# Patient Record
Sex: Female | Born: 1960 | Race: White | Hispanic: No | Smoking: Current every day smoker
Health system: Southern US, Community
[De-identification: ages and names within clinical notes are randomized; demographics above are authoritative.]

## PROBLEM LIST (undated history)

## (undated) ENCOUNTER — Emergency Department (HOSPITAL_COMMUNITY)
Admission: EM | Discharge status: Absent without leave | Payer: Self-pay | Source: Home / Self Care | Attending: Emergency Medicine | Admitting: Emergency Medicine

## (undated) DIAGNOSIS — IMO0002 Reserved for concepts with insufficient information to code with codable children: Secondary | ICD-10-CM

## (undated) DIAGNOSIS — M1990 Unspecified osteoarthritis, unspecified site: Secondary | ICD-10-CM

## (undated) DIAGNOSIS — J449 Chronic obstructive pulmonary disease, unspecified: Secondary | ICD-10-CM

## (undated) HISTORY — PX: TUBAL LIGATION: SHX77

---

## 1999-05-22 ENCOUNTER — Emergency Department (HOSPITAL_COMMUNITY): Admission: EM | Admit: 1999-05-22 | Discharge: 1999-05-22 | Payer: Self-pay | Admitting: Emergency Medicine

## 1999-08-07 ENCOUNTER — Encounter: Payer: Self-pay | Admitting: Emergency Medicine

## 1999-08-07 ENCOUNTER — Emergency Department (HOSPITAL_COMMUNITY): Admission: EM | Admit: 1999-08-07 | Discharge: 1999-08-07 | Payer: Self-pay | Admitting: Emergency Medicine

## 1999-09-08 ENCOUNTER — Emergency Department (HOSPITAL_COMMUNITY): Admission: EM | Admit: 1999-09-08 | Discharge: 1999-09-08 | Payer: Self-pay | Admitting: Emergency Medicine

## 2000-07-01 ENCOUNTER — Emergency Department (HOSPITAL_COMMUNITY): Admission: EM | Admit: 2000-07-01 | Discharge: 2000-07-01 | Payer: Self-pay | Admitting: *Deleted

## 2001-01-15 ENCOUNTER — Emergency Department (HOSPITAL_COMMUNITY): Admission: EM | Admit: 2001-01-15 | Discharge: 2001-01-15 | Payer: Self-pay | Admitting: Emergency Medicine

## 2004-03-31 ENCOUNTER — Emergency Department (HOSPITAL_COMMUNITY): Admission: EM | Admit: 2004-03-31 | Discharge: 2004-03-31 | Payer: Self-pay | Admitting: Emergency Medicine

## 2005-06-14 ENCOUNTER — Emergency Department (HOSPITAL_COMMUNITY): Admission: EM | Admit: 2005-06-14 | Discharge: 2005-06-14 | Payer: Self-pay | Admitting: Family Medicine

## 2007-01-21 ENCOUNTER — Emergency Department (HOSPITAL_COMMUNITY): Admission: EM | Admit: 2007-01-21 | Discharge: 2007-01-21 | Payer: Self-pay | Admitting: Emergency Medicine

## 2007-01-22 ENCOUNTER — Emergency Department (HOSPITAL_COMMUNITY): Admission: EM | Admit: 2007-01-22 | Discharge: 2007-01-22 | Payer: Self-pay | Admitting: Emergency Medicine

## 2007-10-21 ENCOUNTER — Emergency Department (HOSPITAL_COMMUNITY): Admission: EM | Admit: 2007-10-21 | Discharge: 2007-10-21 | Payer: Self-pay | Admitting: Emergency Medicine

## 2007-11-10 ENCOUNTER — Emergency Department (HOSPITAL_COMMUNITY): Admission: EM | Admit: 2007-11-10 | Discharge: 2007-11-10 | Payer: Self-pay | Admitting: Emergency Medicine

## 2009-07-25 ENCOUNTER — Emergency Department (HOSPITAL_COMMUNITY): Admission: EM | Admit: 2009-07-25 | Discharge: 2009-07-25 | Payer: Self-pay | Admitting: Emergency Medicine

## 2010-07-01 ENCOUNTER — Encounter: Payer: Self-pay | Admitting: Emergency Medicine

## 2010-09-20 ENCOUNTER — Inpatient Hospital Stay (INDEPENDENT_AMBULATORY_CARE_PROVIDER_SITE_OTHER)
Admission: RE | Admit: 2010-09-20 | Discharge: 2010-09-20 | Disposition: A | Discharge status: Home / Self Care | Payer: Self-pay | Source: Ambulatory Visit | Attending: Emergency Medicine | Admitting: Emergency Medicine

## 2010-09-20 DIAGNOSIS — S335XXA Sprain of ligaments of lumbar spine, initial encounter: Secondary | ICD-10-CM

## 2010-09-20 DIAGNOSIS — M62838 Other muscle spasm: Secondary | ICD-10-CM

## 2011-10-06 ENCOUNTER — Emergency Department (HOSPITAL_COMMUNITY)
Admission: EM | Admit: 2011-10-06 | Discharge: 2011-10-06 | Disposition: A | Discharge status: Home / Self Care | Payer: Self-pay | Attending: Emergency Medicine | Admitting: Emergency Medicine

## 2011-10-06 ENCOUNTER — Encounter (HOSPITAL_COMMUNITY): Payer: Self-pay | Admitting: *Deleted

## 2011-10-06 DIAGNOSIS — F17200 Nicotine dependence, unspecified, uncomplicated: Secondary | ICD-10-CM | POA: Insufficient documentation

## 2011-10-06 DIAGNOSIS — I8390 Asymptomatic varicose veins of unspecified lower extremity: Secondary | ICD-10-CM | POA: Insufficient documentation

## 2011-10-06 LAB — BASIC METABOLIC PANEL
BUN: 11 mg/dL (ref 6–23)
CO2: 28 mEq/L (ref 19–32)
Calcium: 9.7 mg/dL (ref 8.4–10.5)
Chloride: 102 mEq/L (ref 96–112)
Creatinine, Ser: 0.62 mg/dL (ref 0.50–1.10)
GFR calc Af Amer: >90 mL/min (ref >90)
GFR calc non Af Amer: >90 mL/min (ref >90)
Glucose, Bld: 95 mg/dL (ref 70–99)
Potassium: 3.7 mEq/L (ref 3.5–5.1)
Sodium: 138 mEq/L (ref 135–145)

## 2011-10-06 LAB — CBC
HCT: 44.7 % (ref 36.0–46.0)
Hemoglobin: 15.3 g/dL — ABNORMAL HIGH (ref 12.0–15.0)
MCH: 30.1 pg (ref 26.0–34.0)
MCHC: 34.2 g/dL (ref 30.0–36.0)
MCV: 87.8 fL (ref 78.0–100.0)
Platelets: 224 10*3/uL (ref 150–400)
RBC: 5.09 MIL/uL (ref 3.87–5.11)
RDW: 13.7 % (ref 11.5–15.5)
WBC: 9.3 10*3/uL (ref 4.0–10.5)

## 2011-10-06 LAB — GLUCOSE, CAPILLARY: Glucose-Capillary: 86 mg/dL (ref 70–99)

## 2011-10-06 LAB — D-DIMER, QUANTITATIVE: D-Dimer, Quant: 0.36 ug/mL-FEU (ref 0.00–0.48)

## 2011-10-06 MED ORDER — HYDROCODONE-ACETAMINOPHEN 5-325 MG PO TABS: 1.0000 | ORAL_TABLET | Freq: Four times a day (QID) | ORAL | Status: DC | PRN

## 2011-10-06 MED ORDER — OXYCODONE-ACETAMINOPHEN 5-325 MG PO TABS: ORAL_TABLET | ORAL | Status: DC

## 2011-10-06 NOTE — ED Notes (Signed)
Rash to right lower leg and hardened area to lateral right leg (which has been ongoing for years).

## 2011-10-06 NOTE — ED Provider Notes (Signed)
History     CSN: [id number]  Arrival date & time 10/06/11  1738   First MD Initiated Contact with Patient 10/06/11 1812      Chief Complaint  Patient presents with  . Leg Pain    (Consider location/radiation/quality/duration/timing/severity/associated sxs/prior treatment) HPI Comments: Pt had a lesion removed from R lower lateral leg ~ 2 yrs ago.  Has noted an adjacent area of swelling and PT and extreme itching of lower anterior leg.  When the old lesion area begins to hurt she also notes pain radiating to the knee area.  She denies any fever or chills.  No trauma States she can hardly feel anything in the lower anterior portion portion of her leg when she touches it.  No h/o DVT.  Strong family h/o cancer.  Denies CP, SOB or hemoptysis.  Is in process of getting established at [facility].  Patient is a 51 y.o. female presenting with leg pain. The history is provided by the patient. No language interpreter was used.  Leg Pain  Incident onset: several days ago. The incident occurred at home. There was no injury mechanism. Pain location: R lower leg  The pain is severe. The pain has been intermittent since onset. She reports no foreign bodies present. The symptoms are aggravated by palpation. She has tried nothing for the symptoms.    History reviewed. No pertinent past medical history.  Past Surgical History  Procedure Date  . Tubal ligation     No family history on file.  History  Substance Use Topics  . Smoking status: Current Everyday Smoker  . Smokeless tobacco: Not on file  . Alcohol Use: Yes    OB History    Grav Para Term Preterm Abortions TAB SAB Ect Mult Living                  Review of Systems  Constitutional: Negative for fever and chills.       Polydipsia  Respiratory: Negative for cough and shortness of breath.   Cardiovascular: Negative for chest pain.  Genitourinary: Negative for frequency.  All other systems reviewed and are  negative.    Allergies  Aspirin  Home Medications   Current Outpatient Rx  Name Route Sig Dispense Refill  . HYDROCODONE-ACETAMINOPHEN 5-325 MG PO TABS Oral Take 1 tablet by mouth every 6 (six) hours as needed for pain. 20 tablet 0    BP 142/96  Pulse 102  Temp(Src) 98.1 F (36.7 C) (Oral)  Resp 20  Ht 5\' 4"  (1.626 m)  SpO2 100%  Physical Exam  Nursing note and vitals reviewed. Constitutional: She is oriented to person, place, and time. She appears well-developed and well-nourished. No distress.  HENT:  Head: Normocephalic and atraumatic.  Eyes: EOM are normal.  Neck: Normal range of motion.  Cardiovascular: Normal rate, regular rhythm and normal heart sounds.   Pulmonary/Chest: Effort normal and breath sounds normal.  Abdominal: Soft. She exhibits no distension. There is no tenderness.  Musculoskeletal: Normal range of motion.       Legs: Neurological: She is alert and oriented to person, place, and time.  Skin: Skin is warm and dry.  Psychiatric: She has a normal mood and affect. Judgment normal.    ED Course  Procedures (including critical care time)  Labs Reviewed  CBC - Abnormal; Notable for the following:    Hemoglobin 15.3 (*)    All other components within normal limits  BASIC METABOLIC PANEL  D-DIMER, QUANTITATIVE  GLUCOSE, CAPILLARY  No results found.   1. Varicose vein of leg       MDM  No evidence of DVT.  rx for percocet, 20.  F/u with [facility].        Worthy Rancher, PA 10/06/11 2048  Worthy Rancher, PA 10/06/11 [phone]

## 2011-10-06 NOTE — ED Notes (Signed)
Pt a/ox4. Resp even and unlabored. NAD at this time. D/C instructions reviewed with pt. Pt verbalized understanding. Pt ambulated to lobby with steady gate.  

## 2011-10-06 NOTE — Discharge Instructions (Signed)
Apply ice to area several times daily.  Take the pain medicine as directed.  Follow through with getting established at [facility].

## 2011-10-09 NOTE — ED Provider Notes (Signed)
Medical screening examination/treatment/procedure(s) were performed by non-physician practitioner and as supervising physician I was immediately available for consultation/collaboration.  Tameko Halder S. Leidy Massar, MD 10/09/11 1415 

## 2012-01-29 ENCOUNTER — Encounter (HOSPITAL_COMMUNITY): Payer: Self-pay | Admitting: *Deleted

## 2012-01-29 ENCOUNTER — Emergency Department (HOSPITAL_COMMUNITY): Payer: Self-pay

## 2012-01-29 ENCOUNTER — Emergency Department (HOSPITAL_COMMUNITY)
Admission: EM | Admit: 2012-01-29 | Discharge: 2012-01-29 | Disposition: A | Discharge status: Home / Self Care | Payer: Self-pay | Attending: Emergency Medicine | Admitting: Emergency Medicine

## 2012-01-29 DIAGNOSIS — A5909 Other urogenital trichomoniasis: Secondary | ICD-10-CM | POA: Insufficient documentation

## 2012-01-29 DIAGNOSIS — N390 Urinary tract infection, site not specified: Secondary | ICD-10-CM | POA: Insufficient documentation

## 2012-01-29 DIAGNOSIS — A5903 Trichomonal cystitis and urethritis: Secondary | ICD-10-CM

## 2012-01-29 DIAGNOSIS — R109 Unspecified abdominal pain: Secondary | ICD-10-CM

## 2012-01-29 DIAGNOSIS — F17200 Nicotine dependence, unspecified, uncomplicated: Secondary | ICD-10-CM | POA: Insufficient documentation

## 2012-01-29 DIAGNOSIS — R197 Diarrhea, unspecified: Secondary | ICD-10-CM | POA: Insufficient documentation

## 2012-01-29 HISTORY — DX: Reserved for concepts with insufficient information to code with codable children: IMO0002

## 2012-01-29 LAB — COMPREHENSIVE METABOLIC PANEL WITH GFR
ALT: 10 U/L (ref 0–35)
AST: 17 U/L (ref 0–37)
Albumin: 3.8 g/dL (ref 3.5–5.2)
Alkaline Phosphatase: 78 U/L (ref 39–117)
BUN: 10 mg/dL (ref 6–23)
CO2: 28 meq/L (ref 19–32)
Calcium: 9.4 mg/dL (ref 8.4–10.5)
Chloride: 101 meq/L (ref 96–112)
Creatinine, Ser: 0.85 mg/dL (ref 0.50–1.10)
GFR calc Af Amer: >90 mL/min
GFR calc non Af Amer: 78 mL/min — ABNORMAL LOW
Glucose, Bld: 82 mg/dL (ref 70–99)
Potassium: 3.8 meq/L (ref 3.5–5.1)
Sodium: 137 meq/L (ref 135–145)
Total Bilirubin: 0.2 mg/dL — ABNORMAL LOW (ref 0.3–1.2)
Total Protein: 7.3 g/dL (ref 6.0–8.3)

## 2012-01-29 LAB — URINE MICROSCOPIC-ADD ON

## 2012-01-29 LAB — URINALYSIS, ROUTINE W REFLEX MICROSCOPIC
Bilirubin Urine: NEGATIVE
Glucose, UA: NEGATIVE mg/dL
Ketones, ur: NEGATIVE mg/dL
Nitrite: NEGATIVE
Protein, ur: NEGATIVE mg/dL
Specific Gravity, Urine: 1.015 (ref 1.005–1.030)
Urobilinogen, UA: 1 mg/dL (ref 0.0–1.0)
pH: 6.5 (ref 5.0–8.0)

## 2012-01-29 LAB — CBC WITH DIFFERENTIAL/PLATELET
Basophils Absolute: 0 10*3/uL (ref 0.0–0.1)
Basophils Relative: 1 % (ref 0–1)
Eosinophils Absolute: 0.3 10*3/uL (ref 0.0–0.7)
Eosinophils Relative: 4 % (ref 0–5)
HCT: 41.3 % (ref 36.0–46.0)
Hemoglobin: 14.1 g/dL (ref 12.0–15.0)
Lymphocytes Relative: 36 % (ref 12–46)
Lymphs Abs: 2.7 10*3/uL (ref 0.7–4.0)
MCH: 30 pg (ref 26.0–34.0)
MCHC: 34.1 g/dL (ref 30.0–36.0)
MCV: 87.9 fL (ref 78.0–100.0)
Monocytes Absolute: 0.8 10*3/uL (ref 0.1–1.0)
Monocytes Relative: 11 % (ref 3–12)
Neutro Abs: 3.6 10*3/uL (ref 1.7–7.7)
Neutrophils Relative %: 48 % (ref 43–77)
Platelets: 185 10*3/uL (ref 150–400)
RBC: 4.7 MIL/uL (ref 3.87–5.11)
RDW: 13 % (ref 11.5–15.5)
WBC: 7.5 10*3/uL (ref 4.0–10.5)

## 2012-01-29 MED ORDER — ONDANSETRON HCL 4 MG PO TABS: 4.0000 mg | ORAL_TABLET | Freq: Three times a day (TID) | ORAL | Status: AC | PRN

## 2012-01-29 MED ORDER — CEPHALEXIN 500 MG PO CAPS: 500.0000 mg | ORAL_CAPSULE | Freq: Four times a day (QID) | ORAL | Status: AC

## 2012-01-29 MED ORDER — METRONIDAZOLE 500 MG PO TABS
2000.0000 mg | ORAL_TABLET | Freq: Once | ORAL | Status: AC
  Administered 2012-01-29: 2000 mg via ORAL
  Filled 2012-01-29: qty 4

## 2012-01-29 MED ORDER — SODIUM CHLORIDE 0.9 % IV SOLN
INTRAVENOUS | Status: DC
  Administered 2012-01-29: 20:00:00 via INTRAVENOUS

## 2012-01-29 MED ORDER — ONDANSETRON HCL 4 MG/2ML IJ SOLN
4.0000 mg | INTRAMUSCULAR | Status: DC | PRN
  Administered 2012-01-29: 4 mg via INTRAVENOUS
  Filled 2012-01-29: qty 2

## 2012-01-29 MED ORDER — FAMOTIDINE IN NACL 20-0.9 MG/50ML-% IV SOLN
20.0000 mg | Freq: Once | INTRAVENOUS | Status: AC
  Administered 2012-01-29: 20 mg via INTRAVENOUS
  Filled 2012-01-29: qty 50

## 2012-01-29 MED ORDER — IOHEXOL 300 MG/ML  SOLN
100.0000 mL | Freq: Once | INTRAMUSCULAR | Status: AC | PRN
  Administered 2012-01-29: 100 mL via INTRAVENOUS

## 2012-01-29 NOTE — ED Notes (Signed)
Pt reporting improvement in abdominal cramping and nausea.  Pt tolerated both bottles of CT contrast with no difficulty.

## 2012-01-29 NOTE — ED Notes (Signed)
Hemoccult test negative.  Physician aware.

## 2012-01-29 NOTE — ED Provider Notes (Signed)
History     CSN: [id number]  Arrival date & time 01/29/12  1732   First MD Initiated Contact with Patient 01/29/12 1918      Chief Complaint  Patient presents with  . Abdominal Pain    HPI Pt was seen at 1945.  Per pt, c/o gradual onset and persistence of constant abd "pain" for the past 3 days.  Describes the pain as diffuse "cramping," especially in her upper abd.  Has been associated with nausea and several intermittent episodes of "watery" diarrhea.  States she has not been eating or drinking because it worsens her upper abd pain, cramping, and diarrhea.  Denies vomiting, no CP/SOB, no back pain, no dysuria, no black or blood in stools, no fevers, no rash, no sick contacts with same, no recent travel or antibiotic use.   Past Medical History  Diagnosis Date  . Ulcer     Past Surgical History  Procedure Date  . Tubal ligation     History  Substance Use Topics  . Smoking status: Current Everyday Smoker  . Smokeless tobacco: Not on file  . Alcohol Use: No    Review of Systems ROS: Statement: All systems negative except as marked or noted in the HPI; Constitutional: Negative for fever and chills. ; ; Eyes: Negative for eye pain, redness and discharge. ; ; ENMT: Negative for ear pain, hoarseness, nasal congestion, sinus pressure and sore throat. ; ; Cardiovascular: Negative for chest pain, palpitations, diaphoresis, dyspnea and peripheral edema. ; ; Respiratory: Negative for cough, wheezing and stridor. ; ; Gastrointestinal: +Nausea, diarrhea, abd pain. Negative for vomiting, blood in stool, hematemesis, jaundice and rectal bleeding. . ; ; Genitourinary: Negative for dysuria, flank pain and hematuria. ; ; Musculoskeletal: Negative for back pain and neck pain. Negative for swelling and trauma.; ; Skin: Negative for pruritus, rash, abrasions, blisters, bruising and skin lesion.; ; Neuro: Negative for headache, lightheadedness and neck stiffness. Negative for weakness, altered level of  consciousness , altered mental status, extremity weakness, paresthesias, involuntary movement, seizure and syncope.     Allergies  Aspirin  Home Medications  No current outpatient prescriptions on file.  BP 101/72  Pulse 82  Temp 98.1 F (36.7 C) (Oral)  Resp 20  Ht 5\' 4"  (1.626 m)  Wt 135 lb (61.236 kg)  BMI 23.17 kg/m2  SpO2 97%  Physical Exam 1950: Physical examination:  Nursing notes reviewed; Vital signs and O2 SAT reviewed;  Constitutional: Well developed, Well nourished, Well hydrated, In no acute distress; Head:  Normocephalic, atraumatic; Eyes: EOMI, PERRL, No scleral icterus; ENMT: Mouth and pharynx normal, Mucous membranes moist; Neck: Supple, Full range of motion, No lymphadenopathy; Cardiovascular: Regular rate and rhythm, No murmur, rub, or gallop; Respiratory: Breath sounds clear & equal bilaterally, No rales, rhonchi, wheezes.  Speaking full sentences with ease, Normal respiratory effort/excursion; Chest: Nontender, Movement normal; Abdomen: Soft, +mild diffuse tenderness to palp.  No rebound or guarding. Nondistended, Normal bowel sounds; Genitourinary: No CVA tenderness; Extremities: Pulses normal, No tenderness, No edema, No calf edema or asymmetry.; Neuro: AA&Ox3, Major CN grossly intact.  Speech clear. No gross focal motor or sensory deficits in extremities.; Skin: Color normal, Warm, Dry.   ED Course  Procedures   MDM  MDM Reviewed: nursing note, vitals and previous chart Interpretation: labs and CT scan   Stool heme negative.  Results for orders placed during the hospital encounter of 01/29/12  CBC WITH DIFFERENTIAL      Component Value Range   WBC  7.5  4.0 - 10.5 K/uL   RBC 4.70  3.87 - 5.11 MIL/uL   Hemoglobin 14.1  12.0 - 15.0 g/dL   HCT 96.0  45.4 - 09.8 %   MCV 87.9  78.0 - 100.0 fL   MCH 30.0  26.0 - 34.0 pg   MCHC 34.1  30.0 - 36.0 g/dL   RDW 11.9  14.7 - 82.9 %   Platelets 185  150 - 400 K/uL   Neutrophils Relative 48  43 - 77 %   Neutro  Abs 3.6  1.7 - 7.7 K/uL   Lymphocytes Relative 36  12 - 46 %   Lymphs Abs 2.7  0.7 - 4.0 K/uL   Monocytes Relative 11  3 - 12 %   Monocytes Absolute 0.8  0.1 - 1.0 K/uL   Eosinophils Relative 4  0 - 5 %   Eosinophils Absolute 0.3  0.0 - 0.7 K/uL   Basophils Relative 1  0 - 1 %   Basophils Absolute 0.0  0.0 - 0.1 K/uL  COMPREHENSIVE METABOLIC PANEL      Component Value Range   Sodium 137  135 - 145 mEq/L   Potassium 3.8  3.5 - 5.1 mEq/L   Chloride 101  96 - 112 mEq/L   CO2 28  19 - 32 mEq/L   Glucose, Bld 82  70 - 99 mg/dL   BUN 10  6 - 23 mg/dL   Creatinine, Ser 5.62  0.50 - 1.10 mg/dL   Calcium 9.4  8.4 - 13.0 mg/dL   Total Protein 7.3  6.0 - 8.3 g/dL   Albumin 3.8  3.5 - 5.2 g/dL   AST 17  0 - 37 U/L   ALT 10  0 - 35 U/L   Alkaline Phosphatase 78  39 - 117 U/L   Total Bilirubin 0.2 (*) 0.3 - 1.2 mg/dL   GFR calc non Af Amer 78 (*) >90 mL/min   GFR calc Af Amer >90  >90 mL/min  LIPASE, BLOOD      Component Value Range   Lipase 14  11 - 59 U/L  URINALYSIS, ROUTINE W REFLEX MICROSCOPIC      Component Value Range   Color, Urine AMBER (*) YELLOW   APPearance CLEAR  CLEAR   Specific Gravity, Urine 1.015  1.005 - 1.030   pH 6.5  5.0 - 8.0   Glucose, UA NEGATIVE  NEGATIVE mg/dL   Hgb urine dipstick TRACE (*) NEGATIVE   Bilirubin Urine NEGATIVE  NEGATIVE   Ketones, ur NEGATIVE  NEGATIVE mg/dL   Protein, ur NEGATIVE  NEGATIVE mg/dL   Urobilinogen, UA 1.0  0.0 - 1.0 mg/dL   Nitrite NEGATIVE  NEGATIVE   Leukocytes, UA SMALL (*) NEGATIVE  URINE MICROSCOPIC-ADD ON      Component Value Range   Squamous Epithelial / LPF MANY (*) RARE   WBC, UA 11-20  <3 WBC/hpf   RBC / HPF 7-10  <3 RBC/hpf   Bacteria, UA FEW (*) RARE   Urine-Other TRICHOMONAS PRESENT     Ct Abdomen Pelvis W Contrast 01/29/2012  *RADIOLOGY REPORT*  Clinical Data: Abdominal pain, cramping, and diarrhea for 3 days. History of ulcer.  CT ABDOMEN AND PELVIS WITH CONTRAST  Technique:  Multidetector CT imaging of the  abdomen and pelvis was performed following the standard protocol during bolus administration of intravenous contrast.  Contrast: OMNIPAQUE IOHEXOL 300 MG/ML  SOLN  Comparison: None.  Findings: Minimal dependent changes in the lung bases.  Mild emphysematous changes in the lung bases.  Small esophageal hiatal hernia.  Small low attenuation lesions in the lateral segment left lobe of liver and posterior right lobe of liver, largest measuring 12 mm diameter.  These are somewhat poorly defined appear to fill in on delayed imaging, likely representing small hemangiomas.  The gallbladder, spleen, pancreas, adrenal glands, and inferior vena cava are unremarkable.  Sub centimeter cysts in the kidneys.  No solid mass or hydronephrosis appreciated.  Calcification of the abdominal aorta without aneurysm.  Moderate prominent retroperitoneal lymph nodes without pathologic enlargement, likely reactive.  The stomach, small bowel, and colon are not abnormally distended and no discrete wall thickening is appreciated. Prominent visceral adipose tissues.  No free air or free fluid in the abdomen.  Pelvis:  Uterus and adnexal structures are not enlarged.  Bladder wall is not thickened.  No free or loculated pelvic fluid collections.  No evidence of diverticulitis.  The appendix is normal.  The mild degenerative changes in the lumbar spine.  IMPRESSION: Mild prominence of mesenteric and retroperitoneal lymph nodes, likely to be reactive.  Small hypodense liver lesions likely representing small hemangiomas.  No acute process demonstrated in the abdomen or pelvis.   Original Report Authenticated By: Marlon Pel, M.D.      2230:  Has tol PO well while in the ED without N/V.  No stooling while in the ED.  States she is better and ready to leave now.  Will tx for trichomonas today; pt encouraged to f/u with [facility] and/or OB/GYN for further STD testing.  Dx testing d/w pt.  Questions answered.  Verb understanding,  agreeable to d/c home with outpt f/u.        Laray Anger, DO 02/01/12 0930

## 2012-01-29 NOTE — ED Notes (Signed)
Pt c/o pain in her abdomen x 3 days. Pt denies nausea and vomiting. States that she is having diarrhea. Denies fever.

## 2012-01-29 NOTE — ED Notes (Signed)
Pt reporting abdominal cramping and diarrhea x4 days.  Reporting mild nausea, no vomiting.

## 2012-01-30 LAB — URINE CULTURE: Colony Count: 40000

## 2012-02-25 ENCOUNTER — Encounter (HOSPITAL_COMMUNITY): Payer: Self-pay | Admitting: Emergency Medicine

## 2012-02-25 ENCOUNTER — Emergency Department (HOSPITAL_COMMUNITY)
Admission: EM | Admit: 2012-02-25 | Discharge: 2012-02-25 | Disposition: A | Discharge status: Home / Self Care | Payer: Self-pay | Attending: Emergency Medicine | Admitting: Emergency Medicine

## 2012-02-25 DIAGNOSIS — Z91018 Allergy to other foods: Secondary | ICD-10-CM | POA: Insufficient documentation

## 2012-02-25 DIAGNOSIS — M549 Dorsalgia, unspecified: Secondary | ICD-10-CM | POA: Insufficient documentation

## 2012-02-25 DIAGNOSIS — F17200 Nicotine dependence, unspecified, uncomplicated: Secondary | ICD-10-CM | POA: Insufficient documentation

## 2012-02-25 DIAGNOSIS — Z888 Allergy status to other drugs, medicaments and biological substances status: Secondary | ICD-10-CM | POA: Insufficient documentation

## 2012-02-25 MED ORDER — HYDROCODONE-ACETAMINOPHEN 5-325 MG PO TABS: ORAL_TABLET | ORAL | Status: DC

## 2012-02-25 MED ORDER — BACLOFEN 10 MG PO TABS: 10.0000 mg | ORAL_TABLET | Freq: Three times a day (TID) | ORAL | Status: AC

## 2012-02-25 NOTE — ED Provider Notes (Signed)
Medical screening examination/treatment/procedure(s) were performed by non-physician practitioner and as supervising physician I was immediately available for consultation/collaboration.  Shelda Jakes, MD 02/25/12 1239

## 2012-02-25 NOTE — ED Provider Notes (Signed)
History     CSN: [id number]  Arrival date & time 02/25/12  1036   First MD Initiated Contact with Patient 02/25/12 1219      Chief Complaint  Patient presents with  . Back Pain    (Consider location/radiation/quality/duration/timing/severity/associated sxs/prior treatment) Patient is a 51 y.o. female presenting with back pain. The history is provided by the patient.  Back Pain  This is a chronic problem. The current episode started more than 2 days ago. The problem occurs daily. The problem has been gradually worsening. Associated with: riding a mower, she hit several roots that jarrd the lower back. The pain is present in the lumbar spine. The quality of the pain is described as aching. The pain is severe. The symptoms are aggravated by certain positions. The pain is the same all the time. Associated symptoms include abdominal pain. Pertinent negatives include no chest pain, no fever, no bowel incontinence, no perianal numbness, no bladder incontinence and no dysuria. She has tried analgesics for the symptoms. The treatment provided no relief.    Past Medical History  Diagnosis Date  . Ulcer     Past Surgical History  Procedure Date  . Tubal ligation     History reviewed. No pertinent family history.  History  Substance Use Topics  . Smoking status: Current Every Day Smoker  . Smokeless tobacco: Not on file  . Alcohol Use: No    OB History    Grav Para Term Preterm Abortions TAB SAB Ect Mult Living                  Review of Systems  Constitutional: Negative for fever and activity change.       All ROS Neg except as noted in HPI  HENT: Negative for nosebleeds and neck pain.   Eyes: Negative for photophobia and discharge.  Respiratory: Positive for wheezing. Negative for cough and shortness of breath.   Cardiovascular: Negative for chest pain and palpitations.  Gastrointestinal: Positive for abdominal pain. Negative for blood in stool and bowel incontinence.    Genitourinary: Negative for bladder incontinence, dysuria, frequency and hematuria.  Musculoskeletal: Positive for back pain. Negative for arthralgias.  Skin: Negative.   Neurological: Negative for dizziness, seizures and speech difficulty.  Psychiatric/Behavioral: Negative for hallucinations and confusion.    Allergies  Aspirin and Mushroom extract complex  Home Medications   Current Outpatient Rx  Name Route Sig Dispense Refill  . ACETAMINOPHEN 500 MG PO TABS Oral Take 1,000 mg by mouth every 6 (six) hours as needed. Pain      BP 111/69  Pulse 84  Temp 97.9 F (36.6 C) (Oral)  Resp 18  Ht 5\' 4"  (1.626 m)  Wt 130 lb (58.968 kg)  BMI 22.31 kg/m2  SpO2 100%  Physical Exam  Nursing note and vitals reviewed. Constitutional: She is oriented to person, place, and time. She appears well-developed and well-nourished.  Non-toxic appearance.  HENT:  Head: Normocephalic.  Right Ear: Tympanic membrane and external ear normal.  Left Ear: Tympanic membrane and external ear normal.  Eyes: EOM and lids are normal. Pupils are equal, round, and reactive to light.  Neck: Normal range of motion. Neck supple. Carotid bruit is not present.  Cardiovascular: Normal rate, regular rhythm, normal heart sounds, intact distal pulses and normal pulses.   Pulmonary/Chest: Breath sounds normal. No respiratory distress.  Abdominal: Soft. Bowel sounds are normal. There is no tenderness. There is no guarding.  Musculoskeletal: Normal range of motion.  The patient has pain in the lower back when attempting to straighten her back. There is minimal discomfort to palpation. There is major discomfort with attempted range of motion particularly of the lumbar region. There is no palpable step off.  Lymphadenopathy:       Head (right side): No submandibular adenopathy present.       Head (left side): No submandibular adenopathy present.    She has no cervical adenopathy.  Neurological: She is alert and  oriented to person, place, and time. She has normal strength. No cranial nerve deficit or sensory deficit. She exhibits normal muscle tone. Coordination normal.       No gross motor or sensory deficits appreciated.  Skin: Skin is warm and dry.  Psychiatric: She has a normal mood and affect. Her speech is normal.    ED Course  Procedures (including critical care time)  Labs Reviewed - No data to display No results found.   No diagnosis found.    MDM  I have reviewed nursing notes, vital signs, and all appropriate lab and imaging results for this patient. Patient has a history of chronic back pain that was exacerbated by doing yard work and by riding a Surveyor, mining over several roots in the neighbors yard. The patient denies any loss of bowel or bladder function. She's not had a recent fall or other injuries. The plan at this time is for the patient to be treated with baclofen 3 times daily, and Norco every 4 hours as needed for pain #20 tablets. Patient has been advised to see her primary physician if not improving.       Kathie Dike, [place] 02/25/12 1231

## 2012-02-25 NOTE — ED Notes (Signed)
Pt c/o of back pain the increases with movement. Denies falling but recalls mowing grass on Sept 15, where there were a lot of "ruts" in the yard. The riding mower went down and popped back up hitting her per patient.  Could not pick up granddaughter this morning when attempting pain shot up back. No hx of back pain or surgeries.

## 2012-03-30 ENCOUNTER — Emergency Department (HOSPITAL_COMMUNITY): Payer: Self-pay

## 2012-03-30 ENCOUNTER — Emergency Department (HOSPITAL_COMMUNITY)
Admission: EM | Admit: 2012-03-30 | Discharge: 2012-03-30 | Disposition: A | Discharge status: Home / Self Care | Payer: Self-pay | Attending: Emergency Medicine | Admitting: Emergency Medicine

## 2012-03-30 ENCOUNTER — Encounter (HOSPITAL_COMMUNITY): Payer: Self-pay | Admitting: *Deleted

## 2012-03-30 DIAGNOSIS — Y939 Activity, unspecified: Secondary | ICD-10-CM | POA: Insufficient documentation

## 2012-03-30 DIAGNOSIS — Y929 Unspecified place or not applicable: Secondary | ICD-10-CM | POA: Insufficient documentation

## 2012-03-30 DIAGNOSIS — S61019A Laceration without foreign body of unspecified thumb without damage to nail, initial encounter: Secondary | ICD-10-CM

## 2012-03-30 DIAGNOSIS — Z23 Encounter for immunization: Secondary | ICD-10-CM | POA: Insufficient documentation

## 2012-03-30 DIAGNOSIS — F17200 Nicotine dependence, unspecified, uncomplicated: Secondary | ICD-10-CM | POA: Insufficient documentation

## 2012-03-30 DIAGNOSIS — W260XXA Contact with knife, initial encounter: Secondary | ICD-10-CM | POA: Insufficient documentation

## 2012-03-30 DIAGNOSIS — Z8719 Personal history of other diseases of the digestive system: Secondary | ICD-10-CM | POA: Insufficient documentation

## 2012-03-30 DIAGNOSIS — S61209A Unspecified open wound of unspecified finger without damage to nail, initial encounter: Secondary | ICD-10-CM | POA: Insufficient documentation

## 2012-03-30 MED ORDER — HYDROCODONE-ACETAMINOPHEN 5-325 MG PO TABS: ORAL_TABLET | ORAL | Status: DC

## 2012-03-30 MED ORDER — TETANUS-DIPHTH-ACELL PERTUSSIS 5-2.5-18.5 LF-MCG/0.5 IM SUSP
INTRAMUSCULAR | Status: AC
  Administered 2012-03-30: 0.5 mL via INTRAMUSCULAR
  Filled 2012-03-30: qty 0.5

## 2012-03-30 MED ORDER — BUPIVACAINE HCL (PF) 0.25 % IJ SOLN
INTRAMUSCULAR | Status: AC
  Administered 2012-03-30: 16:00:00
  Filled 2012-03-30: qty 30

## 2012-03-30 NOTE — ED Notes (Signed)
Lac to lt thumb with carpet knife, 30 min pta.

## 2012-03-30 NOTE — ED Notes (Signed)
Pt had cut self by accident to left thumb with a new blade to a "carpet knife", occurred about 45 minutes ago per pt., unknown of last tetanus shot

## 2012-03-30 NOTE — ED Provider Notes (Signed)
History     CSN: [id number]  Arrival date & time 03/30/12  1345   First MD Initiated Contact with Patient 03/30/12 1502      Chief Complaint  Patient presents with  . Laceration    (Consider location/radiation/quality/duration/timing/severity/associated sxs/prior treatment) Patient is a 51 y.o. female presenting with skin laceration. The history is provided by the patient.  Laceration  The incident occurred less than 1 hour ago. The laceration is located on the left hand. The laceration is 2 cm in size. The laceration mechanism was a a clean knife. The pain is severe. The pain has been constant since onset. She reports no foreign bodies present. Her tetanus status is out of date.    Past Medical History  Diagnosis Date  . Ulcer     Past Surgical History  Procedure Date  . Tubal ligation     History reviewed. No pertinent family history.  History  Substance Use Topics  . Smoking status: Current Every Day Smoker  . Smokeless tobacco: Not on file  . Alcohol Use: No    OB History    Grav Para Term Preterm Abortions TAB SAB Ect Mult Living                  Review of Systems  Constitutional: Negative for activity change.       All ROS Neg except as noted in HPI  HENT: Negative for nosebleeds and neck pain.   Eyes: Negative for photophobia and discharge.  Respiratory: Positive for wheezing. Negative for cough and shortness of breath.   Cardiovascular: Negative for chest pain and palpitations.  Gastrointestinal: Positive for abdominal pain. Negative for blood in stool.  Genitourinary: Negative for dysuria, frequency and hematuria.  Musculoskeletal: Negative for back pain and arthralgias.  Skin: Negative.   Neurological: Negative for dizziness, seizures and speech difficulty.  Psychiatric/Behavioral: Negative for hallucinations and confusion.    Allergies  Aspirin and Mushroom extract complex  Home Medications   Current Outpatient Rx  Name Route Sig Dispense  Refill  . HYDROCODONE-ACETAMINOPHEN 5-325 MG PO TABS  1 or 2 po q4h prn pain 15 tablet 0    BP 112/80  Pulse 112  Temp 98.2 F (36.8 C) (Oral)  Resp 20  Ht 5\' 4"  (1.626 m)  Wt 147 lb 8 oz (66.906 kg)  BMI 25.32 kg/m2  SpO2 100%  Physical Exam  Nursing note and vitals reviewed. Constitutional: She is oriented to person, place, and time. She appears well-developed and well-nourished.  Non-toxic appearance.  HENT:  Head: Normocephalic.  Right Ear: Tympanic membrane and external ear normal.  Left Ear: Tympanic membrane and external ear normal.  Eyes: EOM and lids are normal. Pupils are equal, round, and reactive to light.  Neck: Normal range of motion. Neck supple. Carotid bruit is not present.  Cardiovascular: Normal rate, regular rhythm, normal heart sounds, intact distal pulses and normal pulses.   Pulmonary/Chest: Breath sounds normal. No respiratory distress.  Abdominal: Soft. Bowel sounds are normal. There is no tenderness. There is no guarding.  Musculoskeletal: Normal range of motion.       Patient has a laceration to the dorsum of the left thumb just above the MP joint. There is no bone or tendon involvement. Sensory is intact to touch and pain.  Lymphadenopathy:       Head (right side): No submandibular adenopathy present.       Head (left side): No submandibular adenopathy present.    She has no cervical  adenopathy.  Neurological: She is alert and oriented to person, place, and time. She has normal strength. No cranial nerve deficit or sensory deficit.       No gross motor or sensory deficits of the upper extremity.  Skin: Skin is warm and dry.  Psychiatric: She has a normal mood and affect. Her speech is normal.    ED Course  Procedures : LACERATION REPAIR LEFT THUMB - patient identified by arm band. Permission for the procedure given by the patient. Procedural time out taken before repair of laceration to the left thumb. The thumb was painted with Betadine. Digital  block was carried out with 0.25% plain Sensorcaine. After adequate anesthesia the wound was irrigated with saline. The wound was then inspected. There was no bone or tendon involvement. No foreign body noted. The wound was then repaired with 5 interrupted sutures of 4-0 nylon. With good wound edge approximation. The range of motion was rechecked after the wound was repaired and found to be intact. Sterile dressing applied, thumb splint applied. Patient status was updated. Patient tolerated the procedure without any problem or complication.  Labs Reviewed - No data to display Dg Finger Thumb Left  03/30/2012  *RADIOLOGY REPORT*  Clinical Data: Laceration and pain.  LEFT THUMB 2+V  Comparison: None.  Findings: No acute osseous or joint abnormality.  No radiopaque foreign body.  IMPRESSION: No acute osseous or joint abnormality.  No radiopaque foreign body.   Original Report Authenticated By: Reyes Ivan, M.D.      1. Laceration of thumb       MDM  I have reviewed nursing notes, vital signs, and all appropriate lab and imaging results for this patient. The x-ray of the left thumb is negative for foreign body or bony involvement. The laceration to the left palm was repaired without problem. Patient was placed in a splint. Patient advised to have the sutures removed in 7-8 days. She is to keep the wound clean and dry. Prescription for Norco given for pain. Patient advised to return sooner if any signs of infection. Patient voices understanding of instructions.       Kathie Dike, [place] 03/30/12 1555

## 2012-04-01 NOTE — ED Provider Notes (Signed)
Medical screening examination/treatment/procedure(s) were performed by non-physician practitioner and as supervising physician I was immediately available for consultation/collaboration.  John-Adam Brennon Otterness, M.D.     John-Adam Terease Marcotte, MD 04/01/12 0159 

## 2012-04-07 ENCOUNTER — Encounter (HOSPITAL_COMMUNITY): Payer: Self-pay | Admitting: *Deleted

## 2012-04-07 ENCOUNTER — Emergency Department (HOSPITAL_COMMUNITY)
Admission: EM | Admit: 2012-04-07 | Discharge: 2012-04-07 | Disposition: A | Discharge status: Home / Self Care | Payer: Self-pay | Attending: Emergency Medicine | Admitting: Emergency Medicine

## 2012-04-07 DIAGNOSIS — Z4802 Encounter for removal of sutures: Secondary | ICD-10-CM | POA: Insufficient documentation

## 2012-04-07 DIAGNOSIS — F17200 Nicotine dependence, unspecified, uncomplicated: Secondary | ICD-10-CM | POA: Insufficient documentation

## 2012-04-07 DIAGNOSIS — R062 Wheezing: Secondary | ICD-10-CM | POA: Insufficient documentation

## 2012-04-07 DIAGNOSIS — IMO0001 Reserved for inherently not codable concepts without codable children: Secondary | ICD-10-CM | POA: Insufficient documentation

## 2012-04-07 DIAGNOSIS — Z8711 Personal history of peptic ulcer disease: Secondary | ICD-10-CM | POA: Insufficient documentation

## 2012-04-07 MED ORDER — BACITRACIN-NEOMYCIN-POLYMYXIN 400-5-5000 EX OINT
TOPICAL_OINTMENT | CUTANEOUS | Status: AC
  Administered 2012-04-07: 17:00:00
  Filled 2012-04-07: qty 1

## 2012-04-07 NOTE — ED Notes (Signed)
Here for recheck of lt hand sutures , and  Says her lt arm and shoulder hurt.

## 2012-04-07 NOTE — ED Notes (Signed)
4 sutures removed without difficulty. Pt tolerated well. Steri strips applied with band-aid. No infection noted.

## 2012-04-07 NOTE — ED Provider Notes (Signed)
History     CSN: [id number]  Arrival date & time 04/07/12  1553   First MD Initiated Contact with Patient 04/07/12 1629      Chief Complaint  Patient presents with  . Wound Check    (Consider location/radiation/quality/duration/timing/severity/associated sxs/prior treatment) Patient is a 51 y.o. female presenting with wound check. The history is provided by the patient.  Wound Check  She was treated in the ED 5 to 10 days ago. Previous treatment in the ED includes laceration repair. Treatments since wound repair include antibiotic ointment use. Fever duration: none. There has been no drainage from the wound. There is no redness present. There is no swelling present. She has no difficulty moving the affected extremity or digit.    Past Medical History  Diagnosis Date  . Ulcer     Past Surgical History  Procedure Date  . Tubal ligation     History reviewed. No pertinent family history.  History  Substance Use Topics  . Smoking status: Current Every Day Smoker  . Smokeless tobacco: Not on file  . Alcohol Use: No    OB History    Grav Para Term Preterm Abortions TAB SAB Ect Mult Living                  Review of Systems  Constitutional: Negative for activity change.       All ROS Neg except as noted in HPI  HENT: Negative for nosebleeds and neck pain.   Eyes: Negative for photophobia and discharge.  Respiratory: Positive for wheezing. Negative for cough and shortness of breath.   Cardiovascular: Negative for chest pain and palpitations.  Gastrointestinal: Negative for abdominal pain and blood in stool.  Genitourinary: Negative for dysuria, frequency and hematuria.  Musculoskeletal: Positive for myalgias and arthralgias. Negative for back pain.  Skin: Positive for wound.  Neurological: Negative for dizziness, seizures and speech difficulty.  Psychiatric/Behavioral: Negative for hallucinations and confusion.    Allergies  Aspirin and Mushroom extract  complex  Home Medications   Current Outpatient Rx  Name Route Sig Dispense Refill  . HYDROCODONE-ACETAMINOPHEN 5-325 MG PO TABS  1 or 2 po q4h prn pain 15 tablet 0    BP 113/74  Pulse 88  Temp 98.5 F (36.9 C) (Oral)  Resp 18  Ht 5\' 4"  (1.626 m)  Wt 147 lb (66.679 kg)  BMI 25.23 kg/m2  SpO2 98%  Physical Exam  Nursing note and vitals reviewed. Constitutional: She is oriented to person, place, and time. She appears well-developed and well-nourished.  Non-toxic appearance.  HENT:  Head: Normocephalic.  Right Ear: Tympanic membrane and external ear normal.  Left Ear: Tympanic membrane and external ear normal.  Eyes: EOM and lids are normal. Pupils are equal, round, and reactive to light.  Neck: Normal range of motion. Neck supple. Carotid bruit is not present.  Cardiovascular: Normal rate, regular rhythm, normal heart sounds, intact distal pulses and normal pulses.   Pulmonary/Chest: Breath sounds normal. No respiratory distress.  Abdominal: Soft. Bowel sounds are normal. There is no tenderness. There is no guarding.  Musculoskeletal: Normal range of motion.       Soreness of the left shoulder with ROM. The wound to the left thumb has healed nicely. No drainage or red streaking. The area is not hot. The pt moves the thumb with stiffness, but has ROM. She c/o decrease sensation of the left thumb.  Lymphadenopathy:       Head (right side): No submandibular adenopathy present.  Head (left side): No submandibular adenopathy present.    She has no cervical adenopathy.  Neurological: She is alert and oriented to person, place, and time. She has normal strength. No cranial nerve deficit or sensory deficit.  Skin: Skin is warm and dry.  Psychiatric: She has a normal mood and affect. Her speech is normal.    ED Course  Procedures (including critical care time)  Labs Reviewed - No data to display No results found.   No diagnosis found.    MDM  I have reviewed nursing  notes, vital signs, and all appropriate lab and imaging results for this patient. Sutures removed without problem. Pt c/o decrease sensation and sensation of 1st and 2nd finger being "half asleep". She also c/o left shoulder pain following carrying her granddaughter. Pt referred to hand orthopedic specialist for evaluation.       Kathie Dike, [place] 04/07/12 [phone]

## 2012-04-09 NOTE — ED Provider Notes (Signed)
Medical screening examination/treatment/procedure(s) were performed by non-physician practitioner and as supervising physician I was immediately available for consultation/collaboration.   Laray Anger, DO 04/09/12 1205

## 2012-10-30 ENCOUNTER — Encounter (HOSPITAL_COMMUNITY): Payer: Self-pay | Admitting: Emergency Medicine

## 2012-10-30 ENCOUNTER — Emergency Department (HOSPITAL_COMMUNITY)
Admission: EM | Admit: 2012-10-30 | Discharge: 2012-10-30 | Disposition: A | Discharge status: Home / Self Care | Payer: Self-pay | Attending: Emergency Medicine | Admitting: Emergency Medicine

## 2012-10-30 DIAGNOSIS — Y929 Unspecified place or not applicable: Secondary | ICD-10-CM | POA: Insufficient documentation

## 2012-10-30 DIAGNOSIS — R110 Nausea: Secondary | ICD-10-CM | POA: Insufficient documentation

## 2012-10-30 DIAGNOSIS — Z8719 Personal history of other diseases of the digestive system: Secondary | ICD-10-CM | POA: Insufficient documentation

## 2012-10-30 DIAGNOSIS — W57XXXA Bitten or stung by nonvenomous insect and other nonvenomous arthropods, initial encounter: Secondary | ICD-10-CM | POA: Insufficient documentation

## 2012-10-30 DIAGNOSIS — T148 Other injury of unspecified body region: Secondary | ICD-10-CM | POA: Insufficient documentation

## 2012-10-30 DIAGNOSIS — Y939 Activity, unspecified: Secondary | ICD-10-CM | POA: Insufficient documentation

## 2012-10-30 DIAGNOSIS — F17200 Nicotine dependence, unspecified, uncomplicated: Secondary | ICD-10-CM | POA: Insufficient documentation

## 2012-10-30 MED ORDER — ONDANSETRON 8 MG PO TBDP: 8.0000 mg | ORAL_TABLET | Freq: Three times a day (TID) | ORAL | Status: DC | PRN

## 2012-10-30 MED ORDER — DOXYCYCLINE HYCLATE 100 MG PO TABS: 100.0000 mg | ORAL_TABLET | Freq: Two times a day (BID) | ORAL | Status: DC

## 2012-10-30 NOTE — ED Provider Notes (Signed)
History     CSN: [id number]  Arrival date & time 10/30/12  1514   First MD Initiated Contact with Patient 10/30/12 1519     Chief complaint: Tick bite, myalgias, joint aches   HPI Pt found a tick bite about 5 days ago.  She found another one a couple days later.  2 days ago she started having general malaise.  She took her temperature and it was 99.8.  She feels that her joints are aching.  She no vomiting or diarrhea.  She has had some nausea.  Some headaches.  No rash.  Slight sore throat and some cough.   Past Medical History  Diagnosis Date  . Ulcer     Past Surgical History  Procedure Laterality Date  . Tubal ligation      No family history on file.  History  Substance Use Topics  . Smoking status: Current Every Day Smoker  . Smokeless tobacco: Not on file  . Alcohol Use: No    OB History   Grav Para Term Preterm Abortions TAB SAB Ect Mult Living                  Review of Systems  All other systems reviewed and are negative.    Allergies  Aspirin and Mushroom extract complex  Home Medications   Current Outpatient Rx  Name  Route  Sig  Dispense  Refill  . acetaminophen (TYLENOL) 500 MG tablet   Oral   Take 1,000 mg by mouth every 6 (six) hours as needed for pain or fever.         . doxycycline (VIBRA-TABS) 100 MG tablet   Oral   Take 1 tablet (100 mg total) by mouth 2 (two) times daily.   20 tablet   0   . ondansetron (ZOFRAN ODT) 8 MG disintegrating tablet   Oral   Take 1 tablet (8 mg total) by mouth every 8 (eight) hours as needed for nausea.   20 tablet   0     BP 130/76  Pulse 91  Temp(Src) 98.2 F (36.8 C) (Oral)  Resp 14  SpO2 93%  Physical Exam  Nursing note and vitals reviewed. Constitutional: She appears well-developed and well-nourished. No distress.  HENT:  Head: Normocephalic and atraumatic.  Right Ear: External ear normal.  Left Ear: External ear normal.  Eyes: Conjunctivae are normal. Right eye exhibits no  discharge. Left eye exhibits no discharge. No scleral icterus.  Neck: Neck supple. No tracheal deviation present.  Cardiovascular: Normal rate, regular rhythm and intact distal pulses.   Pulmonary/Chest: Effort normal and breath sounds normal. No stridor. No respiratory distress. Wheezes: occasional end expiratory wheeze. She has no rales.  Abdominal: Soft. Bowel sounds are normal. She exhibits no distension. There is no tenderness. There is no rebound and no guarding.  Musculoskeletal: She exhibits no edema and no tenderness.  Mild pain with range of motion right shoulder, no erythema, no appreciable effusion  Neurological: She is alert. She has normal strength. No sensory deficit. Cranial nerve deficit:  no gross defecits noted. She exhibits normal muscle tone. She displays no seizure activity. Coordination normal.  Skin: Skin is warm and dry. No rash noted.  Small scab consistent with remove a tick posterior right shoulder, no surrounding erythema or rash  Psychiatric: She has a normal mood and affect.    ED Course  Procedures (including critical care time)  Labs Reviewed - No data to display No results found.  1. Tick bite       MDM  Patient did have a recent tick bite. Her symptoms may be related to a viral upper respiratory infection considering her cough and smoking history. Her, considering the tick bite in her complaints of myalgias and subjective fevers I will start her empirically on doxycycline to cover for [facility] spotted fever. I discussed the warning signs and reasons for the patient to return to the emergency department.        Celene Kras, MD 10/30/12 1540

## 2012-10-30 NOTE — ED Notes (Signed)
Pt here for c/o fatigue nausea and rt shoulder pain.Pt stated that she was biten by tick and after the symptom started

## 2012-10-30 NOTE — Discharge Instructions (Signed)
Wood Tick Bite  Ticks are insects that attach themselves to the skin. Most tick bites are harmless, but sometimes ticks carry diseases that can make a person quite ill. The chance of getting ill depends on:   The kind of tick that bites you.   Time of year.   How long the tick is attached.   Geographic location.  Wood ticks are also called dog ticks. They are generally black. They can have white markings. They live in shrubs and grassy areas. They are larger than deer ticks. Wood ticks are about the size of a watermelon seed. They have a hard body.  The most common places for ticks to attach themselves are the scalp, neck, armpits, waist, and groin. Wood ticks may stay attached for up to 2 weeks.  TICKS MUST BE REMOVED AS SOON AS POSSIBLE TO HELP PREVENT DISEASES CAUSED BY TICK BITES.   TO REMOVE A TICK:  1. If available, put on latex gloves before trying to remove a tick.  2. Grasp the tick as close to the skin as possible, with curved forceps, fine tweezers or a special tick removal tool.  3. Pull gently with steady pressure until the tick lets go. Do not twist the tick or jerk it suddenly. This may break off the tick's head or mouth parts.  4. Do not crush the tick's body. This could force disease-carrying fluids from the tick into your body.  5. After the tick is removed, wash the bite area and your hands with soap and water or other disinfectant.  6. Apply a small amount of antiseptic cream or ointment to the bite site.  7. Wash and disinfect any instruments that were used.  8. Save the tick in a jar or plastic bag for later identification. Preserve the tick with a bit of alcohol or put it in the freezer.  9. Do not apply a hot match, petroleum jelly, or fingernail polish to the tick. This does not work and may increase the chances of disease from the tick bite.  YOU MAY NEED TO SEE YOUR CAREGIVER FOR A TETANUS SHOT NOW IF:   You have no idea when you had the last one.   You have never had a tetanus shot  before.  If you need a tetanus shot, and you decide not to get one, there is a rare chance of getting tetanus. Sickness from tetanus can be serious.  If you get a tetanus shot, your arm may swell, get red and warm to the touch at the shot site. This is common and not a problem.  PREVENTION   Wear protective clothing. Long sleeves and pants are best.   Wear white clothes to see ticks more easily   Tuck your pant legs into your socks.   If walking on trail, stay in the middle of the trail to avoid brushing against bushes.   Put insect repellent on all exposed skin and along boot tops, pant legs and sleeve cuffs   Check clothing, hair and skin repeatedly and before coming inside.   Brush off any ticks that are not attached.  SEEK MEDICAL CARE IF:    You cannot remove a tick or part of the tick that is left in the skin.   Unexplained fever.   Redness and swelling in the area of the tick bite.   Tender, swollen lymph glands.   Diarrhea.   Weight loss.   Cough.   Fatigue.   Muscle, joint or bone   pain.   Belly pain.   Headache.   Rash.  SEEK IMMEDIATE MEDICAL CARE IF:    You develop an oral temperature above 102 F (38.9 C).   You are having trouble walking or moving your legs.   Numbness in the legs.   Shortness of breath.   Confusion.   Repeated vomiting.  Document Released: 05/24/2000 Document Revised: 08/19/2011 Document Reviewed: 05/02/2008  [employer] Patient Information 2014 [employer].

## 2012-11-17 ENCOUNTER — Emergency Department (HOSPITAL_COMMUNITY): Payer: Self-pay

## 2012-11-17 ENCOUNTER — Emergency Department (HOSPITAL_COMMUNITY)
Admission: EM | Admit: 2012-11-17 | Discharge: 2012-11-17 | Disposition: A | Discharge status: Home / Self Care | Payer: Self-pay | Attending: Emergency Medicine | Admitting: Emergency Medicine

## 2012-11-17 ENCOUNTER — Encounter (HOSPITAL_COMMUNITY): Payer: Self-pay | Admitting: *Deleted

## 2012-11-17 DIAGNOSIS — J441 Chronic obstructive pulmonary disease with (acute) exacerbation: Secondary | ICD-10-CM | POA: Insufficient documentation

## 2012-11-17 DIAGNOSIS — R059 Cough, unspecified: Secondary | ICD-10-CM | POA: Insufficient documentation

## 2012-11-17 DIAGNOSIS — R05 Cough: Secondary | ICD-10-CM | POA: Insufficient documentation

## 2012-11-17 DIAGNOSIS — F17200 Nicotine dependence, unspecified, uncomplicated: Secondary | ICD-10-CM | POA: Insufficient documentation

## 2012-11-17 DIAGNOSIS — J449 Chronic obstructive pulmonary disease, unspecified: Secondary | ICD-10-CM

## 2012-11-17 DIAGNOSIS — Z79899 Other long term (current) drug therapy: Secondary | ICD-10-CM | POA: Insufficient documentation

## 2012-11-17 DIAGNOSIS — R197 Diarrhea, unspecified: Secondary | ICD-10-CM | POA: Insufficient documentation

## 2012-11-17 DIAGNOSIS — M549 Dorsalgia, unspecified: Secondary | ICD-10-CM | POA: Insufficient documentation

## 2012-11-17 DIAGNOSIS — Z872 Personal history of diseases of the skin and subcutaneous tissue: Secondary | ICD-10-CM | POA: Insufficient documentation

## 2012-11-17 HISTORY — DX: Chronic obstructive pulmonary disease, unspecified: J44.9

## 2012-11-17 MED ORDER — PREDNISONE 10 MG PO TABS: 20.0000 mg | ORAL_TABLET | Freq: Every day | ORAL | Status: DC

## 2012-11-17 MED ORDER — ALBUTEROL SULFATE (5 MG/ML) 0.5% IN NEBU
5.0000 mg | INHALATION_SOLUTION | Freq: Once | RESPIRATORY_TRACT | Status: AC
  Administered 2012-11-17: 5 mg via RESPIRATORY_TRACT
  Filled 2012-11-17: qty 1

## 2012-11-17 MED ORDER — IPRATROPIUM BROMIDE 0.02 % IN SOLN
0.5000 mg | Freq: Once | RESPIRATORY_TRACT | Status: AC
  Administered 2012-11-17: 0.5 mg via RESPIRATORY_TRACT
  Filled 2012-11-17: qty 2.5

## 2012-11-17 MED ORDER — ALBUTEROL SULFATE HFA 108 (90 BASE) MCG/ACT IN AERS
2.0000 | INHALATION_SPRAY | RESPIRATORY_TRACT | Status: DC
  Administered 2012-11-17: 2 via RESPIRATORY_TRACT
  Filled 2012-11-17: qty 6.7

## 2012-11-17 MED ORDER — PREDNISONE 50 MG PO TABS
60.0000 mg | ORAL_TABLET | Freq: Once | ORAL | Status: AC
  Administered 2012-11-17: 60 mg via ORAL
  Filled 2012-11-17: qty 1

## 2012-11-17 NOTE — ED Provider Notes (Signed)
History  This chart was scribed for Toy Baker, MD by Bennett Scrape, ED Scribe. This patient was seen in room APA18/APA18 and the patient's care was started at 1:39 PM.  CSN: [id number]  Arrival date & time 11/17/12  1256   First MD Initiated Contact with Patient 11/17/12 1339      Chief Complaint  Patient presents with  . Shortness of Breath  . Back Pain     The history is provided by the patient. No language interpreter was used.    HPI Comments: Erin Hobbs is a 52 y.o. female with a h/o COPD who presents to the Emergency Department complaining of 2 weeks of gradual onset, gradually worsening, constant SOB with associated nonproductive cough and diarrhea that has now improved. She reports that she has back pain during coughing fits and states that the symptoms are worse at night. Pt states that she had to use the Proventil inhaler 4 to 5 times a day. She denies leg pain or swelling fevers and emesis as associated symptoms. Pt is a current 0.5 ppd everyday smoker but denies alcohol use.   Past Medical History  Diagnosis Date  . Ulcer   . COPD (chronic obstructive pulmonary disease)     Past Surgical History  Procedure Laterality Date  . Tubal ligation      History reviewed. No pertinent family history.  History  Substance Use Topics  . Smoking status: Current Every Day Smoker -- 0.50 packs/day  . Smokeless tobacco: Not on file  . Alcohol Use: No    No OB history provided.  Review of Systems  Constitutional: Negative for fever.  Respiratory: Positive for cough and shortness of breath.   Cardiovascular: Negative for chest pain.  Gastrointestinal: Positive for diarrhea. Negative for nausea and vomiting.  Musculoskeletal: Positive for back pain.  All other systems reviewed and are negative.    Allergies  Aspirin and Mushroom extract complex  Home Medications   Current Outpatient Rx  Name  Route  Sig  Dispense  Refill  . acetaminophen (TYLENOL)  500 MG tablet   Oral   Take 1,000 mg by mouth every 6 (six) hours as needed for pain or fever.         Marland Kitchen albuterol (PROVENTIL HFA;VENTOLIN HFA) 108 (90 BASE) MCG/ACT inhaler   Inhalation   Inhale 2 puffs into the lungs every 6 (six) hours as needed for wheezing.           Triage Vitals: BP 131/96  Pulse 76  Temp(Src) 97.8 F (36.6 C) (Oral)  Resp 22  Ht 5\' 4"  (1.626 m)  Wt 141 lb (63.957 kg)  BMI 24.19 kg/m2  SpO2 100%  Physical Exam  Nursing note and vitals reviewed. Constitutional: She is oriented to person, place, and time. She appears well-developed and well-nourished. No distress.  HENT:  Head: Normocephalic and atraumatic.  Eyes: EOM are normal.  Neck: Neck supple. No tracheal deviation present.  Cardiovascular: Normal rate and regular rhythm.   Pulmonary/Chest: Effort normal. No respiratory distress. She has wheezes (faint expiratory wheezing).  Abdominal: Soft. There is no tenderness.  Musculoskeletal: Normal range of motion. She exhibits no edema.  Neurological: She is alert and oriented to person, place, and time.  Skin: Skin is warm and dry.  Psychiatric: She has a normal mood and affect. Her behavior is normal.    ED Course  Procedures (including critical care time)  DIAGNOSTIC STUDIES: Oxygen Saturation is 100% on room air, normal by my  interpretation.    COORDINATION OF CARE: 2:38 PM-Informed pt of radiology results. Discussed discharge plan which includes smoking cessation and inhaler with pt and pt agreed to plan. Also advised pt to follow up as needed and pt agreed. Addressed symptoms to return for with pt.   Dg Chest 2 View  11/17/2012   *RADIOLOGY REPORT*  Clinical Data: Shortness of breath, cough, back pain, smoker  CHEST - 2 VIEW  Comparison: 01/29/2012 abdomen CT  Findings: Mild hyperinflation.  Lungs remain clear.  No focal pneumonia, collapse, consolidation, edema, effusion or pneumothorax.  Normal heart size and vascularity.  Trachea midline.   IMPRESSION: Hyperinflation.  No superimposed acute process.   Original Report Authenticated By: Judie Petit. Miles Costain, M.D.     No diagnosis found.    MDM  Patient given albuterol with Atrovent prior to my arrival. On exam here is essentially normal. Will give patient prednisone and prescription for albuterol and she is stable for discharge     I personally performed the services described in this documentation, which was scribed in my presence. The recorded information has been reviewed and is accurate.     Toy Baker, MD 11/17/12 1447

## 2012-11-17 NOTE — ED Notes (Addendum)
Shortness of breath began 2 weeks ago.  Was given Proventil INH which helped.  She would use it 4-5 x day.  Pain between shoulder blades on back, worse w/deep breath and w/lying down.

## 2012-11-17 NOTE — ED Notes (Signed)
Sprite, crackers, and peanut butter given with oral prednisone. Resp paged about inhaler.

## 2012-11-17 NOTE — ED Notes (Signed)
Resp paged for breathing treatment.  

## 2013-03-19 ENCOUNTER — Emergency Department (HOSPITAL_COMMUNITY): Payer: Self-pay

## 2013-03-19 ENCOUNTER — Encounter (HOSPITAL_COMMUNITY): Payer: Self-pay | Admitting: Emergency Medicine

## 2013-03-19 ENCOUNTER — Emergency Department (HOSPITAL_COMMUNITY)
Admission: EM | Admit: 2013-03-19 | Discharge: 2013-03-19 | Disposition: A | Discharge status: Home / Self Care | Payer: Self-pay | Attending: Emergency Medicine | Admitting: Emergency Medicine

## 2013-03-19 DIAGNOSIS — M47812 Spondylosis without myelopathy or radiculopathy, cervical region: Secondary | ICD-10-CM

## 2013-03-19 DIAGNOSIS — J4489 Other specified chronic obstructive pulmonary disease: Secondary | ICD-10-CM | POA: Insufficient documentation

## 2013-03-19 DIAGNOSIS — Y939 Activity, unspecified: Secondary | ICD-10-CM | POA: Insufficient documentation

## 2013-03-19 DIAGNOSIS — F17200 Nicotine dependence, unspecified, uncomplicated: Secondary | ICD-10-CM | POA: Insufficient documentation

## 2013-03-19 DIAGNOSIS — J449 Chronic obstructive pulmonary disease, unspecified: Secondary | ICD-10-CM | POA: Insufficient documentation

## 2013-03-19 DIAGNOSIS — S46911A Strain of unspecified muscle, fascia and tendon at shoulder and upper arm level, right arm, initial encounter: Secondary | ICD-10-CM

## 2013-03-19 DIAGNOSIS — M479 Spondylosis, unspecified: Secondary | ICD-10-CM | POA: Insufficient documentation

## 2013-03-19 DIAGNOSIS — IMO0002 Reserved for concepts with insufficient information to code with codable children: Secondary | ICD-10-CM | POA: Insufficient documentation

## 2013-03-19 DIAGNOSIS — X58XXXA Exposure to other specified factors, initial encounter: Secondary | ICD-10-CM | POA: Insufficient documentation

## 2013-03-19 DIAGNOSIS — Y929 Unspecified place or not applicable: Secondary | ICD-10-CM | POA: Insufficient documentation

## 2013-03-19 DIAGNOSIS — Z872 Personal history of diseases of the skin and subcutaneous tissue: Secondary | ICD-10-CM | POA: Insufficient documentation

## 2013-03-19 MED ORDER — HYDROCODONE-ACETAMINOPHEN 5-325 MG PO TABS
ORAL_TABLET | ORAL | Status: AC
  Administered 2013-03-19: 12:00:00
  Filled 2013-03-19: qty 1

## 2013-03-19 MED ORDER — CYCLOBENZAPRINE HCL 5 MG PO TABS: 5.0000 mg | ORAL_TABLET | Freq: Three times a day (TID) | ORAL | Status: DC | PRN

## 2013-03-19 MED ORDER — HYDROCODONE-ACETAMINOPHEN 5-325 MG PO TABS: 1.0000 | ORAL_TABLET | ORAL | Status: DC | PRN

## 2013-03-19 NOTE — ED Provider Notes (Signed)
CSN: [id number]     Arrival date & time 03/19/13  1034 History  This chart was scribed for Burgess Amor, non-physician practitioner working, with Celene Kras, MD by Bennett Scrape, ED Scribe. This patient was seen in room APFT21/APFT21 and the patient's care was started at 11:55 AM.    Chief Complaint  Patient presents with  . Shoulder Pain    The history is provided by the patient. No language interpreter was used.    HPI Comments: Erin Hobbs is a 52 y.o. female who presents to the Emergency Department complaining of 3 months of persistent, non-changing right shoulder pain that radiates down the entire right arm that started upon waking after sleeping on her right side months ago, denies specific injury. She describes the pain in the join as an ache rated an 8 out of 10. She reports that the pain is increased with lifting of the right arm above shoulder height. She also states that she normally sleeps on her left and has continued to do so since the pain started. She denies any falls or injuries. 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 She also reports occasional burning pain in her posterior neck and states that she will feel the occasional "pop" with turning her head. She denies any shooting pain or numbness in the right arm with the pops. She denies taking any OTC medications stating that she has an ulcer and cannot take most medications. She denies being on any medications for the ulcer stating that she "eats right" to help control the symptoms.  She also reports a secondary complaint of sudden onset right medial thigh pain that worsened last night while at a baseball game. She states that she was standing at the time when she suddenly felt a tingling sensation and the "soreness" worsened which is superficial, skin pain,  Worsened with light touch. She denies any  recent trips or falls. She denies any recent insect bites, rash or fever.  Pt is currently unemployed  No PCP currently. States that she has an "orange card" coming in the next 2 weeks that will help her get a PCP   Past Medical History  Diagnosis Date  . Ulcer   . COPD (chronic obstructive pulmonary disease)    Past Surgical History  Procedure Laterality Date  . Tubal ligation     No family history on file. History  Substance Use Topics  . Smoking status: Current Every Day Smoker -- 0.50 packs/day    Types: Cigarettes  . Smokeless tobacco: Not on file  . Alcohol Use: No   No OB history provided.  Review of Systems  Constitutional: Negative for fever and chills.  Musculoskeletal: Positive for arthralgias and joint swelling. Negative for myalgias.  Skin: Negative for rash and wound.  Neurological: Negative for weakness and numbness.    Allergies  Aspirin and Mushroom extract complex-due to ulcer  Home Medications   Current Outpatient Rx  Name  Route  Sig  Dispense  Refill  . acetaminophen (TYLENOL) 500 MG tablet   Oral   Take 1,000 mg by mouth every 6 (six) hours as needed for pain or fever.         . cyclobenzaprine (FLEXERIL) 5 MG tablet   Oral   Take 1 tablet (5 mg total) by mouth 3 (three) times daily as needed for muscle spasms.  15 tablet   0   . HYDROcodone-acetaminophen (NORCO/VICODIN) 5-325 MG per tablet   Oral   Take 1 tablet by mouth every 4 (four) hours as needed for pain.   15 tablet   0    Triage Vitals: BP 132/80  Pulse 100  Temp(Src) 98 F (36.7 C) (Oral)  Resp 18  Ht 5\' 4"  (1.626 m)  Wt 140 lb (63.504 kg)  BMI 24.02 kg/m2  SpO2 100%  Physical Exam  Nursing note and vitals reviewed. Constitutional: She appears well-developed and well-nourished.  HENT:  Head: Atraumatic.  Neck: Normal range of motion.  Cardiovascular:  Pulses equal bilaterally  Musculoskeletal: She exhibits tenderness.  Tenderness to palpation right anterior  and posterior shoulder, pain with ROM, no edema, no erythema.  Neurological: She is alert. She has normal strength. She displays normal reflexes. No sensory deficit.  Equal grip strength  Skin: Skin is warm and dry.  No overlaying skin changes of the right thigh , no rash,  Tender to light palpation.  Psychiatric: She has a normal mood and affect.    ED Course  Procedures (including critical care time)  DIAGNOSTIC STUDIES: Oxygen Saturation is 100% on room air, normal by my interpretation.    COORDINATION OF CARE: 12:04 PM-Discussed treatment plan which includes x-ray of the C-spine and right shoulder with pt at bedside and pt agreed to plan. She states that she has a ride home. Upon trying to leave the room, pt began complaining of right-sided CP underneath her breast that started suddenly and runs straight up into her neck. She states that it is improving currently.   Labs Review Labs Reviewed - No data to display Imaging Review Dg Cervical Spine Complete  03/19/2013   CLINICAL DATA:  Right shoulder pain for 3 months. No injury.  EXAM: CERVICAL SPINE  4+ VIEWS  COMPARISON:  None.  FINDINGS: Suboptimal visualization of the cervicothoracic junction, despite swimmer's view. No fracture or dislocation is identified. Carotid atherosclerosis. Mild uncovertebral spurring is present in the lower cervical spine without bony foraminal stenosis. C5-C6 and C6-C7 degenerative disc disease with narrowing of the disc spaces. There is no spondylolisthesis. The prevertebral soft tissues are normal. The odontoid is intact.  IMPRESSION: Mild mid to lower cervical spondylosis.   Electronically Signed   By: Andreas Newport M.D.   On: 03/19/2013 12:41   Dg Shoulder Right  03/19/2013   CLINICAL DATA:  Shoulder pain.  EXAM: RIGHT SHOULDER - 2+ VIEW  COMPARISON:  None.  FINDINGS: There is no evidence of fracture or dislocation. There is no evidence of arthropathy or other focal bone abnormality. Soft tissues are  unremarkable.  IMPRESSION: Negative.   Electronically Signed   By: Andreas Newport M.D.   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 Shoulder strain, right, initial encounter    Patients labs and/or radiological studies were viewed and considered during the medical decision making and disposition process. Xray findings discussed with patient.  She was prescribed flexeril, hydrocodone, encouraged heat therapy.  Referrals given for ortho f/u.  I personally performed the services described in this documentation, which was scribed in my presence. The recorded information has been reviewed and is accurate.   Burgess Amor, PA-C 03/19/13 1545

## 2013-03-19 NOTE — ED Notes (Signed)
Pain rt shoulder for 3 mos,no known injury.  Last night when standing had sudden onset of pain in rt thigh.

## 2013-03-19 NOTE — ED Notes (Signed)
Pt reports right shoulder pain for 3 months, denies any known injury, the pain "runs down my arm", and now having right leg pain that feels the same, thinks she may have pinched nerve.  Waiting on "orange card" to get a doctor.

## 2013-03-21 NOTE — ED Provider Notes (Signed)
Medical screening examination/treatment/procedure(s) were performed by non-physician practitioner and as supervising physician I was immediately available for consultation/collaboration.   Riyan Haile R Drystan Reader, MD 03/21/13 1011 

## 2013-07-04 ENCOUNTER — Emergency Department (HOSPITAL_COMMUNITY)
Admission: EM | Admit: 2013-07-04 | Discharge: 2013-07-04 | Disposition: A | Discharge status: Home / Self Care | Payer: Self-pay | Attending: Emergency Medicine | Admitting: Emergency Medicine

## 2013-07-04 ENCOUNTER — Emergency Department (HOSPITAL_COMMUNITY): Payer: Self-pay

## 2013-07-04 ENCOUNTER — Encounter (HOSPITAL_COMMUNITY): Payer: Self-pay | Admitting: Emergency Medicine

## 2013-07-04 DIAGNOSIS — Z9851 Tubal ligation status: Secondary | ICD-10-CM | POA: Insufficient documentation

## 2013-07-04 DIAGNOSIS — Z8719 Personal history of other diseases of the digestive system: Secondary | ICD-10-CM | POA: Insufficient documentation

## 2013-07-04 DIAGNOSIS — R197 Diarrhea, unspecified: Secondary | ICD-10-CM | POA: Insufficient documentation

## 2013-07-04 DIAGNOSIS — J9801 Acute bronchospasm: Secondary | ICD-10-CM

## 2013-07-04 DIAGNOSIS — F17200 Nicotine dependence, unspecified, uncomplicated: Secondary | ICD-10-CM | POA: Insufficient documentation

## 2013-07-04 DIAGNOSIS — J441 Chronic obstructive pulmonary disease with (acute) exacerbation: Secondary | ICD-10-CM | POA: Insufficient documentation

## 2013-07-04 DIAGNOSIS — J3489 Other specified disorders of nose and nasal sinuses: Secondary | ICD-10-CM | POA: Insufficient documentation

## 2013-07-04 DIAGNOSIS — R51 Headache: Secondary | ICD-10-CM | POA: Insufficient documentation

## 2013-07-04 DIAGNOSIS — IMO0001 Reserved for inherently not codable concepts without codable children: Secondary | ICD-10-CM | POA: Insufficient documentation

## 2013-07-04 DIAGNOSIS — M549 Dorsalgia, unspecified: Secondary | ICD-10-CM | POA: Insufficient documentation

## 2013-07-04 DIAGNOSIS — J40 Bronchitis, not specified as acute or chronic: Secondary | ICD-10-CM

## 2013-07-04 DIAGNOSIS — R6883 Chills (without fever): Secondary | ICD-10-CM | POA: Insufficient documentation

## 2013-07-04 LAB — COMPREHENSIVE METABOLIC PANEL
ALT: 11 U/L (ref 0–35)
AST: 14 U/L (ref 0–37)
Albumin: 4.2 g/dL (ref 3.5–5.2)
Alkaline Phosphatase: 88 U/L (ref 39–117)
BILIRUBIN TOTAL: 0.4 mg/dL (ref 0.3–1.2)
BUN: 8 mg/dL (ref 6–23)
CALCIUM: 9.2 mg/dL (ref 8.4–10.5)
CHLORIDE: 97 meq/L (ref 96–112)
CO2: 26 meq/L (ref 19–32)
CREATININE: 0.79 mg/dL (ref 0.50–1.10)
GLUCOSE: 80 mg/dL (ref 70–99)
Potassium: 3.7 mEq/L (ref 3.7–5.3)
Sodium: 137 mEq/L (ref 137–147)
Total Protein: 8.2 g/dL (ref 6.0–8.3)

## 2013-07-04 LAB — CBC WITH DIFFERENTIAL/PLATELET
Basophils Absolute: 0 10*3/uL (ref 0.0–0.1)
Basophils Relative: 0 % (ref 0–1)
EOS PCT: 0 % (ref 0–5)
Eosinophils Absolute: 0 10*3/uL (ref 0.0–0.7)
HEMATOCRIT: 43.2 % (ref 36.0–46.0)
HEMOGLOBIN: 15 g/dL (ref 12.0–15.0)
LYMPHS ABS: 3 10*3/uL (ref 0.7–4.0)
LYMPHS PCT: 17 % (ref 12–46)
MCH: 29.8 pg (ref 26.0–34.0)
MCHC: 34.7 g/dL (ref 30.0–36.0)
MCV: 85.7 fL (ref 78.0–100.0)
MONO ABS: 1.7 10*3/uL — AB (ref 0.1–1.0)
MONOS PCT: 10 % (ref 3–12)
NEUTROS ABS: 12.7 10*3/uL — AB (ref 1.7–7.7)
Neutrophils Relative %: 73 % (ref 43–77)
Platelets: 238 10*3/uL (ref 150–400)
RBC: 5.04 MIL/uL (ref 3.87–5.11)
RDW: 13.2 % (ref 11.5–15.5)
WBC: 17.6 10*3/uL — AB (ref 4.0–10.5)

## 2013-07-04 MED ORDER — IPRATROPIUM BROMIDE 0.02 % IN SOLN
0.5000 mg | Freq: Once | RESPIRATORY_TRACT | Status: AC
  Administered 2013-07-04: 0.5 mg via RESPIRATORY_TRACT
  Filled 2013-07-04 (×2): qty 2.5

## 2013-07-04 MED ORDER — METHYLPREDNISOLONE SODIUM SUCC 125 MG IJ SOLR
125.0000 mg | Freq: Once | INTRAMUSCULAR | Status: AC
  Administered 2013-07-04: 125 mg via INTRAVENOUS
  Filled 2013-07-04: qty 2

## 2013-07-04 MED ORDER — PREDNISONE 10 MG PO TABS: 20.0000 mg | ORAL_TABLET | Freq: Every day | ORAL | Status: DC

## 2013-07-04 MED ORDER — AMOXICILLIN 500 MG PO CAPS: 500.0000 mg | ORAL_CAPSULE | Freq: Three times a day (TID) | ORAL | Status: DC

## 2013-07-04 MED ORDER — ALBUTEROL SULFATE HFA 108 (90 BASE) MCG/ACT IN AERS
2.0000 | INHALATION_SPRAY | RESPIRATORY_TRACT | Status: DC | PRN
  Administered 2013-07-04: 2 via RESPIRATORY_TRACT
  Filled 2013-07-04: qty 6.7

## 2013-07-04 MED ORDER — ALBUTEROL SULFATE (2.5 MG/3ML) 0.083% IN NEBU
5.0000 mg | INHALATION_SOLUTION | Freq: Once | RESPIRATORY_TRACT | Status: AC
  Administered 2013-07-04: 5 mg via RESPIRATORY_TRACT
  Filled 2013-07-04 (×2): qty 6

## 2013-07-04 NOTE — ED Provider Notes (Signed)
CSN: [id number]     Arrival date & time 07/04/13  1533 History   First MD Initiated Contact with Patient 07/04/13 1647     This chart was scribed for Erin LennertJoseph L Bravlio Luca, MD by Arlan OrganAshley Leger, ED Scribe. This patient was seen in room APA07/APA07 and the patient's care was started 4:51 PM.  Chief Complaint  Patient presents with  . Cough   Patient is a 53 y.o. female presenting with cough. The history is provided by the patient. No language interpreter was used.  Cough Cough characteristics:  Productive Sputum characteristics:  Clear and yellow Severity:  Moderate Onset quality:  Gradual Duration:  1 week Timing:  Constant Progression:  Worsening Chronicity:  New Context: sick contacts   Relieved by:  None tried Worsened by:  Nothing tried Ineffective treatments:  None tried Associated symptoms: chills, headaches, myalgias and rhinorrhea   Associated symptoms: no chest pain, no eye discharge and no rash   Headaches:    Severity:  Mild   Onset quality:  Gradual   Chronicity:  New Myalgias:    Location:  Back   Quality:  Aching   Severity:  Mild   Onset quality:  Gradual   Duration:  1 week   HPI Comments: Vick FreesCarina J Lowman is a 53 y.o. Female with a PMHx of COPD who presents to the Emergency Department complaining of a gradual onset, gradually worsening, constant, moderate productive cough consisting of yellow and clear sputum that intially started 1 week ago. She states her symptoms first started with rhinorrhea, and says moved into her chest since time of onset. She also reports diarrhea, chills, HA, and back pain described as "aches". She states her grandchild is currently sick, but denies any other recent sick contacts at this time. Denies any alleviating or aggravating factors. Denies fever. She states she recently stopped smoking about 2 weeks ago before her symptoms started, but states she typically smoked about a half a pack of cigarettes a day. No other complaints at this  time.  Past Medical History  Diagnosis Date  . Ulcer   . COPD (chronic obstructive pulmonary disease)    Past Surgical History  Procedure Laterality Date  . Tubal ligation     No family history on file. History  Substance Use Topics  . Smoking status: Current Every Day Smoker -- 0.50 packs/day    Types: Cigarettes  . Smokeless tobacco: Not on file  . Alcohol Use: No   OB History   Grav Para Term Preterm Abortions TAB SAB Ect Mult Living                 Review of Systems  Constitutional: Positive for chills. Negative for appetite change and fatigue.  HENT: Positive for rhinorrhea. Negative for congestion, ear discharge and sinus pressure.   Eyes: Negative for discharge.  Respiratory: Positive for cough.   Cardiovascular: Negative for chest pain.  Gastrointestinal: Negative for abdominal pain and diarrhea.  Genitourinary: Negative for frequency and hematuria.  Musculoskeletal: Positive for myalgias. Negative for back pain.  Skin: Negative for rash.  Neurological: Positive for headaches. Negative for seizures.  Psychiatric/Behavioral: Negative for hallucinations.    Allergies  Aspirin and Mushroom extract complex  Home Medications   Current Outpatient Rx  Name  Route  Sig  Dispense  Refill  . acetaminophen (TYLENOL) 500 MG tablet   Oral   Take 1,000 mg by mouth every 6 (six) hours as needed for pain or fever.  Triage Vitals: BP 143/76  Pulse 109  Temp(Src) 98.2 F (36.8 C) (Oral)  Resp 20  Ht 5\' 4"  (1.626 m)  Wt 143 lb (64.864 kg)  BMI 24.53 kg/m2  SpO2 93%  Physical Exam  Nursing note and vitals reviewed. Constitutional: She is oriented to person, place, and time. She appears well-developed and well-nourished.  HENT:  Head: Normocephalic.  Eyes: EOM are normal.  Neck: Normal range of motion.  Cardiovascular: Normal rate and regular rhythm.   Pulmonary/Chest: Effort normal. She has wheezes.  Moderate wheezing bilaterally  Abdominal: She  exhibits no distension.  Musculoskeletal: Normal range of motion.  Neurological: She is alert and oriented to person, place, and time.  Psychiatric: She has a normal mood and affect.    ED Course  Procedures (including critical care time)  DIAGNOSTIC STUDIES: Oxygen Saturation is 93% on RA, adequate by my interpretation.    COORDINATION OF CARE: 4:49 PM- Will give breathing treatment. Discussed treatment plan with pt at bedside and pt agreed to plan.     6:45 PM- She states her symptoms have improved after the breathing treatment. Mild wheezing noted. Discussed normal X-Ray results with pt. Will prescribe prednisone and inhaler for at home use.  Labs Review Labs Reviewed - No data to display Imaging Review Dg Chest 2 View  07/04/2013   CLINICAL DATA:  Cough and congestion  EXAM: CHEST  2 VIEW  COMPARISON:  DG CHEST 2 VIEW dated 11/17/2012  FINDINGS: The heart size and mediastinal contours are within normal limits. Both lungs are clear. The visualized skeletal structures are unremarkable.  IMPRESSION: No active cardiopulmonary disease.   Electronically Signed   By: Christiana Pellant M.D.   On: 07/04/2013 16:40    EKG Interpretation   None       MDM  Copd  The chart was scribed for me under my direct supervision.  I personally performed the history, physical, and medical decision making and all procedures in the evaluation of this patient.Erin Lennert, MD 07/04/13 [phone]

## 2013-07-04 NOTE — Discharge Instructions (Signed)
Follow up this week for recheck °

## 2013-07-04 NOTE — ED Notes (Signed)
Pt c/o cough that is productive with yellow/clear sputum production, diarrhea, chills, headache, back ache, chest congestion that started a week ago,

## 2013-08-20 IMAGING — CR DG CHEST 2V
2 series · 2 of 2 positions shown · non-contrast
Comparison: 01/29/2012 abdomen CT

CLINICAL DATA: Shortness of breath, cough, back pain, smoker

CHEST - 2 VIEW

[view not recorded (1 of 2)]
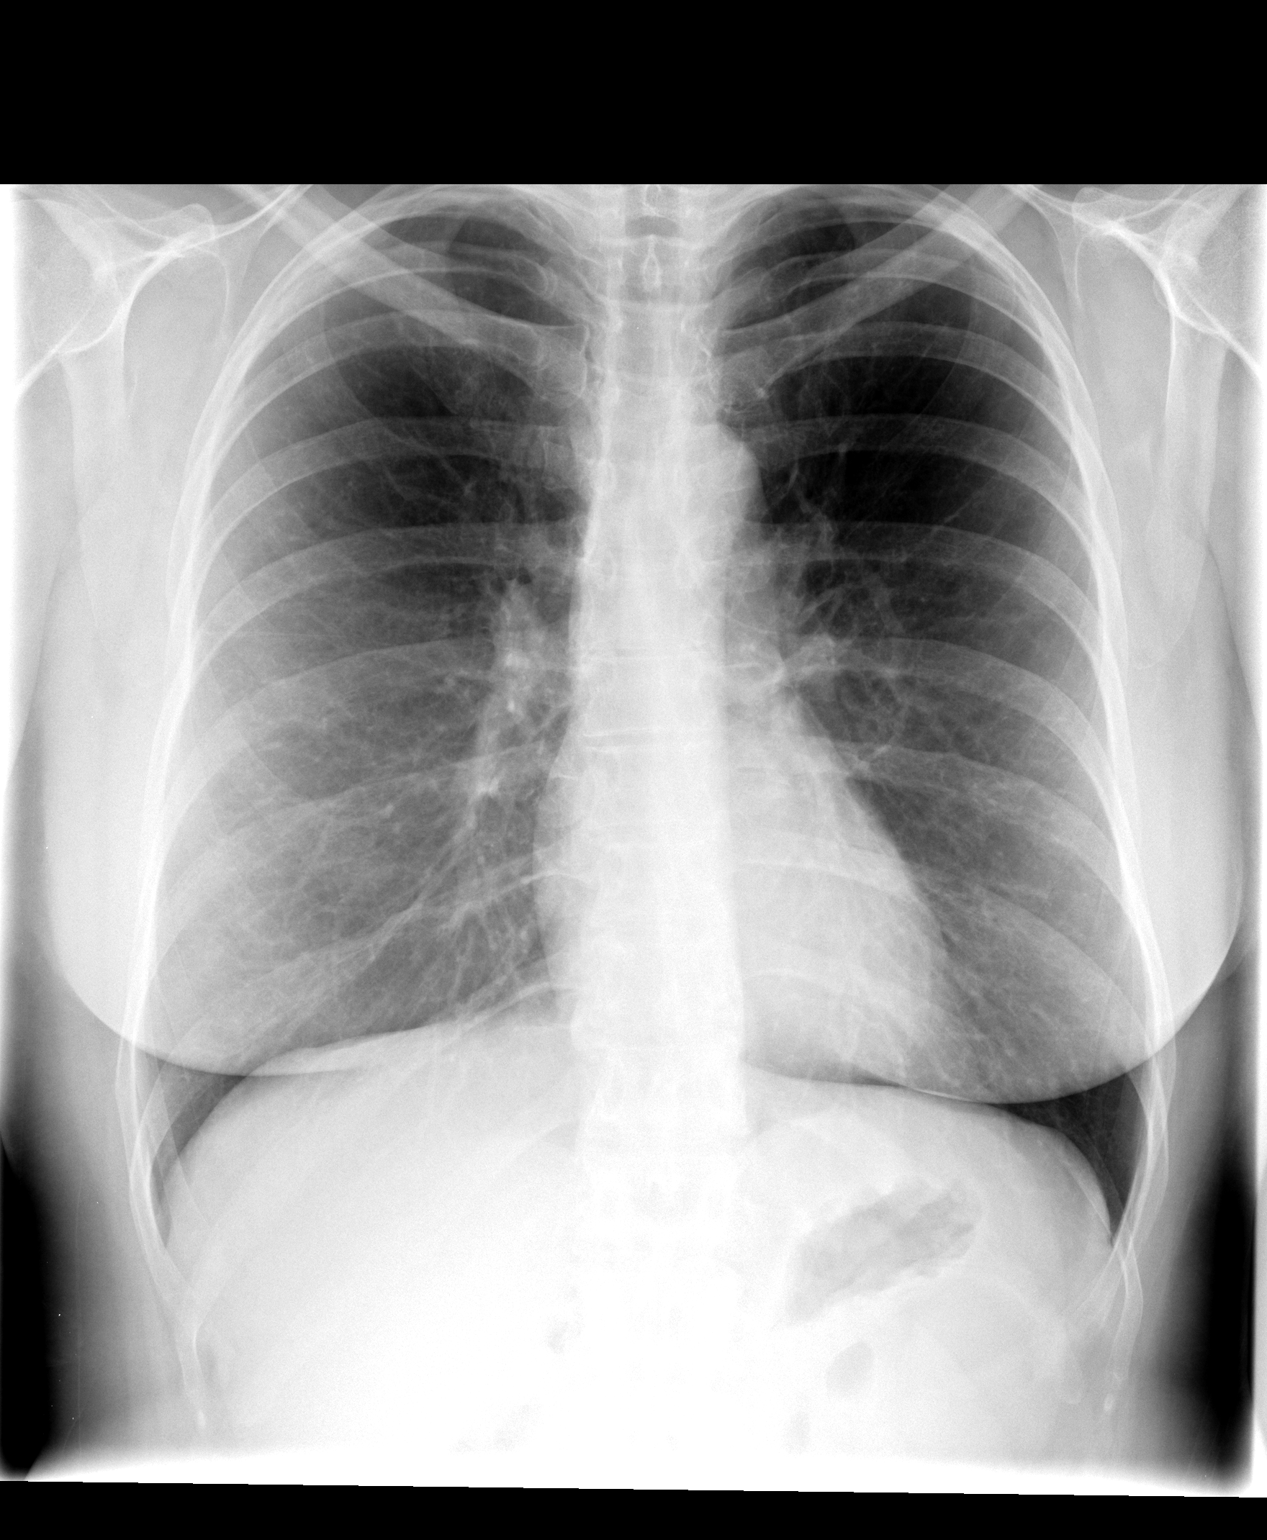

[view not recorded (2 of 2)]
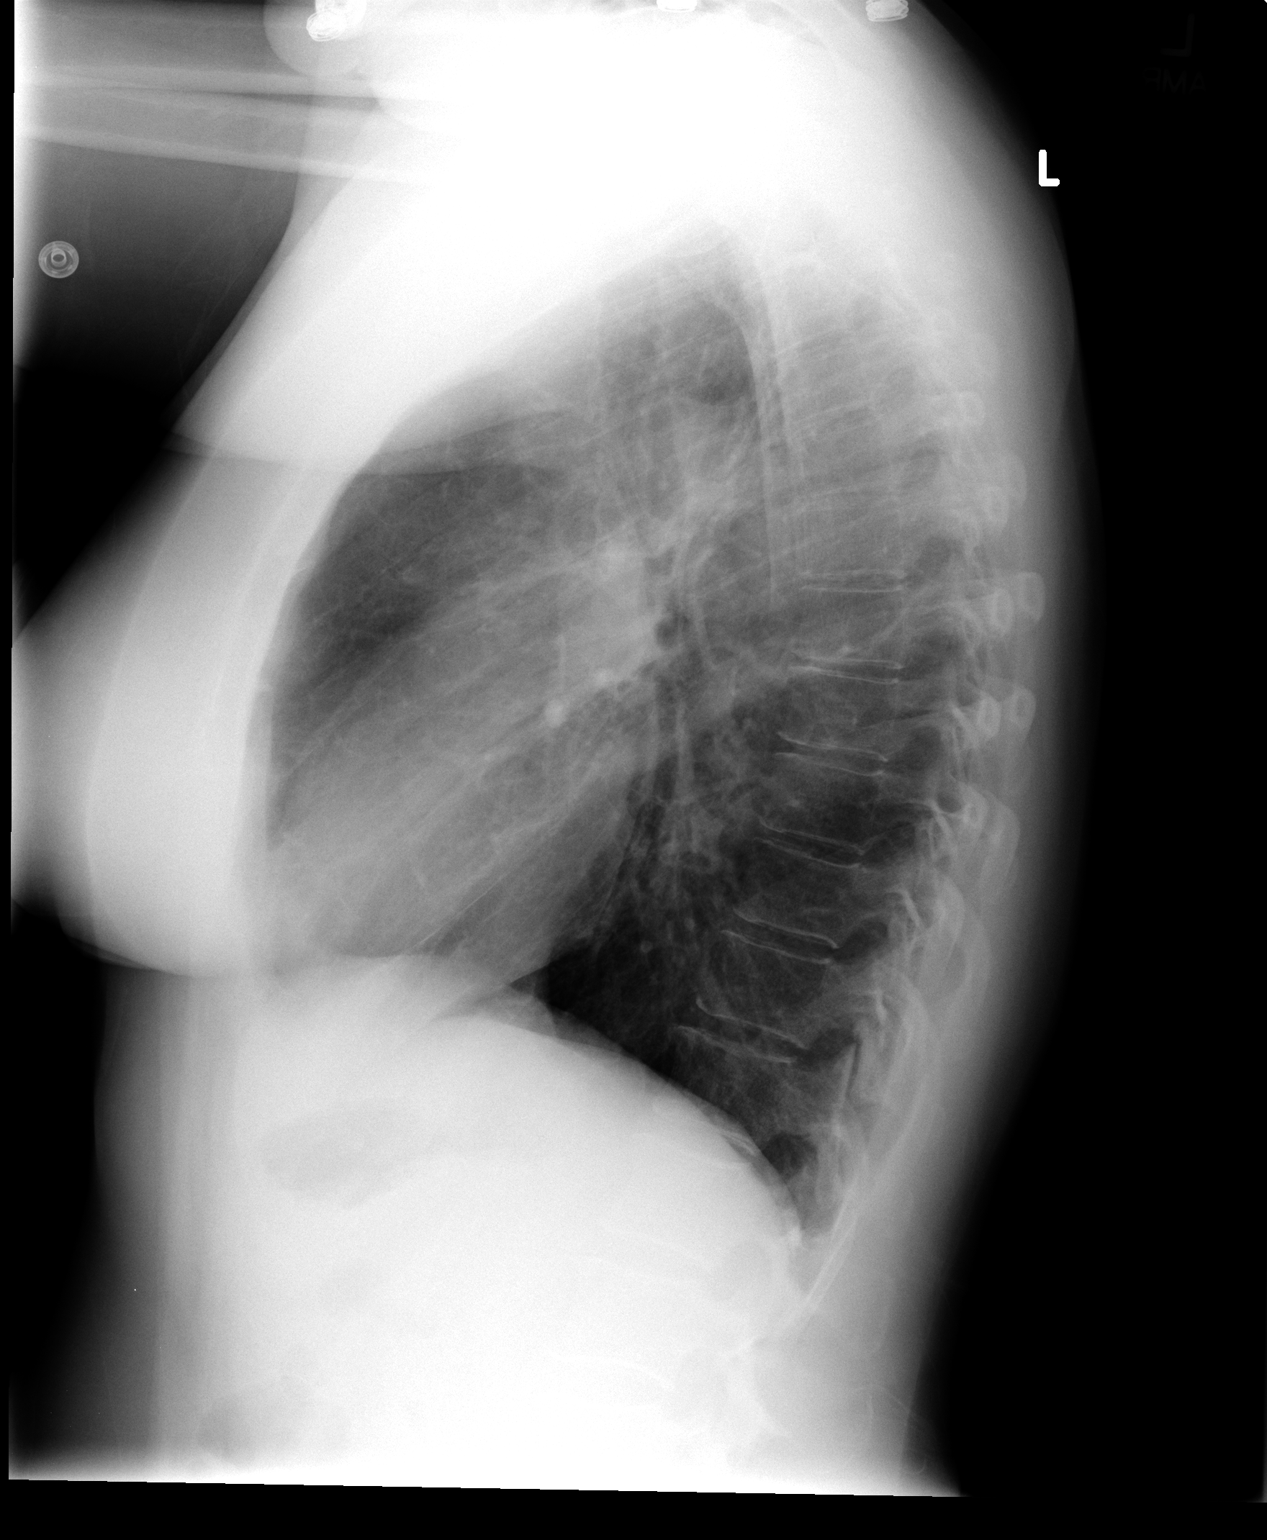

[2 of 2 positions shown; findings below may reference images not displayed]

FINDINGS: Mild hyperinflation.  Lungs remain clear.  No focal
pneumonia, collapse, consolidation, edema, effusion or
pneumothorax.  Normal heart size and vascularity.  Trachea midline.
IMPRESSION: Hyperinflation.  No superimposed acute process.

## 2014-08-19 ENCOUNTER — Encounter (HOSPITAL_COMMUNITY): Payer: Self-pay | Admitting: *Deleted

## 2014-08-19 ENCOUNTER — Emergency Department (HOSPITAL_COMMUNITY)
Admission: EM | Admit: 2014-08-19 | Discharge: 2014-08-19 | Disposition: A | Discharge status: Home / Self Care | Payer: Self-pay | Attending: Emergency Medicine | Admitting: Emergency Medicine

## 2014-08-19 DIAGNOSIS — M1990 Unspecified osteoarthritis, unspecified site: Secondary | ICD-10-CM | POA: Insufficient documentation

## 2014-08-19 DIAGNOSIS — R110 Nausea: Secondary | ICD-10-CM | POA: Insufficient documentation

## 2014-08-19 DIAGNOSIS — R1013 Epigastric pain: Secondary | ICD-10-CM | POA: Insufficient documentation

## 2014-08-19 DIAGNOSIS — R197 Diarrhea, unspecified: Secondary | ICD-10-CM | POA: Insufficient documentation

## 2014-08-19 DIAGNOSIS — J449 Chronic obstructive pulmonary disease, unspecified: Secondary | ICD-10-CM | POA: Insufficient documentation

## 2014-08-19 DIAGNOSIS — Z872 Personal history of diseases of the skin and subcutaneous tissue: Secondary | ICD-10-CM | POA: Insufficient documentation

## 2014-08-19 DIAGNOSIS — R109 Unspecified abdominal pain: Secondary | ICD-10-CM

## 2014-08-19 DIAGNOSIS — Z791 Long term (current) use of non-steroidal anti-inflammatories (NSAID): Secondary | ICD-10-CM | POA: Insufficient documentation

## 2014-08-19 DIAGNOSIS — M549 Dorsalgia, unspecified: Secondary | ICD-10-CM | POA: Insufficient documentation

## 2014-08-19 DIAGNOSIS — R1032 Left lower quadrant pain: Secondary | ICD-10-CM | POA: Insufficient documentation

## 2014-08-19 DIAGNOSIS — Z792 Long term (current) use of antibiotics: Secondary | ICD-10-CM | POA: Insufficient documentation

## 2014-08-19 DIAGNOSIS — Z79899 Other long term (current) drug therapy: Secondary | ICD-10-CM | POA: Insufficient documentation

## 2014-08-19 HISTORY — DX: Unspecified osteoarthritis, unspecified site: M19.90

## 2014-08-19 LAB — COMPREHENSIVE METABOLIC PANEL
ALK PHOS: 69 U/L (ref 39–117)
ALT: 26 U/L (ref 0–35)
AST: 29 U/L (ref 0–37)
Albumin: 4.5 g/dL (ref 3.5–5.2)
Anion gap: 8 (ref 5–15)
BILIRUBIN TOTAL: 0.4 mg/dL (ref 0.3–1.2)
BUN: 12 mg/dL (ref 6–23)
CALCIUM: 9.2 mg/dL (ref 8.4–10.5)
CO2: 28 mmol/L (ref 19–32)
Chloride: 106 mmol/L (ref 96–112)
Creatinine, Ser: 0.79 mg/dL (ref 0.50–1.10)
GFR calc non Af Amer: >90 mL/min (ref >90)
GLUCOSE: 98 mg/dL (ref 70–99)
POTASSIUM: 4.5 mmol/L (ref 3.5–5.1)
SODIUM: 142 mmol/L (ref 135–145)
TOTAL PROTEIN: 7.6 g/dL (ref 6.0–8.3)

## 2014-08-19 LAB — CBC
HCT: 48.9 % — ABNORMAL HIGH (ref 36.0–46.0)
Hemoglobin: 16.9 g/dL — ABNORMAL HIGH (ref 12.0–15.0)
MCH: 30.8 pg (ref 26.0–34.0)
MCHC: 34.6 g/dL (ref 30.0–36.0)
MCV: 89.1 fL (ref 78.0–100.0)
PLATELETS: 216 10*3/uL (ref 150–400)
RBC: 5.49 MIL/uL — AB (ref 3.87–5.11)
RDW: 13.7 % (ref 11.5–15.5)
WBC: 13.1 10*3/uL — AB (ref 4.0–10.5)

## 2014-08-19 LAB — URINALYSIS, ROUTINE W REFLEX MICROSCOPIC
Bilirubin Urine: NEGATIVE
Glucose, UA: NEGATIVE mg/dL
Ketones, ur: NEGATIVE mg/dL
NITRITE: NEGATIVE
PROTEIN: NEGATIVE mg/dL
UROBILINOGEN UA: 0.2 mg/dL (ref 0.0–1.0)
pH: 5 (ref 5.0–8.0)

## 2014-08-19 LAB — URINE MICROSCOPIC-ADD ON

## 2014-08-19 LAB — LIPASE, BLOOD: Lipase: 19 U/L (ref 11–59)

## 2014-08-19 MED ORDER — HYDROCODONE-ACETAMINOPHEN 5-325 MG PO TABS: 1.0000 | ORAL_TABLET | ORAL | Status: DC | PRN

## 2014-08-19 MED ORDER — ONDANSETRON HCL 4 MG/2ML IJ SOLN
4.0000 mg | Freq: Once | INTRAMUSCULAR | Status: AC
  Administered 2014-08-19: 4 mg via INTRAVENOUS
  Filled 2014-08-19: qty 2

## 2014-08-19 MED ORDER — MORPHINE SULFATE 4 MG/ML IJ SOLN
6.0000 mg | Freq: Once | INTRAMUSCULAR | Status: AC
  Administered 2014-08-19: 6 mg via INTRAVENOUS
  Filled 2014-08-19: qty 2

## 2014-08-19 MED ORDER — ONDANSETRON 4 MG PO TBDP: ORAL_TABLET | ORAL | Status: DC

## 2014-08-19 NOTE — ED Notes (Signed)
Pt states she took aleve for a migraine 3 days ago. Pt states she took one aleve for it and since then she has had nausea, severe diarrhea, and abdominal pain since then.

## 2014-08-19 NOTE — ED Provider Notes (Signed)
CSN: [id number]     Arrival date & time 08/19/14  1159 History  This chart was scribed for Blane Ohara, MD by Abel Presto, ED Scribe. This patient was seen in room APA11/APA11 and the patient's care was started at 2:49 PM.    Chief Complaint  Patient presents with  . Abdominal Pain      Patient is a 54 y.o. female presenting with abdominal pain. The history is provided by the patient. No language interpreter was used.  Abdominal Pain Associated symptoms: diarrhea   Associated symptoms: no chest pain, no shortness of breath and no vomiting    HPI Comments: LUVINA POIRIER is a 54 y.o. female who presents to the Emergency Department complaining of sharp intermittent upper abdominal pain with onset 3 days ago. Pt sates she took an Aleve for a headache and notes h/o ulcer Pt notes associated diarrhea and back pain. Pt notes drinking and eating aggravates the pain. Pt denies vomiting, chest pain, SOB, and weakness or numbness in bilateral LE.   Past Medical History  Diagnosis Date  . Ulcer   . COPD (chronic obstructive pulmonary disease)   . Arthritis    Past Surgical History  Procedure Laterality Date  . Tubal ligation     No family history on file. History  Substance Use Topics  . Smoking status: Current Every Day Smoker -- 0.50 packs/day    Types: Cigarettes  . Smokeless tobacco: Not on file  . Alcohol Use: No   OB History    No data available     Review of Systems  Respiratory: Negative for shortness of breath.   Cardiovascular: Negative for chest pain.  Gastrointestinal: Positive for abdominal pain and diarrhea. Negative for vomiting.  Musculoskeletal: Positive for back pain.  Neurological: Negative for weakness and numbness.  All other systems reviewed and are negative.     Allergies  Aspirin and Mushroom extract complex  Home Medications   Prior to Admission medications   Medication Sig Start Date End Date Taking? Authorizing Provider  acetaminophen  (TYLENOL) 500 MG tablet Take 1,000 mg by mouth every 6 (six) hours as needed for pain or fever.   Yes Historical Provider, MD  naproxen sodium (ANAPROX) 220 MG tablet Take 220 mg by mouth daily as needed (headache).   Yes Historical Provider, MD  amoxicillin (AMOXIL) 500 MG capsule Take 1 capsule (500 mg total) by mouth 3 (three) times daily. Patient not taking: Reported on 08/19/2014 07/04/13   Bethann Berkshire, MD  HYDROcodone-acetaminophen [facility]) 5-325 MG per tablet Take 1-2 tablets by mouth every 4 (four) hours as needed. 08/19/14   Blane Ohara, MD  ondansetron (ZOFRAN ODT) 4 MG disintegrating tablet  ODT q4 hours prn nausea/vomit 08/19/14   Blane Ohara, MD  predniSONE (DELTASONE) 10 MG tablet Take 2 tablets (20 mg total) by mouth daily. Patient not taking: Reported on 08/19/2014 07/04/13   Bethann Berkshire, MD   BP 119/85 mmHg  Pulse 104  Temp(Src) 98.8 F (37.1 C) (Oral)  Resp 18  Ht  (1.626 m)  Wt 145 lb (65.772 kg)  BMI 24.88 kg/m2  SpO2 97% Physical Exam  Constitutional: She is oriented to person, place, and time. She appears well-developed and well-nourished.  HENT:  Head: Normocephalic.  Mouth/Throat: Mucous membranes are dry (mild).  Eyes: Conjunctivae are normal. No scleral icterus.  Neck: Normal range of motion. Neck supple.  Cardiovascular: Normal rate, regular rhythm and normal heart sounds.   Pulmonary/Chest: Breath sounds normal. No respiratory  distress. She has no wheezes. She has no rales.  Abdominal: Soft. There is tenderness (mild discomfort) in the epigastric area and left lower quadrant. There is no guarding.  No peritonitis  Musculoskeletal: Normal range of motion.  Neurological: She is alert and oriented to person, place, and time.  Skin: Skin is warm and dry.  Psychiatric: She has a normal mood and affect. Her behavior is normal.  Nursing note and vitals reviewed.   ED Course  Procedures (including critical care time) DIAGNOSTIC STUDIES: Oxygen  Saturation is 98% on room air, normal by my interpretation.    COORDINATION OF CARE: 2:53 PM Discussed treatment plan with patient at beside, the patient agrees with the plan and has no further questions at this time.   Labs Review Labs Reviewed  CBC - Abnormal; Notable for the following:    WBC 13.1 (*)    RBC 5.49 (*)    Hemoglobin 16.9 (*)    HCT 48.9 (*)    All other components within normal limits  URINALYSIS, ROUTINE W REFLEX MICROSCOPIC - Abnormal; Notable for the following:    Specific Gravity, Urine >1.030 (*)    Hgb urine dipstick MODERATE (*)    Leukocytes, UA SMALL (*)    All other components within normal limits  URINE MICROSCOPIC-ADD ON - Abnormal; Notable for the following:    Squamous Epithelial / LPF MANY (*)    Bacteria, UA FEW (*)    All other components within normal limits  COMPREHENSIVE METABOLIC PANEL  LIPASE, BLOOD    Imaging Review No results found.   EKG Interpretation None      MDM   Final diagnoses:  Abdominal cramping  Nausea  Diarrhea    I personally performed the services described in this documentation, which was scribed in my presence. The recorded information has been reviewed and is accurate.  Patient presents with intermittent diarrhea abdominal cramping. Clinically discussed enteritis, colitis, less likely diverticulitis with no focal left lower quadrant pain and no fever. Patient improved in the ER, IV fluids given pain meds given. Discussed CT scan versus close outpatient follow-up. Discussed risks of radiation, patient wishes and is comfortable with monitoring closely for 48 hours. Patient will return to the ER for worsening or no improvement for CT scan otherwise will follow-up with primary doctor on Monday. No peritonitis on recheck, minimal tenderness central.   Results and differential diagnosis were discussed with the patient/parent/guardian. Close follow up outpatient was discussed, comfortable with the plan.   Medications   ondansetron (ZOFRAN) injection 4 mg (4 mg Intravenous Given 08/19/14 1556)  morphine 4 MG/ML injection 6 mg (6 mg Intravenous Given 08/19/14 1556)    Filed Vitals:   08/19/14 1515 08/19/14 1530 08/19/14 1545 08/19/14 1600  BP:  124/84  119/85  Pulse: 102 99 98 104  Temp:      TempSrc:      Resp:      Height:      Weight:      SpO2: 97% 99% 98% 97%    Final diagnoses:  Abdominal cramping  Nausea  Diarrhea       Blane OharaJoshua Sanjuana Mruk, MD 08/19/14 [phone]

## 2014-08-19 NOTE — Discharge Instructions (Signed)
If your abdominal pain worsens, you develop fevers, persistent vomiting or if your pain moves to the right lower quadrant return immediately to see your physician or come to the Emergency Department.  Thank you If your symptoms worsen or not improved next 48 hours he need to come back to the ER or see your physician. Take Zofran as needed for nausea. For severe pain take norco or vicodin however realize they have the potential for addiction and it can make you sleepy and has tylenol in it.  No operating machinery while taking. If you were given medicines take as directed.  If you are on coumadin or contraceptives realize their levels and effectiveness is altered by many different medicines.  If you have any reaction (rash, tongues swelling, other) to the medicines stop taking and see a physician.   Please follow up as directed and return to the ER or see a physician for new or worsening symptoms.  Thank you. Filed Vitals:   08/19/14 1515 08/19/14 1530 08/19/14 1545 08/19/14 1600  BP:  124/84  119/85  Pulse: 102 99 98 104  Temp:      TempSrc:      Resp:      Height:      Weight:      SpO2: 97% 99% 98% 97%

## 2014-09-27 ENCOUNTER — Ambulatory Visit: Admit: 2014-09-27 | Disposition: A | Discharge status: Home / Self Care | Payer: Self-pay

## 2016-05-06 IMAGING — CR DG LUMBAR SPINE 2-3V
1 series · 3 of 3 positions shown · non-contrast
Comparison: None.

CLINICAL DATA: Chronic pain.

EXAM:
LUMBAR SPINE - 2-3 VIEW

[Series 1: dxr lumbar spine ap and lateral · 0.14mm/px · 3 of 3 slices shown]
[im 1/3]
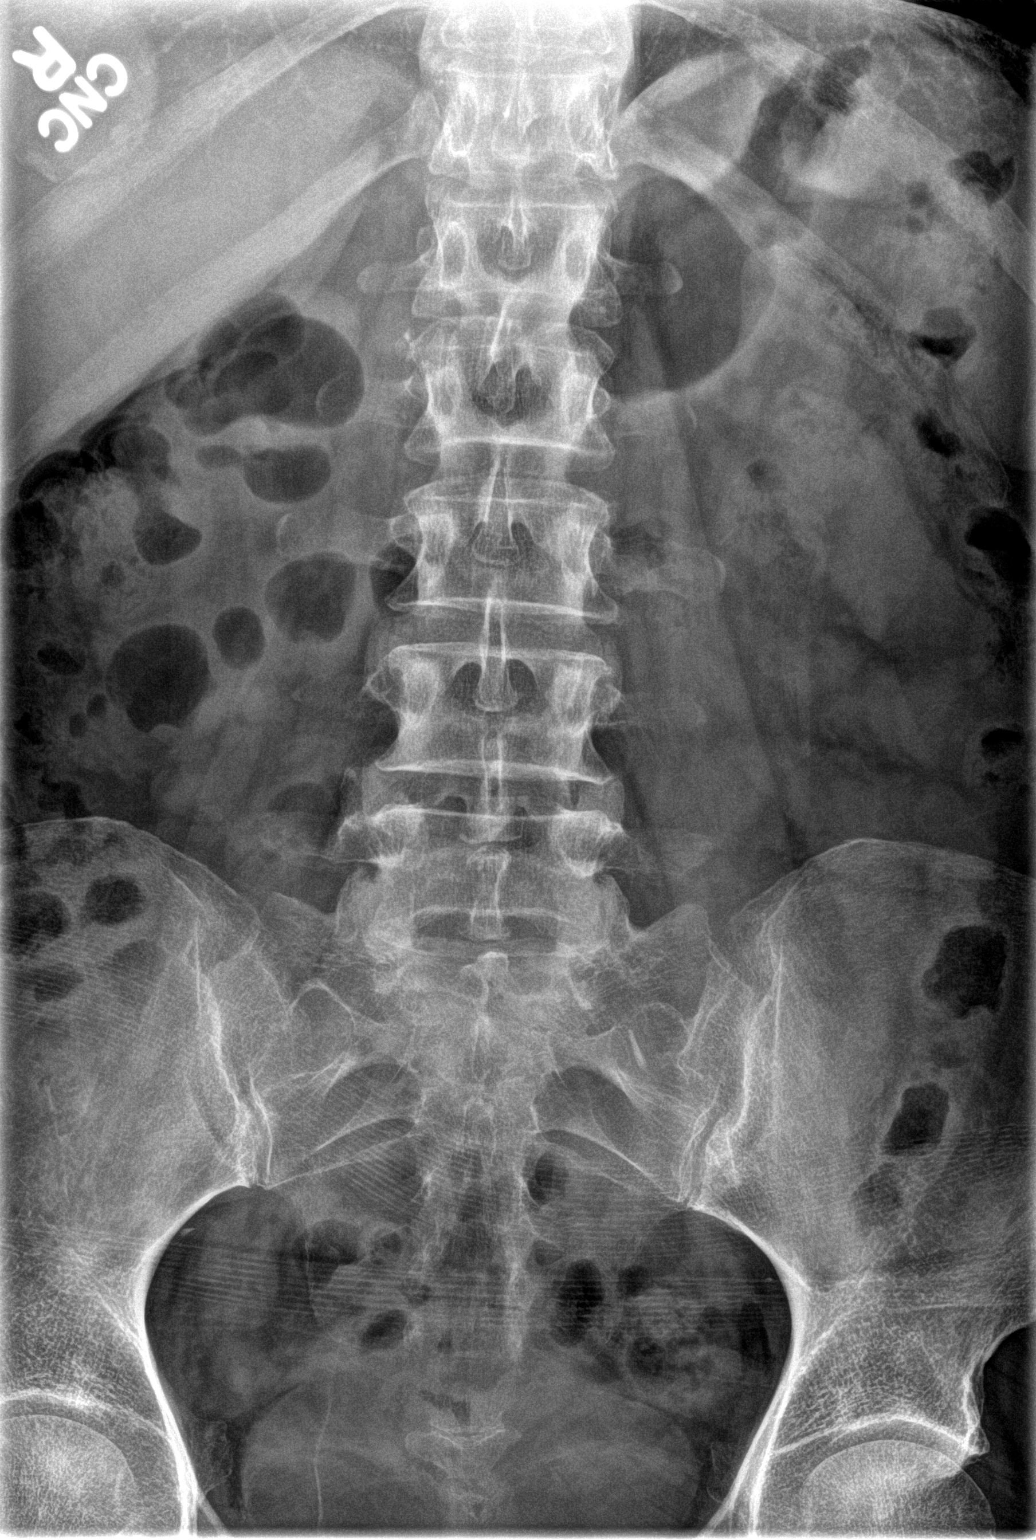
[im 2/3]
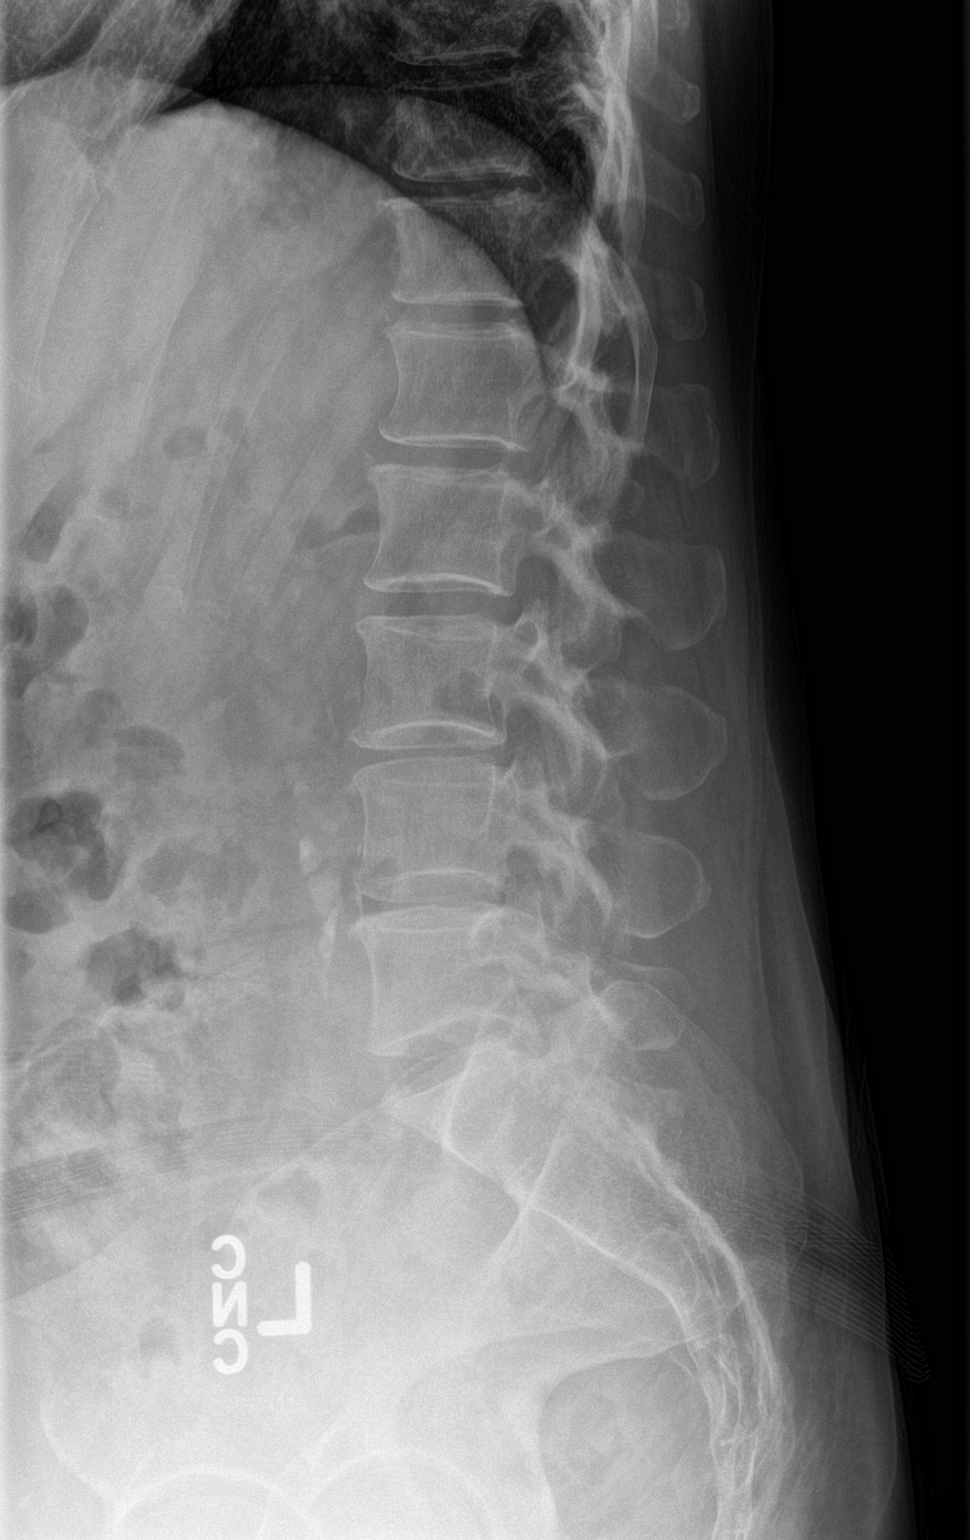
[im 3/3]
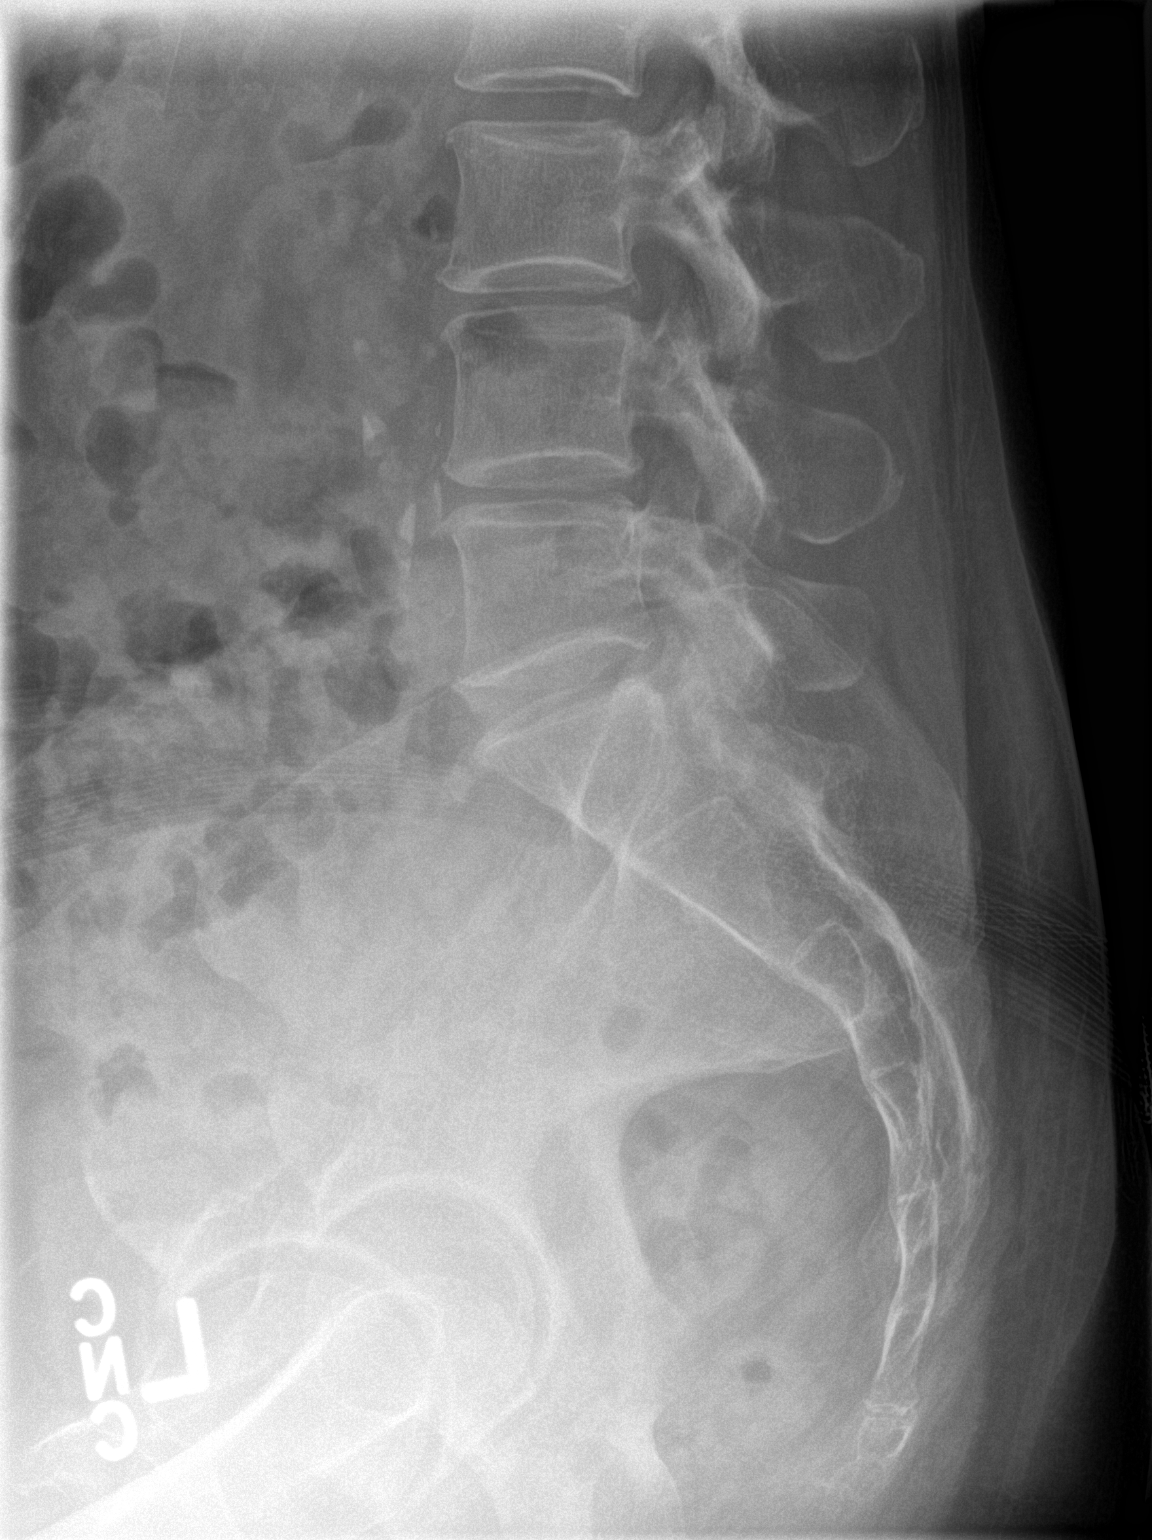

[3 of 3 positions shown; findings below may reference images not displayed]

FINDINGS: Paraspinal soft tissues are normal. Normal alignment. Mild
multilevel degenerative change. No acute bony abnormality.
Aortoiliac atherosclerotic vascular disease
IMPRESSION: 1.  Diffuse mild degenerative change.  No acute abnormality.

2.  Aortoiliac atherosclerotic vascular disease.

## 2016-05-06 IMAGING — CR DG SHOULDER 3+V*R*
1 series · 3 of 3 positions shown · non-contrast
Comparison: None.

CLINICAL DATA: Chronic right shoulder pain. No known recent injury.
Initial evaluation.

EXAM:
DG SHOULDER 3+ VIEWS RIGHT

[Series 1: dxr shoulder right complete · 0.14mm/px · 3 of 3 slices shown]
[im 1/3]
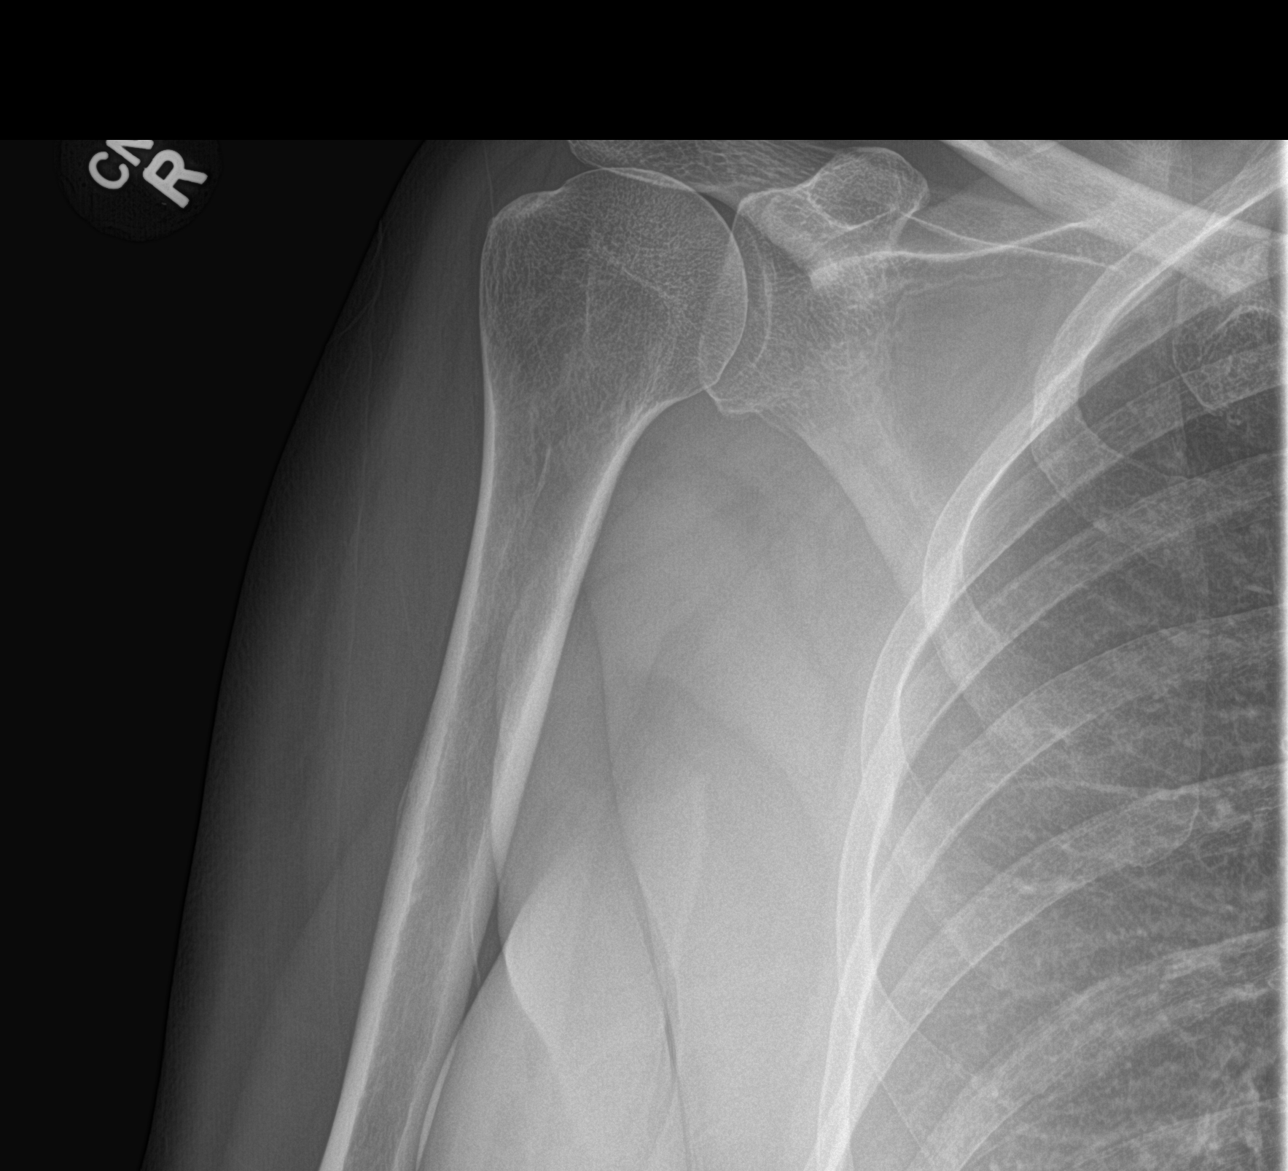
[im 2/3]
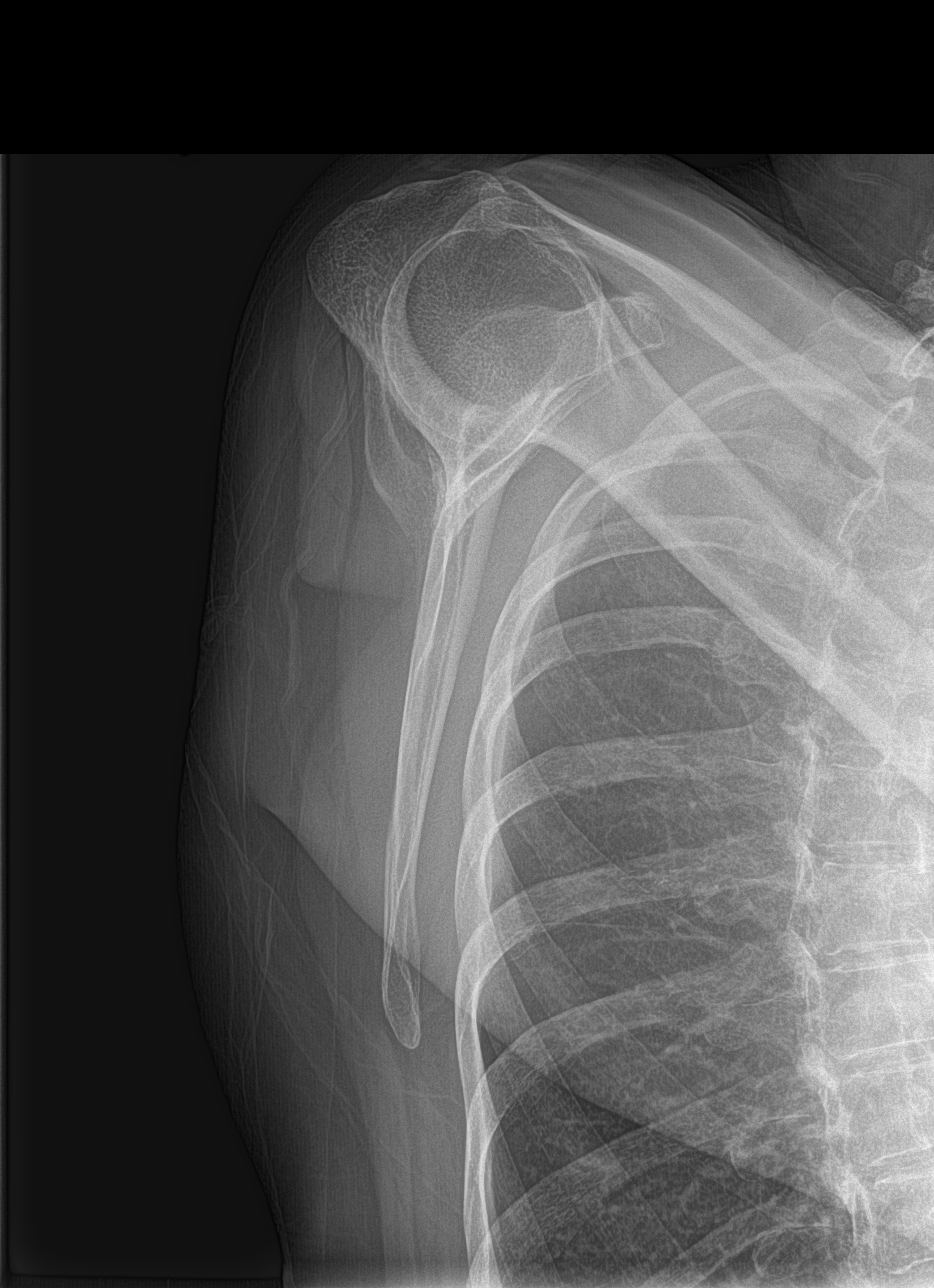
[im 3/3]
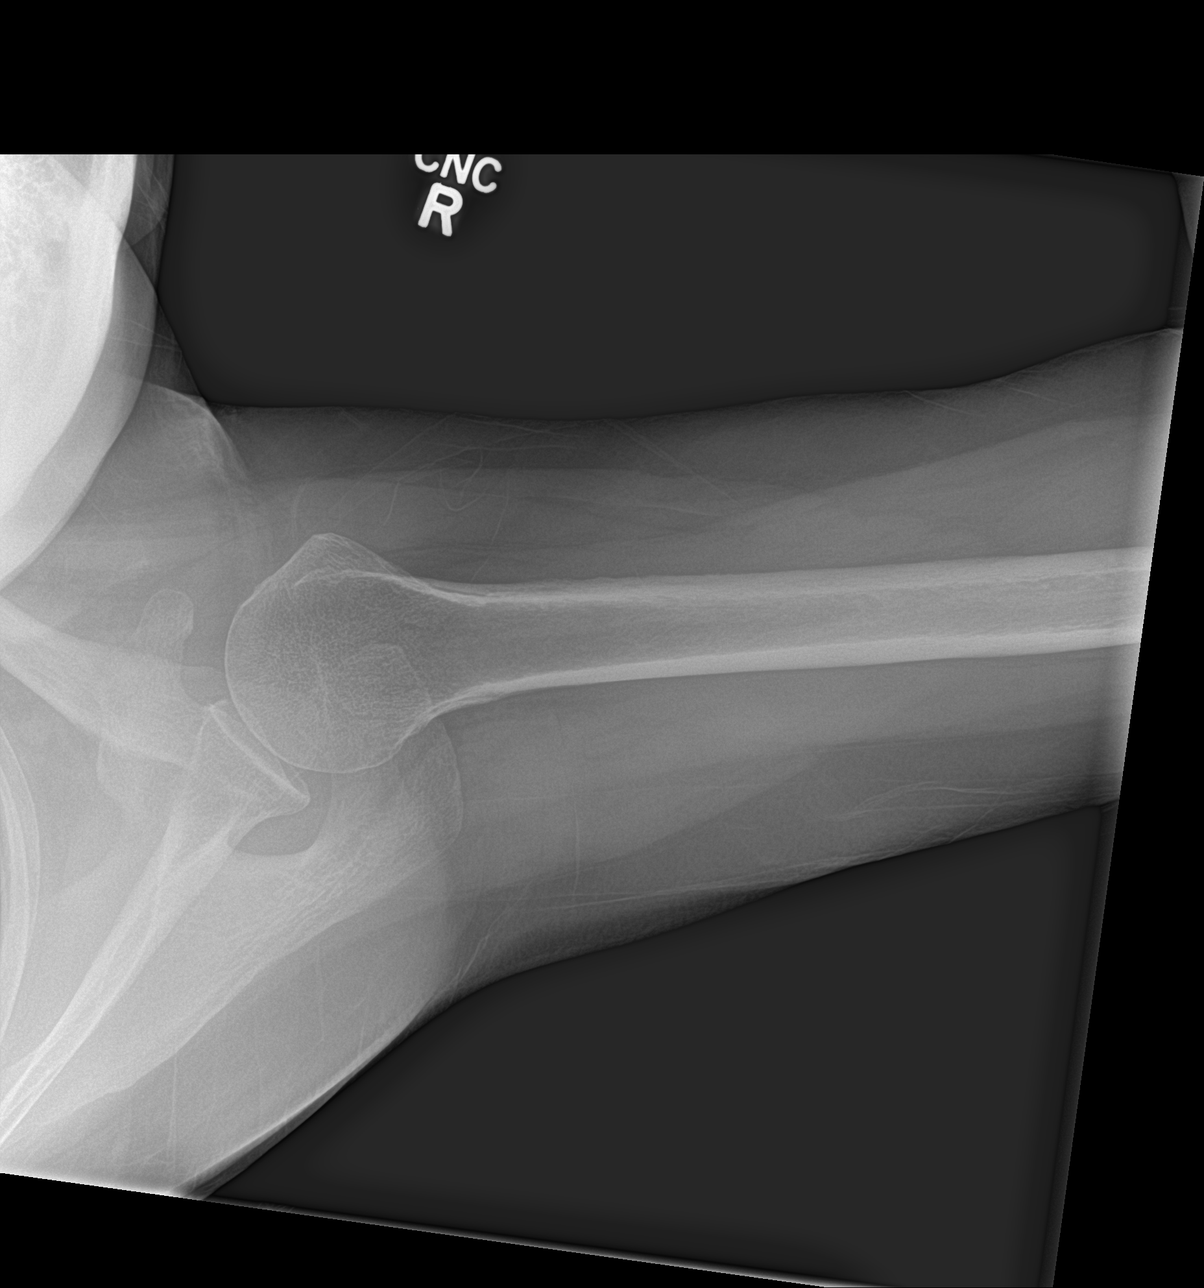

[3 of 3 positions shown; findings below may reference images not displayed]

FINDINGS: There is no evidence of fracture or dislocation. There is no
evidence of arthropathy or other focal bone abnormality. Soft
tissues are unremarkable.
IMPRESSION: Negative.

## 2017-03-20 ENCOUNTER — Other Ambulatory Visit: Payer: Self-pay | Admitting: Nurse Practitioner

## 2017-03-20 DIAGNOSIS — Z1231 Encounter for screening mammogram for malignant neoplasm of breast: Secondary | ICD-10-CM

## 2020-02-20 ENCOUNTER — Emergency Department (HOSPITAL_COMMUNITY): Payer: Medicaid Other

## 2020-02-20 ENCOUNTER — Inpatient Hospital Stay (HOSPITAL_COMMUNITY)
Admission: EM | Admit: 2020-02-20 | Discharge: 2020-02-23 | DRG: 190 | Disposition: A | Discharge status: Home / Self Care | Payer: Medicaid Other | Attending: Internal Medicine | Admitting: Internal Medicine

## 2020-02-20 ENCOUNTER — Encounter (HOSPITAL_COMMUNITY): Payer: Self-pay | Admitting: Emergency Medicine

## 2020-02-20 ENCOUNTER — Other Ambulatory Visit: Payer: Self-pay

## 2020-02-20 DIAGNOSIS — G894 Chronic pain syndrome: Secondary | ICD-10-CM | POA: Diagnosis present

## 2020-02-20 DIAGNOSIS — J9601 Acute respiratory failure with hypoxia: Secondary | ICD-10-CM | POA: Diagnosis present

## 2020-02-20 DIAGNOSIS — J441 Chronic obstructive pulmonary disease with (acute) exacerbation: Secondary | ICD-10-CM | POA: Diagnosis present

## 2020-02-20 DIAGNOSIS — Z720 Tobacco use: Secondary | ICD-10-CM

## 2020-02-20 DIAGNOSIS — M545 Low back pain: Secondary | ICD-10-CM | POA: Diagnosis present

## 2020-02-20 DIAGNOSIS — Z23 Encounter for immunization: Secondary | ICD-10-CM

## 2020-02-20 DIAGNOSIS — Z20822 Contact with and (suspected) exposure to covid-19: Secondary | ICD-10-CM | POA: Diagnosis present

## 2020-02-20 DIAGNOSIS — R6511 Systemic inflammatory response syndrome (SIRS) of non-infectious origin with acute organ dysfunction: Secondary | ICD-10-CM | POA: Diagnosis present

## 2020-02-20 DIAGNOSIS — F17210 Nicotine dependence, cigarettes, uncomplicated: Secondary | ICD-10-CM | POA: Diagnosis present

## 2020-02-20 DIAGNOSIS — R509 Fever, unspecified: Secondary | ICD-10-CM

## 2020-02-20 DIAGNOSIS — K219 Gastro-esophageal reflux disease without esophagitis: Secondary | ICD-10-CM | POA: Diagnosis present

## 2020-02-20 DIAGNOSIS — J471 Bronchiectasis with (acute) exacerbation: Principal | ICD-10-CM | POA: Diagnosis present

## 2020-02-20 DIAGNOSIS — R6510 Systemic inflammatory response syndrome (SIRS) of non-infectious origin without acute organ dysfunction: Secondary | ICD-10-CM

## 2020-02-20 LAB — CBC
HCT: 38.8 % (ref 36.0–46.0)
Hemoglobin: 12.8 g/dL (ref 12.0–15.0)
MCH: 29.6 pg (ref 26.0–34.0)
MCHC: 33 g/dL (ref 30.0–36.0)
MCV: 89.6 fL (ref 80.0–100.0)
Platelets: 198 10*3/uL (ref 150–400)
RBC: 4.33 MIL/uL (ref 3.87–5.11)
RDW: 13 % (ref 11.5–15.5)
WBC: 10.2 10*3/uL (ref 4.0–10.5)
nRBC: 0 % (ref 0.0–0.2)

## 2020-02-20 LAB — BASIC METABOLIC PANEL
Anion gap: 9 (ref 5–15)
BUN: 8 mg/dL (ref 6–20)
CO2: 28 mmol/L (ref 22–32)
Calcium: 8.8 mg/dL — ABNORMAL LOW (ref 8.9–10.3)
Chloride: 97 mmol/L — ABNORMAL LOW (ref 98–111)
Creatinine, Ser: 0.78 mg/dL (ref 0.44–1.00)
GFR calc Af Amer: >60 mL/min (ref >60)
GFR calc non Af Amer: >60 mL/min (ref >60)
Glucose, Bld: 95 mg/dL (ref 70–99)
Potassium: 3.9 mmol/L (ref 3.5–5.1)
Sodium: 134 mmol/L — ABNORMAL LOW (ref 135–145)

## 2020-02-20 LAB — SARS CORONAVIRUS 2 BY RT PCR (HOSPITAL ORDER, PERFORMED IN ~~LOC~~ HOSPITAL LAB): SARS Coronavirus 2: NEGATIVE

## 2020-02-20 MED ORDER — IPRATROPIUM BROMIDE 0.02 % IN SOLN
0.5000 mg | Freq: Once | RESPIRATORY_TRACT | Status: AC
  Administered 2020-02-20: 0.5 mg via RESPIRATORY_TRACT
  Filled 2020-02-20: qty 2.5

## 2020-02-20 MED ORDER — ACETAMINOPHEN 325 MG PO TABS
650.0000 mg | ORAL_TABLET | Freq: Once | ORAL | Status: AC
  Administered 2020-02-20: 650 mg via ORAL
  Filled 2020-02-20: qty 2

## 2020-02-20 MED ORDER — ALBUTEROL SULFATE HFA 108 (90 BASE) MCG/ACT IN AERS
4.0000 | INHALATION_SPRAY | Freq: Once | RESPIRATORY_TRACT | Status: AC
  Administered 2020-02-20: 4 via RESPIRATORY_TRACT
  Filled 2020-02-20: qty 6.7

## 2020-02-20 MED ORDER — PREDNISONE 50 MG PO TABS
60.0000 mg | ORAL_TABLET | Freq: Once | ORAL | Status: AC
  Administered 2020-02-20: 60 mg via ORAL
  Filled 2020-02-20: qty 1

## 2020-02-20 MED ORDER — DOXYCYCLINE HYCLATE 100 MG PO TABS
100.0000 mg | ORAL_TABLET | Freq: Once | ORAL | Status: AC
  Administered 2020-02-20: 100 mg via ORAL
  Filled 2020-02-20: qty 1

## 2020-02-20 MED ORDER — ALBUTEROL SULFATE (2.5 MG/3ML) 0.083% IN NEBU
5.0000 mg | INHALATION_SOLUTION | RESPIRATORY_TRACT | Status: DC | PRN
  Administered 2020-02-20: 5 mg via RESPIRATORY_TRACT
  Filled 2020-02-20: qty 6

## 2020-02-20 NOTE — ED Triage Notes (Signed)
Pt c/o cough, SOB, fever, and body aches. Pt has not been around anyone with COVID.

## 2020-02-20 NOTE — ED Provider Notes (Addendum)
[facility] EMERGENCY DEPARTMENT Provider Note   CSN: [id number] Arrival date & time: 02/20/20  1843     History Chief Complaint  Patient presents with  . Cough  . Shortness of Breath    Erin Hobbs is a 59 y.o. female.  HPI   Patient presents to the ED for evaluation of cough, congestion and body aches.  Patient states her symptoms started about 5 days ago.  She has no known Covid exposures.  She started having fevers today up to 102.  She has had some discomfort in her chest with coughing.  She has been wheezing.  Patient smokes and has COPD.  She has not been vaccinated for Covid.  Past Medical History:  Diagnosis Date  . Arthritis   . COPD (chronic obstructive pulmonary disease) (HCC)   . Ulcer     There are no problems to display for this patient.   Past Surgical History:  Procedure Laterality Date  . TUBAL LIGATION       OB History   No obstetric history on file.     No family history on file.  Social History   Tobacco Use  . Smoking status: Current Every Day Smoker    Packs/day: 0.50    Types: Cigarettes  . Smokeless tobacco: Never Used  Vaping Use  . Vaping Use: Never used  Substance Use Topics  . Alcohol use: No  . Drug use: No    Home Medications Prior to Admission medications   Medication Sig Start Date End Date Taking? Authorizing Provider  acetaminophen (TYLENOL) 500 MG tablet Take 1,000 mg by mouth every 6 (six) hours as needed for pain or fever.    [provider]  amoxicillin (AMOXIL) 500 MG capsule Take 1 capsule (500 mg total) by mouth 3 (three) times daily. Patient not taking: Reported on 08/19/2014 07/04/13   Bethann Berkshire, MD  HYDROcodone-acetaminophen [employer] [facility]) 5-325 MG per tablet Take 1-2 tablets by mouth every 4 (four) hours as needed. 08/19/14   Blane Ohara, MD  naproxen sodium (ANAPROX) 220 MG tablet Take 220 mg by mouth daily as needed (headache).    [provider]  ondansetron (ZOFRAN ODT) 4 MG  disintegrating tablet 4mg  ODT q4 hours prn nausea/vomit 08/19/14   10/19/14, MD  predniSONE (DELTASONE) 10 MG tablet Take 2 tablets (20 mg total) by mouth daily. Patient not taking: Reported on 08/19/2014 07/04/13   07/06/13, MD    Allergies    Aspirin and Mushroom extract complex  Review of Systems   Review of Systems  All other systems reviewed and are negative.   Physical Exam Updated Vital Signs BP 127/74   Pulse (!) 101   Temp 99.4 F (37.4 C) (Oral)   Resp (!) 22   Ht 1.626 m (5\' 4" )   Wt 73.9 kg   SpO2 90%   BMI 27.98 kg/m   Physical Exam Vitals and nursing note reviewed.  Constitutional:      General: She is not in acute distress. HENT:     Head: Normocephalic and atraumatic.     Right Ear: External ear normal.     Left Ear: External ear normal.  Eyes:     General: No scleral icterus.       Right eye: No discharge.        Left eye: No discharge.     Conjunctiva/sclera: Conjunctivae normal.  Neck:     Trachea: No tracheal deviation.  Cardiovascular:     Rate  and Rhythm: Normal rate and regular rhythm.  Pulmonary:     Effort: Pulmonary effort is normal. No respiratory distress.     Breath sounds: No stridor. Wheezing present. No rales.  Abdominal:     General: Bowel sounds are normal. There is no distension.     Palpations: Abdomen is soft.     Tenderness: There is no abdominal tenderness. There is no guarding or rebound.  Musculoskeletal:        General: No tenderness.     Cervical back: Neck supple.  Skin:    General: Skin is warm and dry.     Findings: No rash.  Neurological:     Mental Status: She is alert.     Cranial Nerves: No cranial nerve deficit (no facial droop, extraocular movements intact, no slurred speech).     Sensory: No sensory deficit.     Motor: No abnormal muscle tone or seizure activity.     Coordination: Coordination normal.     ED Results / Procedures / Treatments   Labs (all labs ordered are listed, but only  abnormal results are displayed) Labs Reviewed  BASIC METABOLIC PANEL - Abnormal; Notable for the following components:      Result Value   Sodium 134 (*)    Chloride 97 (*)    Calcium 8.8 (*)    All other components within normal limits  SARS CORONAVIRUS 2 BY RT PCR [facility] ORDER, PERFORMED IN [facility])  CBC    EKG EKG Interpretation  Date/Time:  Sunday February 20 2020 18:59:43 EDT Ventricular Rate:  107 PR Interval:  128 QRS Duration: 88 QT Interval:  310 QTC Calculation: 413 R Axis:   92 Text Interpretation: Sinus tachycardia Rightward axis Borderline ECG Since last tracing rate faster Confirmed by Linwood Dibbles [phone]) on 02/20/2020 7:38:15 PM   Radiology DG Chest Portable 1 View  Result Date: 02/20/2020 CLINICAL DATA:  Shortness of breath and cough.  Fever.  Body aches. EXAM: PORTABLE CHEST 1 VIEW COMPARISON:  July 04, 2013 FINDINGS: There is no focal infiltrate. No large pleural effusion. The heart size is unremarkable. There is no acute osseous abnormality. IMPRESSION: No active disease. Electronically Signed   By: Katherine Mantle M.D.   On: 02/20/2020 19:44    Procedures Procedures (including critical care time)  Medications Ordered in ED Medications  albuterol (PROVENTIL) (2.5 MG/3ML) 0.083% nebulizer solution 5 mg (5 mg Nebulization Given 02/20/20 2305)  albuterol (VENTOLIN HFA) 108 (90 Base) MCG/ACT inhaler 4 puff (4 puffs Inhalation Given 02/20/20 2246)  acetaminophen (TYLENOL) tablet 650 mg (650 mg Oral Given 02/20/20 2204)  doxycycline (VIBRA-TABS) tablet 100 mg (100 mg Oral Given 02/20/20 2302)  ipratropium (ATROVENT) nebulizer solution 0.5 mg (0.5 mg Nebulization Given 02/20/20 2305)  predniSONE (DELTASONE) tablet 60 mg (60 mg Oral Given 02/20/20 2301)    ED Course  I have reviewed the triage vital signs and the nursing notes.  Pertinent labs & imaging results that were available during my care of the patient were reviewed by me and  considered in my medical decision making (see chart for details).  Clinical Course as of Feb 20 2315  Wynelle Link Feb 20, 2020  2108 CXR without acute changes   [JK]  2249 Covid test is negative.  Oxygen saturation saturation is low 90s without supplemental oxygen but patient is breathing easily.  With her negative Covid test I will order nebulizer treatment now and a dose of steroids.   [JK]  2315 O2  sat did decrease earlier when ambulating.  Now in low 90s without oxygen supplementation.  Will try neb treatment.  Reassess following that   [JK]    Clinical Course User Index [JK] Linwood Dibbles, MD   MDM Rules/Calculators/A&P                          Pt presents with fever, cough, copd exacerbation.  covid negative.  Pt has borderline o2 sats, that decrease when walking.  Not on home o2.  Will try additional breathing treatments and see if her oxygenation improves so we can avoid hospitalization.    11:55 PM Pt with o2 sat in the high 80s at rest.  Will start her on oxygen.  Will need to admit for copd exacerbation Final Clinical Impression(s) / ED Diagnoses Final diagnoses:  COPD exacerbation (HCC)      Linwood Dibbles, MD 02/20/20 [place]    Linwood Dibbles, MD 02/20/20 2355

## 2020-02-20 NOTE — ED Notes (Signed)
Pt's O2 sats on room air were 82%. Pt with labored work of breathing. Placed pt on O2 @ 2L [place]. Sats 90%. Will continue to monitor.

## 2020-02-21 DIAGNOSIS — Z23 Encounter for immunization: Secondary | ICD-10-CM | POA: Diagnosis not present

## 2020-02-21 DIAGNOSIS — Z720 Tobacco use: Secondary | ICD-10-CM | POA: Diagnosis not present

## 2020-02-21 DIAGNOSIS — G894 Chronic pain syndrome: Secondary | ICD-10-CM | POA: Diagnosis present

## 2020-02-21 DIAGNOSIS — R6511 Systemic inflammatory response syndrome (SIRS) of non-infectious origin with acute organ dysfunction: Secondary | ICD-10-CM | POA: Diagnosis present

## 2020-02-21 DIAGNOSIS — R509 Fever, unspecified: Secondary | ICD-10-CM | POA: Diagnosis not present

## 2020-02-21 DIAGNOSIS — Z20822 Contact with and (suspected) exposure to covid-19: Secondary | ICD-10-CM | POA: Diagnosis present

## 2020-02-21 DIAGNOSIS — J441 Chronic obstructive pulmonary disease with (acute) exacerbation: Secondary | ICD-10-CM | POA: Diagnosis present

## 2020-02-21 DIAGNOSIS — R6510 Systemic inflammatory response syndrome (SIRS) of non-infectious origin without acute organ dysfunction: Secondary | ICD-10-CM | POA: Diagnosis not present

## 2020-02-21 DIAGNOSIS — J471 Bronchiectasis with (acute) exacerbation: Secondary | ICD-10-CM | POA: Diagnosis not present

## 2020-02-21 DIAGNOSIS — K219 Gastro-esophageal reflux disease without esophagitis: Secondary | ICD-10-CM | POA: Diagnosis present

## 2020-02-21 DIAGNOSIS — F17210 Nicotine dependence, cigarettes, uncomplicated: Secondary | ICD-10-CM | POA: Diagnosis present

## 2020-02-21 DIAGNOSIS — J9601 Acute respiratory failure with hypoxia: Secondary | ICD-10-CM

## 2020-02-21 DIAGNOSIS — M545 Low back pain: Secondary | ICD-10-CM | POA: Diagnosis present

## 2020-02-21 LAB — HIV ANTIBODY (ROUTINE TESTING W REFLEX): HIV Screen 4th Generation wRfx: NONREACTIVE

## 2020-02-21 MED ORDER — IPRATROPIUM-ALBUTEROL 0.5-2.5 (3) MG/3ML IN SOLN
3.0000 mL | Freq: Three times a day (TID) | RESPIRATORY_TRACT | Status: DC
  Administered 2020-02-22 – 2020-02-23 (×4): 3 mL via RESPIRATORY_TRACT
  Filled 2020-02-21 (×4): qty 3

## 2020-02-21 MED ORDER — METHYLPREDNISOLONE SODIUM SUCC 40 MG IJ SOLR
40.0000 mg | Freq: Two times a day (BID) | INTRAMUSCULAR | Status: DC
  Administered 2020-02-21 – 2020-02-22 (×3): 40 mg via INTRAVENOUS
  Filled 2020-02-21 (×3): qty 1

## 2020-02-21 MED ORDER — NICOTINE 21 MG/24HR TD PT24
21.0000 mg | MEDICATED_PATCH | Freq: Every day | TRANSDERMAL | Status: DC
  Administered 2020-02-21 – 2020-02-23 (×3): 21 mg via TRANSDERMAL
  Filled 2020-02-21 (×3): qty 1

## 2020-02-21 MED ORDER — AZITHROMYCIN 250 MG PO TABS
250.0000 mg | ORAL_TABLET | Freq: Every day | ORAL | Status: DC
  Administered 2020-02-22 – 2020-02-23 (×2): 250 mg via ORAL
  Filled 2020-02-21 (×2): qty 1

## 2020-02-21 MED ORDER — ENOXAPARIN SODIUM 40 MG/0.4ML ~~LOC~~ SOLN
40.0000 mg | Freq: Every day | SUBCUTANEOUS | Status: DC
  Administered 2020-02-21 – 2020-02-23 (×3): 40 mg via SUBCUTANEOUS
  Filled 2020-02-21 (×3): qty 0.4

## 2020-02-21 MED ORDER — DM-GUAIFENESIN ER 30-600 MG PO TB12
1.0000 | ORAL_TABLET | Freq: Two times a day (BID) | ORAL | Status: DC
  Administered 2020-02-21 – 2020-02-23 (×5): 1 via ORAL
  Filled 2020-02-21 (×5): qty 1

## 2020-02-21 MED ORDER — IPRATROPIUM-ALBUTEROL 0.5-2.5 (3) MG/3ML IN SOLN
3.0000 mL | Freq: Four times a day (QID) | RESPIRATORY_TRACT | Status: DC
  Administered 2020-02-21 (×4): 3 mL via RESPIRATORY_TRACT
  Filled 2020-02-21 (×4): qty 3

## 2020-02-21 MED ORDER — PANTOPRAZOLE SODIUM 40 MG PO TBEC
40.0000 mg | DELAYED_RELEASE_TABLET | Freq: Every day | ORAL | Status: DC
  Administered 2020-02-21 – 2020-02-23 (×3): 40 mg via ORAL
  Filled 2020-02-21 (×3): qty 1

## 2020-02-21 MED ORDER — ALBUTEROL SULFATE (2.5 MG/3ML) 0.083% IN NEBU: 2.5000 mg | INHALATION_SOLUTION | Freq: Four times a day (QID) | RESPIRATORY_TRACT | Status: DC | PRN

## 2020-02-21 MED ORDER — AZITHROMYCIN 250 MG PO TABS
500.0000 mg | ORAL_TABLET | Freq: Every day | ORAL | Status: AC
  Administered 2020-02-21: 500 mg via ORAL
  Filled 2020-02-21: qty 2

## 2020-02-21 NOTE — Plan of Care (Signed)
Patient is 59 year old female with history of COPD, ongoing tobacco abuse who was admitted this morning with worsening shortness of breath, wheezing, cough and sputum.  Patient has history of COPD and unfortunately continues to smoke cigarettes.  She has not been vaccinated for Covid.  Covid test in the ED was negative.  Patient started on Solu-Medrol for COPD exacerbation.  Patient seen and examined.  Vitals, labs, imaging reviewed.  Continue treatment with Solu-Medrol, nebulization, Mucinex, azithromycin.  Plan of care discussed with the patient. Patient counseled on smoking cessation.

## 2020-02-21 NOTE — Evaluation (Addendum)
Physical Therapy Evaluation Patient Details Name: CHAR FELTMAN MRN: [id number] DOB: [birth year] Today's Date: 02/21/2020   History of Present Illness  Erin Hobbs is a 59 y.o. female with medical history significant for COPD and tobacco abuse who presents to the emergency department due to 1 week onset of chest congestion, cough with occasional production of dirty brown mucus and body aches. This was associated with chest soreness from coughing. She also complained of wheezing, she continues to smoke cigarettes.  She had no relief with home breathing treatment and patient states that she has not been vaccinated for Covid.    Clinical Impression  The patient's O2 sat was 86% on RA and 95% on 2L [place], so 2L O2 reapplied. The patient was independent with supine to sit. She tolerated sitting EOB without LOB. She performed sit to stand and had mild balance deficits but was able to regain balance using hospital bed. The patient ambulated 120 feet RW min guard without AD with O2 remaining at 95%. She had mild gait balance deficits but was able to maintain safety throughout ambulation. Patient was left in bed after therapy- RN notified. Patient discharged from physical therapy to care of nursing for ambulation daily as tolerated for length of stay at venue listed below.     Follow Up Recommendations No PT follow up    Equipment Recommendations  None recommended by PT   Recommendations for Other Services       Precautions / Restrictions Precautions Precautions: Fall Restrictions Weight Bearing Restrictions: No      Mobility  Bed Mobility Overal bed mobility: Independent                Transfers Overall transfer level: Needs assistance Equipment used: None Transfers: Sit to/from Stand Sit to Stand: Min guard         General transfer comment: unsteadiness upon standing, used hospital bed for support  Ambulation/Gait Ambulation/Gait assistance: Supervision;Min guard Gait  Distance (Feet): 120 Feet Assistive device: None Gait Pattern/deviations: WFL(Within Functional Limits);Step-through pattern;Wide base of support     General Gait Details: slight unsteadiness on feet  Stairs            Wheelchair Mobility    Modified Rankin (Stroke Patients Only)       Balance Overall balance assessment: Needs assistance Sitting-balance support: No upper extremity supported;Feet unsupported Sitting balance-Leahy Scale: Good     Standing balance support: No upper extremity supported;During functional activity Standing balance-Leahy Scale: Good Standing balance comment: unsteadiness with slight staggering pattern during ambulation                             Pertinent Vitals/Pain Pain Assessment: No/denies pain    Home Living Family/patient expects to be discharged to:: Private residence Living Arrangements: Children;Other relatives Available Help at Discharge: Family;Available PRN/intermittently Type of Home: House Home Access: Stairs to enter Entrance Stairs-Rails: None Entrance Stairs-Number of Steps: 3 Home Layout: Two level;Able to live on main level with bedroom/bathroom Home Equipment: None Additional Comments: pt ambulates without AD at home    Prior Function Level of Independence: Independent         Comments: independent with driving, shopping, ADL's; community ambulator     Hand Dominance   Dominant Hand: Right    Extremity/Trunk Assessment   Upper Extremity Assessment Upper Extremity Assessment: Defer to OT evaluation    Lower Extremity Assessment Lower Extremity Assessment: Overall WFL for tasks assessed  Cervical / Trunk Assessment Cervical / Trunk Assessment: Normal  Communication   Communication: No difficulties  Cognition Arousal/Alertness: Awake/alert Behavior During Therapy: WFL for tasks assessed/performed Overall Cognitive Status: Within Functional Limits for tasks assessed                                         General Comments      Exercises     Assessment/Plan    PT Assessment Patent does not need any further PT services  PT Problem List Decreased strength;Decreased range of motion;Decreased activity tolerance;Decreased balance;Decreased mobility;Decreased coordination       PT Treatment Interventions      PT Goals (Current goals can be found in the Care Plan section)  Acute Rehab PT Goals Patient Stated Goal: go home PT Goal Formulation: With patient Time For Goal Achievement: 02/28/20 Potential to Achieve Goals: Good    Frequency     Barriers to discharge        Co-evaluation               AM-PAC PT "6 Clicks" Mobility  Outcome Measure Help needed turning from your back to your side while in a flat bed without using bedrails?: None Help needed moving from lying on your back to sitting on the side of a flat bed without using bedrails?: None Help needed moving to and from a bed to a chair (including a wheelchair)?: A Little Help needed standing up from a chair using your arms (e.g., wheelchair or bedside chair)?: None Help needed to walk in hospital room?: A Little Help needed climbing 3-5 steps with a railing? : A Little 6 Click Score: 21    End of Session   Activity Tolerance: Patient tolerated treatment well Patient left: in bed;with call bell/phone within reach Nurse Communication: Mobility status PT Visit Diagnosis: Unsteadiness on feet (R26.81);Other abnormalities of gait and mobility (R26.89);Muscle weakness (generalized) (M62.81)    Time: 6270-3500 PT Time Calculation (min) (ACUTE ONLY): 20 min   Charges:   PT Evaluation $PT Eval Moderate Complexity: 1 Mod PT Treatments $Therapeutic Activity: 8-22 mins        1:55 PM , 02/21/20 Lorin Picket, SPT Physical Therapy with [facility] [phone] office   During this treatment session, the therapist was present, participating in and  directing the treatment.  1:55 PM, 02/21/20 Ocie Bob, MPT Physical Therapist with [facility] [phone] office [phone] mobile phone

## 2020-02-21 NOTE — H&P (Signed)
History and Physical  Erin Hobbs GBT:[id number] DOB: 19-Jul-1960 DOA: 02/20/2020  Referring physician: Linwood Dibbles, MD PCP: Patient, No Pcp Per  Patient coming from: Home  Chief Complaint: Cough and shortness of breath  HPI: Erin Hobbs is a 59 y.o. female with medical history significant for COPD and tobacco abuse who presents to the emergency department due to 1 week onset of chest congestion, cough with occasional production of dirty brown mucus and body aches. This was associated with chest soreness from coughing. She also complained of wheezing, she continues to smoke cigarettes.  She had no relief with home breathing treatment and patient states that she has not been vaccinated for Covid.  ED Course:  In the emergency department, she was noted to be febrile, tachycardic and tachypneic. Work-up in the ED showed normal CBC and BMP, SARS coronavirus 2 was negative. Chest x-ray showed no active disease. Breathing treatment was provided, doxycycline was given. Hospitalist was asked to admit patient for further evaluation and management.  Review of Systems: Constitutional: Negative for chills and fever.  HENT: Negative for ear pain and sore throat.   Eyes: Negative for pain and visual disturbance.  Respiratory: Positive for cough and shortness of breath.   Cardiovascular: Positive for chest wall pain (from coughing).  Negative for palpitations.  Gastrointestinal: Negative for abdominal pain and vomiting.  Endocrine: Negative for polyphagia and polyuria.  Genitourinary: Negative for decreased urine volume, dysuria Musculoskeletal: Negative for arthralgias and back pain.  Skin: Negative for color change and rash.  Allergic/Immunologic: Negative for immunocompromised state.  Neurological: Negative for tremors, syncope, speech difficulty, weakness, light-headedness and headaches.  Hematological: Does not bruise/bleed easily.  All other systems reviewed and are negative    Past  Medical History:  Diagnosis Date  . Arthritis   . COPD (chronic obstructive pulmonary disease) (HCC)   . Ulcer    Past Surgical History:  Procedure Laterality Date  . TUBAL LIGATION      Social History:  reports that she has been smoking cigarettes. She has been smoking about 0.50 packs per day. She has never used smokeless tobacco. She reports that she does not drink alcohol and does not use drugs.   Allergies  Allergen Reactions  . Aspirin Other (See Comments)    Cannot take due to stomach ulcer  . Mushroom Extract Complex Other (See Comments)    Dizziness     No family history on file.   Prior to Admission medications   Medication Sig Start Date End Date Taking? Authorizing Provider  acetaminophen (TYLENOL) 500 MG tablet Take 1,000 mg by mouth every 6 (six) hours as needed for pain or fever.    [provider]  amoxicillin (AMOXIL) 500 MG capsule Take 1 capsule (500 mg total) by mouth 3 (three) times daily. Patient not taking: Reported on 08/19/2014 07/04/13   Bethann Berkshire, MD  HYDROcodone-acetaminophen [facility]) 5-325 MG per tablet Take 1-2 tablets by mouth every 4 (four) hours as needed. 08/19/14   Blane Ohara, MD  naproxen sodium (ANAPROX) 220 MG tablet Take 220 mg by mouth daily as needed (headache).    [provider]  ondansetron (ZOFRAN ODT) 4 MG disintegrating tablet 4mg  ODT q4 hours prn nausea/vomit 08/19/14   10/19/14, MD  predniSONE (DELTASONE) 10 MG tablet Take 2 tablets (20 mg total) by mouth daily. Patient not taking: Reported on 08/19/2014 07/04/13   07/06/13, MD    Physical Exam: BP (!) 103/59   Pulse 84  Temp 99.4 F (37.4 C) (Oral)   Resp 18   Ht 5\' 4"  (1.626 m)   Wt 73.9 kg   SpO2 95%   BMI 27.98 kg/m   . General: 59 y.o. year-old female well developed well nourished in no acute distress.  Alert and oriented x3. 46 HEENT: NCAT, EOMI . Neck: Supple, trachea medial . Cardiovascular: Regular rate and rhythm with no rubs  or gallops.  No thyromegaly or JVD noted.  No lower extremity edema. 2/4 pulses in all 4 extremities. Marland Kitchen Respiratory: Mild diffuse expiratory wheezing on auscultation.   . Abdomen: Soft, nontender nondistended with normal bowel sounds x4 quadrants. . Muskuloskeletal: No cyanosis, clubbing or edema noted bilaterally . Neuro: CN II-XII intact, strength, sensation, reflexes . Skin: No ulcerative lesions noted or rashes . Psychiatry: Judgement and insight appear normal. Mood is appropriate for condition and setting          Labs on Admission:  Basic Metabolic Panel: Recent Labs  Lab 02/20/20 2206  NA 134*  K 3.9  CL 97*  CO2 28  GLUCOSE 95  BUN 8  CREATININE 0.78  CALCIUM 8.8*   Liver Function Tests: No results for input(s): AST, ALT, ALKPHOS, BILITOT, PROT, ALBUMIN in the last 168 hours. No results for input(s): LIPASE, AMYLASE in the last 168 hours. No results for input(s): AMMONIA in the last 168 hours. CBC: Recent Labs  Lab 02/20/20 2206  WBC 10.2  HGB 12.8  HCT 38.8  MCV 89.6  PLT 198   Cardiac Enzymes: No results for input(s): CKTOTAL, CKMB, CKMBINDEX, TROPONINI in the last 168 hours.  BNP (last 3 results) No results for input(s): BNP in the last 8760 hours.  ProBNP (last 3 results) No results for input(s): PROBNP in the last 8760 hours.  CBG: No results for input(s): GLUCAP in the last 168 hours.  Radiological Exams on Admission: DG Chest Portable 1 View  Result Date: 02/20/2020 CLINICAL DATA:  Shortness of breath and cough.  Fever.  Body aches. EXAM: PORTABLE CHEST 1 VIEW COMPARISON:  July 04, 2013 FINDINGS: There is no focal infiltrate. No large pleural effusion. The heart size is unremarkable. There is no acute osseous abnormality. IMPRESSION: No active disease. Electronically Signed   By: July 06, 2013 M.D.   On: 02/20/2020 19:44    EKG: I independently viewed the EKG done and my findings are as followed: Sinus tach at a rate of 107  bpm  Assessment/Plan Present on Admission: . COPD with acute exacerbation (HCC)  Principal Problem:   Acute respiratory failure with hypoxia (HCC) Active Problems:   COPD with acute exacerbation (HCC)   Fever   Tobacco abuse   SIRS (systemic inflammatory response syndrome) (HCC)  Acute respiratory failure with hypoxia possibly due to COPD with acute exacerbation Continue duo nebs, albuterol, Mucinex, Solu-Medrol, azithromycin. Continue Protonix to prevent steroid-induced ulcer Continue incentive spirometry and flutter valve Continue supplemental oxygen to maintain O2 sat > 92% with plan to wean patient off oxygen as tolerated  SIRS without convincing evidence of sepsis Acute fever rule out acute bronchitis Patient was initially tachycardic, tachypneic and febrile (meet SIRS criteria), however chest x-ray showed no active pulmonary disease.  Tachypnea and tachycardia could be due to patient's hypoxic state.   Patient does not appear septic clinically. Continue Tylenol 650 mg p.o. every 6 hours as needed  Tobacco abuse Patient counseled on tobacco abuse cessation Continue nicotine patch   DVT prophylaxis: Lovenox  Code Status: Full code  Family Communication:  None at bedside  Disposition Plan:  Patient is from:                        home Anticipated DC to:                   Home Anticipated DC date:               1 day Anticipated DC barriers:          Patient unstable for discharge at this time due to respiratory failure with hypoxia requiring supplemental oxygen  Consults called: None  Admission status: Observation    Frankey Shown MD [facility]  If 7PM-7AM, please contact night-coverage [email or web address] Password [facility]  02/21/2020, 4:13 AM

## 2020-02-21 NOTE — ED Notes (Signed)
Pt resting quietly.  States she is breathing better.   Auscultate some audible wheezes.  No distress.

## 2020-02-22 MED ORDER — PNEUMOCOCCAL VAC POLYVALENT 25 MCG/0.5ML IJ INJ
0.5000 mL | INJECTION | INTRAMUSCULAR | Status: AC
  Administered 2020-02-23: 0.5 mL via INTRAMUSCULAR
  Filled 2020-02-22: qty 0.5

## 2020-02-22 MED ORDER — ENSURE ENLIVE PO LIQD
237.0000 mL | Freq: Two times a day (BID) | ORAL | Status: DC
  Administered 2020-02-23: 237 mL via ORAL

## 2020-02-22 MED ORDER — METHYLPREDNISOLONE SODIUM SUCC 40 MG IJ SOLR
40.0000 mg | Freq: Three times a day (TID) | INTRAMUSCULAR | Status: DC
  Administered 2020-02-22 – 2020-02-23 (×2): 40 mg via INTRAVENOUS
  Filled 2020-02-22 (×2): qty 1

## 2020-02-22 MED ORDER — ALBUTEROL SULFATE (2.5 MG/3ML) 0.083% IN NEBU: 2.5000 mg | INHALATION_SOLUTION | RESPIRATORY_TRACT | Status: DC | PRN

## 2020-02-22 MED ORDER — ADULT MULTIVITAMIN W/MINERALS CH
1.0000 | ORAL_TABLET | Freq: Every day | ORAL | Status: DC
  Administered 2020-02-23: 1 via ORAL
  Filled 2020-02-22: qty 1

## 2020-02-22 NOTE — Progress Notes (Signed)
Initial Nutrition Assessment  DOCUMENTATION CODES:   Not applicable  INTERVENTION:  Ensure Enlive po BID, each supplement provides 350 kcal and 20 grams of protein  MVI with minerals daily   NUTRITION DIAGNOSIS:   Increased nutrient needs related to acute illness, chronic illness (acute respiratory failure with hypoxia; COPD) as evidenced by estimated needs.   GOAL:   Patient will meet greater than or equal to 90% of their needs    MONITOR:   Weight trends, Supplement acceptance, PO intake, Labs, I & O's  REASON FOR ASSESSMENT:   Consult Assessment of nutrition requirement/status  ASSESSMENT:  59 year old female with history significant of COPD, tobacco abuse, and arthritis presented with 1 week history of chest congestion with occasional production of brown mucus associated chest soreness from coughing, wheezing, and body aches admitted for acute respiratory failure with hypoxia.  RD working remotely d/t patient on airborne/contact precautions. Attempted to reach pt via phone, however she did answer this afternoon, unable to obtain nutrition history at this time. She is on a regular diet, no documented meals for review. Suspect pt with decreased po intake given ongoing shortness of breath with wheezing and cough requiring supplemental oxygen. Will order Ensure supplement and MVI to aid with meeting needs.   Current wt 157.96 lb No recent wt history for review, last wt prior to admission 65.8 kg (144.76 lb) on 08/19/2014  Medications reviewed and include: Zithromax, Mucinex DM, Methylprednisolone, Protonix Labs: Na 134 (L)   NUTRITION - FOCUSED PHYSICAL EXAM: Unable to complete at this time   Diet Order:   Diet Order            Diet regular Room service appropriate? Yes; Fluid consistency: Thin  Diet effective now                 EDUCATION NEEDS:   No education needs have been identified at this time  Skin:  Skin Assessment: Reviewed RN Assessment  Last  BM:  pta  Height:   Ht Readings from Last 1 Encounters:  02/22/20 5\' 4"  (1.626 m)    Weight:   Wt Readings from Last 1 Encounters:  02/22/20 71.8 kg    Ideal Body Weight:  54.5 kg  BMI:  Body mass index is 27.17 kg/m.  Estimated Nutritional Needs:   Kcal:  1900-2100  Protein:  95-105  Fluid:  >/=1.9 L   02/24/20, RD, LDN Clinical Nutrition After Hours/Weekend Pager # in Amion

## 2020-02-22 NOTE — Progress Notes (Signed)
Patient c/o back pain and there's no PRN pain med for her Notified Dr. Mariea Clonts  wiith no response. Will try again later.

## 2020-02-22 NOTE — ED Notes (Signed)
Pt resting with easy respirations

## 2020-02-22 NOTE — Progress Notes (Signed)
PROGRESS NOTE    [name]  SHF:[id number] DOB: 22-Dec-1960 DOA: 02/20/2020 PCP: Patient, No Pcp Per   Chief Complaint  Patient presents with   Cough   Shortness of Breath    Brief Narrative:  Erin Hobbs is a 59 y.o. female with medical history significant for COPD and tobacco abuse who presents to the emergency department due to 1 week onset of chest congestion, cough with occasional production of dirty brown mucus and body aches. This was associated with chest soreness from coughing. She also complained of wheezing, she continues to smoke cigarettes.  She had no relief with home breathing treatment and patient states that she has not been vaccinated for Covid.  ED Course:  In the emergency department, she was noted to be febrile, tachycardic and tachypneic. Work-up in the ED showed normal CBC and BMP, SARS coronavirus 2 was negative. Chest x-ray showed no active disease. Breathing treatment was provided, doxycycline was given.   Assessment & Plan:   Principal Problem:   Acute respiratory failure with hypoxia (HCC) Active Problems:   COPD with acute exacerbation (HCC)   Fever   Tobacco abuse   SIRS (systemic inflammatory response syndrome) (HCC)  Acute respiratory failure with hypoxia possibly due to COPD with acute exacerbation -She is continuing to complain of shortness of breath with wheezing.  Requiring oxygen.   Continue duo nebs, albuterol, Mucinex, Solu-Medrol, azithromycin. -Her Solu-Medrol was tapered but today she is having worsening symptoms therefore will increase the Solu-Medrol dose. Continue Protonix to prevent steroid-induced ulcer Continue incentive spirometry and flutter valve, mucinex Continue supplemental oxygen to maintain O2 sat > 92% with plan to wean patient off oxygen as tolerated  SIRS without convincing evidence of sepsis Acute fever rule out acute bronchitis Patient was initially tachycardic, tachypneic and febrile (meet SIRS criteria),  however chest x-ray showed no active pulmonary disease.  Tachypnea and tachycardia could be due to patient's hypoxic state.   Patient does not appear septic clinically. Continue Tylenol 650 mg p.o. every 6 hours as needed  Tobacco abuse Patient counseled on tobacco abuse cessation Continue nicotine patch   DVT prophylaxis: Lovenox  Code Status: Full code  Family Communication:  Patient awake and oriented.  Discussed with the patient.  Disposition Plan:  Patient is from:home Anticipated DC FO:YDXA Anticipated DC date: 1- 2 days Anticipated DC barriers: Patient unstable for discharge at this time due to respiratory failure with hypoxia requiring supplemental oxygen    Consultants:   None  Subjective: She is continuing to complain of shortness of breath with wheezing and cough.  Objective: Vitals:   02/21/20 2340 02/22/20 0544 02/22/20 0759 02/22/20 0838  BP: 120/67 130/66  128/67  Pulse: 88 73  92  Resp: 18 18  19   Temp:    98.3 F (36.8 C)  TempSrc:    Oral  SpO2: 93% 94% 94% 94%  Weight:    71.8 kg  Height:    5\' 4"  (1.626 m)   No intake or output data in the 24 hours ending 02/22/20 1227 Filed Weights   02/20/20 1900 02/22/20 0838  Weight: 73.9 kg 71.8 kg    Examination:  General exam: Complaining of shortness of breath, oriented x3 Respiratory system: Decreased breath sounds lower lobes, expiratory wheezing Cardiovascular system: S1 & S2. No pedal edema. Gastrointestinal system: Abdomen is nondistended, soft and nontender. No organomegaly or masses felt. Normal bowel sounds heard. Central nervous system: Alert and oriented. No focal neurological deficits. Extremities: Symmetric 5  x 5 power. Skin: No rashes, lesions or ulcers Psychiatry: Judgement and insight appear normal. Mood & affect appropriate.     Data Reviewed: I have personally reviewed following labs and imaging  studies  CBC: Recent Labs  Lab 02/20/20 2206  WBC 10.2  HGB 12.8  HCT 38.8  MCV 89.6  PLT 198    Basic Metabolic Panel: Recent Labs  Lab 02/20/20 2206  NA 134*  K 3.9  CL 97*  CO2 28  GLUCOSE 95  BUN 8  CREATININE 0.78  CALCIUM 8.8*    GFR: Estimated Creatinine Clearance: 73.5 mL/min (by C-G formula based on SCr of 0.78 mg/dL).  Liver Function Tests: No results for input(s): AST, ALT, ALKPHOS, BILITOT, PROT, ALBUMIN in the last 168 hours.  CBG: No results for input(s): GLUCAP in the last 168 hours.   Recent Results (from the past 240 hour(s))  SARS Coronavirus 2 by RT PCR (hospital order, performed in [facility] hospital lab) Nasopharyngeal Nasopharyngeal Swab     Status: None   Collection Time: 02/20/20  9:00 PM   Specimen: Nasopharyngeal Swab  Result Value Ref Range Status   SARS Coronavirus 2 NEGATIVE NEGATIVE Final    Comment: (NOTE) SARS-CoV-2 target nucleic acids are NOT DETECTED.  The SARS-CoV-2 RNA is generally detectable in upper and lower respiratory specimens during the acute phase of infection. The lowest concentration of SARS-CoV-2 viral copies this assay can detect is 250 copies / mL. A negative result does not preclude SARS-CoV-2 infection and should not be used as the sole basis for treatment or other patient management decisions.  A negative result may occur with improper specimen collection / handling, submission of specimen other than nasopharyngeal swab, presence of viral mutation(s) within the areas targeted by this assay, and inadequate number of viral copies (<250 copies / mL). A negative result must be combined with clinical observations, patient history, and epidemiological information.  Fact Sheet for Patients:   BoilerBrush.com.cy  Fact Sheet for Healthcare Providers: https://pope.com/  This test is not yet approved or  cleared by the [place] FDA and has been authorized  for detection and/or diagnosis of SARS-CoV-2 by FDA under an Emergency Use Authorization (EUA).  This EUA will remain in effect (meaning this test can be used) for the duration of the COVID-19 declaration under Section 564(b)(1) of the Act, 21 U.S.C. section 360bbb-3(b)(1), unless the authorization is terminated or revoked sooner.  Performed at [facility], [address], [place], [place] [address]       Radiology Studies: DG Chest Portable 1 View  Result Date: 02/20/2020 CLINICAL DATA:  Shortness of breath and cough.  Fever.  Body aches. EXAM: PORTABLE CHEST 1 VIEW COMPARISON:  July 04, 2013 FINDINGS: There is no focal infiltrate. No large pleural effusion. The heart size is unremarkable. There is no acute osseous abnormality. IMPRESSION: No active disease. Electronically Signed   By: Katherine Mantle M.D.   On: 02/20/2020 19:44    Scheduled Meds:  azithromycin  250 mg Oral Daily   dextromethorphan-guaiFENesin  1 tablet Oral BID   enoxaparin (LOVENOX) injection  40 mg Subcutaneous Daily   ipratropium-albuterol  3 mL Nebulization TID   methylPREDNISolone (SOLU-MEDROL) injection  40 mg Intravenous Q12H   nicotine  21 mg Transdermal Daily   pantoprazole  40 mg Oral Daily   [START ON 02/23/2020] pneumococcal 23 valent vaccine  0.5 mL Intramuscular Tomorrow-1000   Continuous Infusions:   LOS: 1 day   Vonzella Nipple, MD [facility]  To contact the attending provider between 7A-7P or the covering provider during after hours 7P-7A, please log into the web site [email or web address] and access using universal [place] password for that web site. If you do not have the password, please call the hospital operator.  02/22/2020, 12:27 PM

## 2020-02-23 DIAGNOSIS — K219 Gastro-esophageal reflux disease without esophagitis: Secondary | ICD-10-CM

## 2020-02-23 DIAGNOSIS — J471 Bronchiectasis with (acute) exacerbation: Principal | ICD-10-CM

## 2020-02-23 LAB — CBC
HCT: 42.2 % (ref 36.0–46.0)
Hemoglobin: 13.3 g/dL (ref 12.0–15.0)
MCH: 29 pg (ref 26.0–34.0)
MCHC: 31.5 g/dL (ref 30.0–36.0)
MCV: 92.1 fL (ref 80.0–100.0)
Platelets: 259 10*3/uL (ref 150–400)
RBC: 4.58 MIL/uL (ref 3.87–5.11)
RDW: 13.2 % (ref 11.5–15.5)
WBC: 19.6 10*3/uL — ABNORMAL HIGH (ref 4.0–10.5)
nRBC: 0 % (ref 0.0–0.2)

## 2020-02-23 LAB — BASIC METABOLIC PANEL
Anion gap: 10 (ref 5–15)
BUN: 20 mg/dL (ref 6–20)
CO2: 27 mmol/L (ref 22–32)
Calcium: 8.9 mg/dL (ref 8.9–10.3)
Chloride: 103 mmol/L (ref 98–111)
Creatinine, Ser: 0.76 mg/dL (ref 0.44–1.00)
GFR calc Af Amer: >60 mL/min (ref >60)
GFR calc non Af Amer: >60 mL/min (ref >60)
Glucose, Bld: 129 mg/dL — ABNORMAL HIGH (ref 70–99)
Potassium: 4.3 mmol/L (ref 3.5–5.1)
Sodium: 140 mmol/L (ref 135–145)

## 2020-02-23 MED ORDER — HYDROCODONE-ACETAMINOPHEN 10-325 MG PO TABS
1.0000 | ORAL_TABLET | Freq: Three times a day (TID) | ORAL | Status: DC | PRN
  Administered 2020-02-23: 1 via ORAL
  Filled 2020-02-23: qty 1

## 2020-02-23 MED ORDER — PANTOPRAZOLE SODIUM 40 MG PO TBEC: 40.0000 mg | DELAYED_RELEASE_TABLET | Freq: Every day | ORAL | 1 refills | Status: DC

## 2020-02-23 MED ORDER — PREDNISONE 20 MG PO TABS: ORAL_TABLET | ORAL | 0 refills | Status: DC

## 2020-02-23 MED ORDER — NICOTINE 21 MG/24HR TD PT24: 21.0000 mg | MEDICATED_PATCH | Freq: Every day | TRANSDERMAL | 0 refills | Status: DC

## 2020-02-23 MED ORDER — AZITHROMYCIN 250 MG PO TABS: 250.0000 mg | ORAL_TABLET | Freq: Every day | ORAL | 0 refills | Status: DC

## 2020-02-23 MED ORDER — DM-GUAIFENESIN ER 30-600 MG PO TB12: 1.0000 | ORAL_TABLET | Freq: Two times a day (BID) | ORAL | 0 refills | Status: AC

## 2020-02-23 MED ORDER — IPRATROPIUM-ALBUTEROL 20-100 MCG/ACT IN AERS: 1.0000 | INHALATION_SPRAY | Freq: Four times a day (QID) | RESPIRATORY_TRACT | 2 refills | Status: AC

## 2020-02-23 MED ORDER — IPRATROPIUM-ALBUTEROL 0.5-2.5 (3) MG/3ML IN SOLN: 3.0000 mL | Freq: Four times a day (QID) | RESPIRATORY_TRACT | 1 refills | Status: AC | PRN

## 2020-02-23 NOTE — Discharge Summary (Signed)
Physician Discharge Summary  Erin Hobbs ZHY:[id number] DOB: [birth year] DOA: 02/20/2020  PCP: Patient, No Pcp Per  Admit date: 02/20/2020 Discharge date: 02/23/2020  Time spent: 35 minutes  Recommendations for Outpatient Follow-up:  1. Repeat basic metabolic panel to follow electrolytes and renal function 2. Continue assisting patient with tobacco cessation 3. Outpatient follow-up with pulmonologist recommended in order to repeat PFTs and to further adjust COPD maintenance treatment as required.   Discharge Diagnoses:  Principal Problem:   Acute respiratory failure with hypoxia (HCC) Active Problems:   COPD with acute exacerbation (HCC)   Fever   Tobacco abuse   SIRS (systemic inflammatory response syndrome) (HCC)   Gastroesophageal reflux disease   Bronchiectasis with acute exacerbation (HCC) Chronic pain syndrome  Discharge Condition: Stable and improved.  Discharged home with instruction to follow-up with PCP in 10 days.  CODE STATUS: Full code.  Diet recommendation: Regular diet  Filed Weights   02/20/20 1900 02/22/20 0838  Weight: 73.9 kg 71.8 kg    History of present illness:  As per H&P written by Dr. Thomes Dinning on 02/21/2020 Erin Hobbs is a 59 y.o. female with medical history significant for COPD and tobacco abuse who presents to the emergency department due to 1 week onset of chest congestion, cough with occasional production of dirty brown mucus and body aches. This was associated with chest soreness from coughing. She also complained of wheezing, she continues to smoke cigarettes.  She had no relief with home breathing treatment and patient states that she has not been vaccinated for Covid.  ED Course:  In the emergency department, she was noted to be febrile, tachycardic and tachypneic. Work-up in the ED showed normal CBC and BMP, SARS coronavirus 2 was negative. Chest x-ray showed no active disease. Breathing treatment was provided, doxycycline was given.  Hospitalist was asked to admit patient for further evaluation and management.  Hospital Course:  1-acute hypoxemic respiratory failure in the setting of COPD and bronchiectasis exacerbation -Low-grade temperature initially appreciated at time of admission -Patient with SIRS criteria made secondary to COPD exacerbation. -Condition improve after nebulizer management, antibiotics and oxygen supplementation -At discharge speaking in full sentences, chills mild wheezing appreciated on examination and positive scattered rhonchi. -Patient discharged home on oral Zithromax to complete antibiotic therapy, steroids tapering, Mucinex, resumption of her rescue and maintenance inhaler therapy (albuterol and Symbicort); and also with prescription for nebulizer solutions to use in case inhaler unable to relieve symptoms of shortness of breath and wheezing after discharge (DuoNeb). -Patient advised to stop smoking. -Outpatient follow-up with PCP in 10 days.  2-tobacco abuse -Discharged on nicotine patch -Patient received smoking cessation counseling.  3-gastroesophageal reflux disease -Discharged on PPI.  4-chronic pain syndrome (lower back). -Continue home analgesic regimen and gabapentin.  Procedures:  See below for x-ray reports  Consultations:  None  Discharge Exam: Vitals:   02/23/20 1431 02/23/20 1540  BP:  139/73  Pulse:  90  Resp:  18  Temp:  97.9 F (36.6 C)  SpO2: (!) 87% 94%    General: Afebrile, no chest pain, no nausea, no vomiting.  Reports improvement in her breathing; even is still short winded and requiring 2 L nasal cannula supplementation mainly on exertion to maintain O2 sat above 89%.  Patient was able to speak in full sentences. Cardiovascular: S1 and S2, no rubs, no gallops, no JVD on exam. Respiratory: Improved air movement bilaterally; positive rhonchi appreciated diffusely, very little expiratory wheezing on exam.  No using accessory muscles.  Abdomen: Soft,  nontender, distended, positive bowel sounds Extremities: No cyanosis or clubbing.  Discharge Instructions   Discharge Instructions    Discharge instructions   Complete by: As directed    Maintain adequate hydration Stop smoking Follow-up with PCP in 10 days Use inhaler/nebulizer management as prescribed. Increase activity as tolerated while pacing your self and listening to your body.     Allergies as of 02/23/2020      Reactions   Aspirin Other (See Comments)   Cannot take due to stomach ulcer   Mushroom Extract Complex Other (See Comments)   Dizziness       Medication List    TAKE these medications   amphetamine-dextroamphetamine 10 MG tablet Commonly known as: ADDERALL Take 10 mg by mouth every other day.   azithromycin 250 MG tablet Commonly known as: ZITHROMAX Take 1 tablet (250 mg total) by mouth daily.   budesonide-formoterol 160-4.5 MCG/ACT inhaler Commonly known as: SYMBICORT Inhale 2 puffs into the lungs 2 (two) times daily.   [facility] Vitamin C 1000 MG tablet Generic drug: ascorbic acid Take 1,000 mg by mouth daily.   dextromethorphan-guaiFENesin 30-600 MG 12hr tablet Commonly known as: MUCINEX DM Take 1 tablet by mouth 2 (two) times daily for 20 days.   gabapentin 100 MG capsule Commonly known as: NEURONTIN Take 100 mg by mouth 4 (four) times daily as needed. Nerve pain   HYDROcodone-acetaminophen 10-325 MG tablet Commonly known as: NORCO Take 1 tablet by mouth 3 (three) times daily as needed for moderate pain. (back)   ipratropium-albuterol 0.5-2.5 (3) MG/3ML Soln Commonly known as: DUONEB Take 3 mLs by nebulization every 6 (six) hours as needed (SOB/wheezing not improved with the use of inhaler.).   Ipratropium-Albuterol 20-100 MCG/ACT Aers respimat Commonly known as: COMBIVENT Inhale 1 puff into the lungs every 6 (six) hours.   nicotine 21 mg/24hr patch Commonly known as: NICODERM CQ - dosed in mg/24 hours Place 1 patch (21 mg total) onto the  skin daily. Start taking on: February 24, 2020   pantoprazole 40 MG tablet Commonly known as: Protonix Take 1 tablet (40 mg total) by mouth daily.   predniSONE 20 MG tablet Commonly known as: Deltasone Take 3 tablets daily x1 day; then 2 tablets by mouth daily x2 days; then 1 tablet by mouth daily x3 days; then half tablet by mouth daily x3 days and stop prednisone.   Vitamin D (Ergocalciferol) 1.25 MG (50000 UNIT) Caps capsule Commonly known as: DRISDOL Take 50,000 Units by mouth every Monday.            Durable Medical Equipment  (From admission, onward)         Start     Ordered   02/23/20 1603  For home use only DME oxygen  Once       Comments: To be use mainly on exertion.  Question Answer Comment  Length of Need 6 Months   Mode or (Route) Nasal cannula   Liters per Minute 2   Frequency Continuous (stationary and portable oxygen unit needed)   Oxygen conserving device Yes   Oxygen delivery system Gas      02/23/20 1602         Allergies  Allergen Reactions  . Aspirin Other (See Comments)    Cannot take due to stomach ulcer  . Mushroom Extract Complex Other (See Comments)    Dizziness     The results of significant diagnostics from this hospitalization (including imaging, microbiology, ancillary and laboratory) are listed  below for reference.    Significant Diagnostic Studies: DG Chest Portable 1 View  Result Date: 02/20/2020 CLINICAL DATA:  Shortness of breath and cough.  Fever.  Body aches. EXAM: PORTABLE CHEST 1 VIEW COMPARISON:  July 04, 2013 FINDINGS: There is no focal infiltrate. No large pleural effusion. The heart size is unremarkable. There is no acute osseous abnormality. IMPRESSION: No active disease. Electronically Signed   By: Katherine Mantle M.D.   On: 02/20/2020 19:44    Microbiology: Recent Results (from the past 240 hour(s))  SARS Coronavirus 2 by RT PCR (hospital order, performed in [facility] hospital lab) Nasopharyngeal  Nasopharyngeal Swab     Status: None   Collection Time: 02/20/20  9:00 PM   Specimen: Nasopharyngeal Swab  Result Value Ref Range Status   SARS Coronavirus 2 NEGATIVE NEGATIVE Final    Comment: (NOTE) SARS-CoV-2 target nucleic acids are NOT DETECTED.  The SARS-CoV-2 RNA is generally detectable in upper and lower respiratory specimens during the acute phase of infection. The lowest concentration of SARS-CoV-2 viral copies this assay can detect is 250 copies / mL. A negative result does not preclude SARS-CoV-2 infection and should not be used as the sole basis for treatment or other patient management decisions.  A negative result may occur with improper specimen collection / handling, submission of specimen other than nasopharyngeal swab, presence of viral mutation(s) within the areas targeted by this assay, and inadequate number of viral copies (<250 copies / mL). A negative result must be combined with clinical observations, patient history, and epidemiological information.  Fact Sheet for Patients:   BoilerBrush.com.cy  Fact Sheet for Healthcare Providers: https://pope.com/  This test is not yet approved or  cleared by the [place] FDA and has been authorized for detection and/or diagnosis of SARS-CoV-2 by FDA under an Emergency Use Authorization (EUA).  This EUA will remain in effect (meaning this test can be used) for the duration of the COVID-19 declaration under Section 564(b)(1) of the Act, 21 U.S.C. section 360bbb-3(b)(1), unless the authorization is terminated or revoked sooner.  Performed at [facility], [address] [place], [place] [address]      Labs: Basic Metabolic Panel: Recent Labs  Lab 02/20/20 2206 02/23/20 0605  NA 134* 140  K 3.9 4.3  CL 97* 103  CO2 28 27  GLUCOSE 95 129*  BUN 8 20  CREATININE 0.78 0.76  CALCIUM 8.8* 8.9   CBC: Recent Labs  Lab 02/20/20 2206 02/23/20 0605  WBC 10.2  19.6*  HGB 12.8 13.3  HCT 38.8 42.2  MCV 89.6 92.1  PLT 198 259    Signed:  Vassie Loll MD.  [facility] 02/23/2020, 4:32 PM

## 2020-02-23 NOTE — TOC Transition Note (Signed)
Transition of Care [facility]) - CM/SW Discharge Note   Patient Details  Name: KASSADI PRESSWOOD MRN: [id number] Date of Birth: 19-Jul-1960  [facility]) CM/SW Contact:  Villa Herb, LCSWA Phone Number: 02/23/2020, 4:30 PM   Clinical Narrative:    CSW made referral to [facility] with [employer] for home oxygen. [facility] accepted the referral, oxygen will be delivered to the room. Hospitalist and RN updated. TOC signing off.    Final next level of care: Home/Self Care Barriers to Discharge: Barriers Resolved   Patient Goals and CMS Choice Patient states their goals for this hospitalization and ongoing recovery are:: Return home with O2.      Discharge Placement                       Discharge Plan and Services                DME Arranged: Oxygen DME Agency: [employer] Date DME Agency Contacted: 02/23/20 Time DME Agency Contacted: [phone] Representative spoke with at DME Agency: [facility] HH Arranged: NA HH Agency: NA        Social Determinants of Health (SDOH) Interventions     Readmission Risk Interventions No flowsheet data found.

## 2020-02-23 NOTE — Progress Notes (Signed)
SATURATION QUALIFICATIONS: (This note is used to comply with regulatory documentation for home oxygen)  Patient Saturations on Room Air at Rest = 88%  Patient Saturations on Room Air while Ambulating = 87%  Patient Saturations on 2 Liters of oxygen while Ambulating = 93%  Please briefly explain why patient needs home oxygen: 

## 2020-02-23 NOTE — Progress Notes (Signed)
Discharged instructions reviewed with patient and daughter.  Both verbalized understanding of instructions, patient discharged home with daughter in stable condition.

## 2020-07-12 ENCOUNTER — Other Ambulatory Visit: Payer: Self-pay

## 2020-07-12 ENCOUNTER — Emergency Department (HOSPITAL_COMMUNITY)
Admission: EM | Admit: 2020-07-12 | Discharge: 2020-07-13 | Disposition: A | Discharge status: Home / Self Care | Payer: Medicaid Other | Attending: Emergency Medicine | Admitting: Emergency Medicine

## 2020-07-12 ENCOUNTER — Emergency Department (HOSPITAL_COMMUNITY): Payer: Medicaid Other

## 2020-07-12 ENCOUNTER — Encounter (HOSPITAL_COMMUNITY): Payer: Self-pay

## 2020-07-12 DIAGNOSIS — F17210 Nicotine dependence, cigarettes, uncomplicated: Secondary | ICD-10-CM | POA: Diagnosis not present

## 2020-07-12 DIAGNOSIS — J441 Chronic obstructive pulmonary disease with (acute) exacerbation: Secondary | ICD-10-CM | POA: Diagnosis not present

## 2020-07-12 DIAGNOSIS — R000 Tachycardia, unspecified: Secondary | ICD-10-CM | POA: Diagnosis not present

## 2020-07-12 DIAGNOSIS — R1110 Vomiting, unspecified: Secondary | ICD-10-CM | POA: Diagnosis not present

## 2020-07-12 DIAGNOSIS — U071 COVID-19: Secondary | ICD-10-CM | POA: Diagnosis not present

## 2020-07-12 DIAGNOSIS — R0682 Tachypnea, not elsewhere classified: Secondary | ICD-10-CM | POA: Diagnosis not present

## 2020-07-12 DIAGNOSIS — Z7951 Long term (current) use of inhaled steroids: Secondary | ICD-10-CM | POA: Insufficient documentation

## 2020-07-12 DIAGNOSIS — R059 Cough, unspecified: Secondary | ICD-10-CM | POA: Diagnosis present

## 2020-07-12 LAB — CBC WITH DIFFERENTIAL/PLATELET
Abs Immature Granulocytes: 0 10*3/uL (ref 0.00–0.07)
Basophils Absolute: 0 10*3/uL (ref 0.0–0.1)
Basophils Relative: 0 %
Eosinophils Absolute: 0 10*3/uL (ref 0.0–0.5)
Eosinophils Relative: 0 %
HCT: 48.8 % — ABNORMAL HIGH (ref 36.0–46.0)
Hemoglobin: 15.9 g/dL — ABNORMAL HIGH (ref 12.0–15.0)
Lymphocytes Relative: 13 %
Lymphs Abs: 1.2 10*3/uL (ref 0.7–4.0)
MCH: 28.5 pg (ref 26.0–34.0)
MCHC: 32.6 g/dL (ref 30.0–36.0)
MCV: 87.5 fL (ref 80.0–100.0)
Monocytes Absolute: 0.7 10*3/uL (ref 0.1–1.0)
Monocytes Relative: 7 %
Neutro Abs: 7.7 10*3/uL (ref 1.7–7.7)
Neutrophils Relative %: 80 %
Platelets: 167 10*3/uL (ref 150–400)
RBC: 5.58 MIL/uL — ABNORMAL HIGH (ref 3.87–5.11)
RDW: 13.3 % (ref 11.5–15.5)
WBC: 9.6 10*3/uL (ref 4.0–10.5)
nRBC: 0 % (ref 0.0–0.2)
nRBC: 0 /100 WBC

## 2020-07-12 LAB — COMPREHENSIVE METABOLIC PANEL
ALT: 23 U/L (ref 0–44)
AST: 30 U/L (ref 15–41)
Albumin: 3.6 g/dL (ref 3.5–5.0)
Alkaline Phosphatase: 51 U/L (ref 38–126)
Anion gap: 14 (ref 5–15)
BUN: 15 mg/dL (ref 6–20)
CO2: 24 mmol/L (ref 22–32)
Calcium: 8.2 mg/dL — ABNORMAL LOW (ref 8.9–10.3)
Chloride: 96 mmol/L — ABNORMAL LOW (ref 98–111)
Creatinine, Ser: 0.94 mg/dL (ref 0.44–1.00)
GFR, Estimated: >60 mL/min (ref >60)
Glucose, Bld: 104 mg/dL — ABNORMAL HIGH (ref 70–99)
Potassium: 3.5 mmol/L (ref 3.5–5.1)
Sodium: 134 mmol/L — ABNORMAL LOW (ref 135–145)
Total Bilirubin: 1.2 mg/dL (ref 0.3–1.2)
Total Protein: 6.9 g/dL (ref 6.5–8.1)

## 2020-07-12 LAB — LACTIC ACID, PLASMA: Lactic Acid, Venous: 1.2 mmol/L (ref 0.5–1.9)

## 2020-07-12 LAB — SARS CORONAVIRUS 2 BY RT PCR (HOSPITAL ORDER, PERFORMED IN ~~LOC~~ HOSPITAL LAB): SARS Coronavirus 2: POSITIVE — AB

## 2020-07-12 MED ORDER — ACETAMINOPHEN 325 MG PO TABS
650.0000 mg | ORAL_TABLET | Freq: Once | ORAL | Status: AC | PRN
  Administered 2020-07-12: 650 mg via ORAL
  Filled 2020-07-12: qty 2

## 2020-07-12 MED ORDER — ONDANSETRON HCL 4 MG/2ML IJ SOLN
4.0000 mg | Freq: Once | INTRAMUSCULAR | Status: AC
  Administered 2020-07-12: 4 mg via INTRAVENOUS
  Filled 2020-07-12: qty 2

## 2020-07-12 MED ORDER — SODIUM CHLORIDE 0.9 % IV SOLN: 100.0000 mg | Freq: Every day | INTRAVENOUS | Status: DC

## 2020-07-12 MED ORDER — SODIUM CHLORIDE 0.9 % IV BOLUS
1000.0000 mL | Freq: Once | INTRAVENOUS | Status: AC
  Administered 2020-07-12: 1000 mL via INTRAVENOUS

## 2020-07-12 MED ORDER — SODIUM CHLORIDE 0.9 % IV SOLN
200.0000 mg | Freq: Once | INTRAVENOUS | Status: AC
  Administered 2020-07-13: 200 mg via INTRAVENOUS
  Filled 2020-07-12: qty 200

## 2020-07-12 MED ORDER — ALBUTEROL SULFATE HFA 108 (90 BASE) MCG/ACT IN AERS
8.0000 | INHALATION_SPRAY | Freq: Once | RESPIRATORY_TRACT | Status: AC
  Administered 2020-07-12: 8 via RESPIRATORY_TRACT
  Filled 2020-07-12: qty 6.7

## 2020-07-12 MED ORDER — DEXAMETHASONE SODIUM PHOSPHATE 10 MG/ML IJ SOLN
6.0000 mg | Freq: Once | INTRAMUSCULAR | Status: AC
  Administered 2020-07-12: 6 mg via INTRAVENOUS
  Filled 2020-07-12: qty 1

## 2020-07-12 NOTE — ED Provider Notes (Signed)
[facility] EMERGENCY DEPARTMENT Provider Note   CSN: [id number] Arrival date & time: 07/12/20  1703     History No chief complaint on file.   Erin Hobbs is a 60 y.o. female with a history of tobacco use disorder, COPD, chronic respiratory failure on 2 L home O2 who presents to the emergency department from her PCPs office by EMS with a chief complaint of URI symptoms.  The patient endorses cough, worse than baseline shortness of breath, fever, chills, malaise, myalgias, generalized weakness, and fatigue for the last 6 days.  She reports that she developed countless episodes of nonbloody, nonbilious vomiting and diarrhea yesterday.  She is only voided once in the last 24 hours.  She reports that she wears home oxygen most of the time, but will sometimes take it off when she ambulates to the restroom.  She ambulated to the restroom earlier today and her oxygen decreased down into the 80s, but improved when she resumed her home 2 L of oxygen.  She has not had to increase her home oxygen.  She was seen by her PCP today for her symptoms who advised her to go to the emergency department for further work-up and evaluation.  She denies chest pain, dysuria, back pain, flank pain, leg swelling, dizziness, lightheadedness, neck pain or stiffness, or other medical complaints at this time.  She has not been taking any home medications for her symptoms, including an albuterol inhaler.  The history is provided by the patient. No language interpreter was used.       Past Medical History:  Diagnosis Date  . Arthritis   . COPD (chronic obstructive pulmonary disease) (HCC)   . Ulcer     Patient Active Problem List   Diagnosis Date Noted  . Gastroesophageal reflux disease   . Bronchiectasis with acute exacerbation (HCC)   . COPD with acute exacerbation (HCC) 02/21/2020  . Acute respiratory failure with hypoxia (HCC) 02/21/2020  . Fever 02/21/2020  . Tobacco abuse 02/21/2020   . SIRS (systemic inflammatory response syndrome) (HCC) 02/21/2020    Past Surgical History:  Procedure Laterality Date  . TUBAL LIGATION       OB History   No obstetric history on file.     No family history on file.  Social History   Tobacco Use  . Smoking status: Current Every Day Smoker    Packs/day: 0.50    Types: Cigarettes  . Smokeless tobacco: Never Used  Vaping Use  . Vaping Use: Never used  Substance Use Topics  . Alcohol use: No  . Drug use: No    Home Medications Prior to Admission medications   Medication Sig Start Date End Date Taking? Authorizing Provider  benzonatate (TESSALON) 100 MG capsule Take 1 capsule (100 mg total) by mouth every 8 (eight) hours. 07/13/20  Yes Zedekiah Hinderman A, PA-C  dexamethasone (DECADRON) 6 MG tablet Take 1 tablet (6 mg total) by mouth daily for 9 days. 07/13/20 07/22/20 Yes Domnique Vantine A, PA-C  ondansetron (ZOFRAN ODT) 4 MG disintegrating tablet Take 1 tablet (4 mg total) by mouth every 8 (eight) hours as needed. 07/13/20  Yes Charrisse Masley A, PA-C  amphetamine-dextroamphetamine (ADDERALL) 10 MG tablet Take 10 mg by mouth every other day. 02/07/20   [provider]  azithromycin (ZITHROMAX) 250 MG tablet Take 1 tablet (250 mg total) by mouth daily. 02/23/20   Vassie Loll, MD  budesonide-formoterol [facility]) 160-4.5 MCG/ACT inhaler Inhale 2 puffs into the lungs 2 (  two) times daily.    [provider]  [employer] VITAMIN C 1000 MG tablet Take 1,000 mg by mouth daily. 10/01/19   [provider]  gabapentin (NEURONTIN) 100 MG capsule Take 100 mg by mouth 4 (four) times daily as needed. Nerve pain 01/18/20   [provider]  HYDROcodone-acetaminophen (NORCO) 10-325 MG tablet Take 1 tablet by mouth 3 (three) times daily as needed for moderate pain. (back) 02/10/20   [provider]  Ipratropium-Albuterol (COMBIVENT) 20-100 MCG/ACT AERS respimat Inhale 1 puff into the lungs every 6 (six) hours. 02/23/20    Vassie Loll, MD  ipratropium-albuterol (DUONEB) 0.5-2.5 (3) MG/3ML SOLN Take 3 mLs by nebulization every 6 (six) hours as needed (SOB/wheezing not improved with the use of inhaler.). 02/23/20   Vassie Loll, MD  nicotine (NICODERM CQ - DOSED IN MG/24 HOURS) 21 mg/24hr patch Place 1 patch (21 mg total) onto the skin daily. 02/24/20   Vassie Loll, MD  pantoprazole (PROTONIX) 40 MG tablet Take 1 tablet (40 mg total) by mouth daily. 02/23/20 02/22/21  Vassie Loll, MD  predniSONE (DELTASONE) 20 MG tablet Take 3 tablets daily x1 day; then 2 tablets by mouth daily x2 days; then 1 tablet by mouth daily x3 days; then half tablet by mouth daily x3 days and stop prednisone. 02/23/20   Vassie Loll, MD  Vitamin D, Ergocalciferol, (DRISDOL) 1.25 MG (50000 UNIT) CAPS capsule Take 50,000 Units by mouth every Monday. 02/07/20   [provider]    Allergies    Aspirin and Mushroom extract complex  Review of Systems   Review of Systems  Constitutional: Positive for chills, fatigue and fever. Negative for activity change.  HENT: Negative for sore throat.   Eyes: Negative for visual disturbance.  Respiratory: Positive for cough and shortness of breath.   Cardiovascular: Negative for chest pain, palpitations and leg swelling.  Gastrointestinal: Positive for diarrhea, nausea and vomiting. Negative for abdominal pain, anal bleeding and blood in stool.  Genitourinary: Positive for decreased urine volume. Negative for dysuria.  Musculoskeletal: Positive for myalgias. Negative for back pain.  Skin: Negative for rash.  Allergic/Immunologic: Negative for immunocompromised state.  Neurological: Positive for weakness (generalized) and headaches. Negative for dizziness, seizures, syncope, speech difficulty, light-headedness and numbness.  Psychiatric/Behavioral: Negative for confusion.    Physical Exam Updated Vital Signs BP 131/78   Pulse 95   Temp 98.5 F (36.9 C) (Oral)   Resp 19   Ht 5\' 4"   (1.626 m)   Wt 76.2 kg   SpO2 100%   BMI 28.84 kg/m   Physical Exam Vitals and nursing note reviewed.  Constitutional:      General: She is not in acute distress.    Appearance: She is not ill-appearing, toxic-appearing or diaphoretic.     Comments: Nasal cannula in place.  Nontoxic-appearing.  HENT:     Head: Normocephalic.     Mouth/Throat:     Mouth: Mucous membranes are moist.  Eyes:     Extraocular Movements: Extraocular movements intact.     Conjunctiva/sclera: Conjunctivae normal.     Pupils: Pupils are equal, round, and reactive to light.  Cardiovascular:     Rate and Rhythm: Regular rhythm. Tachycardia present.     Heart sounds: No murmur heard. No friction rub. No gallop.   Pulmonary:     Effort: Pulmonary effort is normal. No respiratory distress.     Breath sounds: Wheezing present.     Comments: Lung sounds are minimally diminished.  Inspiratory and  expiratory wheezes in the bilateral apices and midlung.  No rhonchi or rales. Abdominal:     General: There is no distension.     Palpations: Abdomen is soft. There is no mass.     Tenderness: There is no abdominal tenderness. There is no right CVA tenderness, left CVA tenderness, guarding or rebound.     Hernia: No hernia is present.     Comments: Abdomen is soft, nontender, nondistended.  Musculoskeletal:     Cervical back: Neck supple.     Right lower leg: No edema.     Left lower leg: No edema.  Skin:    General: Skin is warm.     Capillary Refill: Capillary refill takes 2 to 3 seconds.     Findings: No rash.  Neurological:     Mental Status: She is alert.  Psychiatric:        Behavior: Behavior normal.     ED Results / Procedures / Treatments   Labs (all labs ordered are listed, but only abnormal results are displayed) Labs Reviewed  SARS CORONAVIRUS 2 BY RT PCR (HOSPITAL ORDER, PERFORMED IN [facility]) - Abnormal; Notable for the following components:      Result Value   SARS  Coronavirus 2 POSITIVE (*)    All other components within normal limits  COMPREHENSIVE METABOLIC PANEL - Abnormal; Notable for the following components:   Sodium 134 (*)    Chloride 96 (*)    Glucose, Bld 104 (*)    Calcium 8.2 (*)    All other components within normal limits  CBC WITH DIFFERENTIAL/PLATELET - Abnormal; Notable for the following components:   RBC 5.58 (*)    Hemoglobin 15.9 (*)    HCT 48.8 (*)    All other components within normal limits  LACTIC ACID, PLASMA  URINALYSIS, ROUTINE W REFLEX MICROSCOPIC    EKG None  Radiology DG Chest 1 View  Result Date: 07/12/2020 CLINICAL DATA:  Possible COVID EXAM: CHEST  1 VIEW COMPARISON:  February 20, 2020 FINDINGS: The heart size and mediastinal contours are within normal limits. Unchanged minimally increased interstitial markings seen at both lung bases which could be due to atelectasis or scarring. No large airspace consolidation or pleural effusion. The visualized skeletal structures are unremarkable. IMPRESSION: No active disease. Electronically Signed   By: Jonna Clark M.D.   On: 07/12/2020 18:05    Procedures Procedures   Medications Ordered in ED Medications  remdesivir 200 mg in sodium chloride 0.9% 250 mL IVPB (0 mg Intravenous Stopped 07/13/20 0127)    Followed by  remdesivir 100 mg in sodium chloride 0.9 % 100 mL IVPB (has no administration in time range)  acetaminophen (TYLENOL) tablet 650 mg (650 mg Oral Given 07/12/20 1733)  albuterol (VENTOLIN HFA) 108 (90 Base) MCG/ACT inhaler 8 puff (8 puffs Inhalation Given 07/12/20 2343)  ondansetron (ZOFRAN) injection 4 mg (4 mg Intravenous Given 07/12/20 2343)  sodium chloride 0.9 % bolus 1,000 mL (0 mLs Intravenous Stopped 07/13/20 0127)  dexamethasone (DECADRON) injection 6 mg (6 mg Intravenous Given 07/12/20 2343)    ED Course  I have reviewed the triage vital signs and the nursing notes.  Pertinent labs & imaging results that were available during my care of the patient  were reviewed by me and considered in my medical decision making (see chart for details).    MDM Rules/Calculators/A&P  60 year old female with a history of tobacco use disorder, COPD, chronic respiratory failure on 2 L home O2 who presents to the emergency department with URI symptoms for the last 6 days.  Over the last 2 days, patient developed vomiting and diarrhea.  She is only voided once in the last 24 hours.  Primary care noted that the patient had oxygen saturation in the mid 80s, but this was documented while she was not wearing her home O2.  She has not had to increase her home O2.  Patient was febrile, tachycardic, mildly tachypneic on arrival.  Normotensive.  Labs have been reviewed and independently interpreted by me.  COVID-19 test is negative.  CBC appears hemoconcentrated, but she has no leukocytosis.  No metabolic derangements.  Chest x-ray is unremarkable.  On exam, she did have inspiratory and expiratory wheezes in the bilateral apices and midlung fields.  Will give IV fluids, Zofran, albuterol.  Since she does have home oxygen, if patient can be successfully fluid challenge, she can probably be discharged home with continued albuterol inhaler and nebulizers, corticosteroids, and referral to the COVID-19 clinic as she may benefit from remdesivir.  First dose of remdesivir given in the ER.  We will also start the patient on her first dose of dexamethasone for a 10-day course, per COVID-19 guidelines.   On reevaluation, patient ambulated to the restroom and was in no acute distress.  She has voided.  Wheezing has improved.  She is successfully fluid challenge.  She reports that she is feeling much improved.  Referral to the COVID-19 clinic has been placed.  I also placed a call to the hospital at home service, which is pending.  I have advised the patient that she can also follow-up with primary care since that she has COVID-19 with a COPD exacerbation.   She thinks chills..  Doubt PE, acute congestive heart failure, bacterial pneumonia, intra-abdominal infection.  She is hemodynamically stable no acute distress.  Safe for discharge home with outpatient follow-up as indicated.  Final Clinical Impression(s) / ED Diagnoses Final diagnoses:  COVID-19  COPD exacerbation (HCC)    Rx / DC Orders ED Discharge Orders         Ordered    ondansetron (ZOFRAN ODT) 4 MG disintegrating tablet  Every 8 hours PRN        07/13/20 0246    dexamethasone (DECADRON) 6 MG tablet  Daily        07/13/20 0246    benzonatate (TESSALON) 100 MG capsule  Every 8 hours        07/13/20 0246    Ambulatory referral for Covid Treatment        07/13/20 0247           Barkley Boards, PA-C 07/13/20 0750    Nira Conn, MD 07/14/20 [phone]

## 2020-07-12 NOTE — ED Triage Notes (Signed)
Patient arrived by [facility] from [facility] medical clinic for possible COPD exacerbation. Patient has had cough, body aches, fever and chills x 2 days. Patient is not vaccinated. Denies CP. On oxygen all the time at 2l. Alert and oriented, NAD

## 2020-07-13 ENCOUNTER — Telehealth: Payer: Self-pay | Admitting: Infectious Diseases

## 2020-07-13 MED ORDER — DEXAMETHASONE 6 MG PO TABS: 6.0000 mg | ORAL_TABLET | Freq: Every day | ORAL | 0 refills | Status: AC

## 2020-07-13 MED ORDER — BENZONATATE 100 MG PO CAPS: 100.0000 mg | ORAL_CAPSULE | Freq: Three times a day (TID) | ORAL | 0 refills | Status: DC

## 2020-07-13 MED ORDER — ONDANSETRON 4 MG PO TBDP: 4.0000 mg | ORAL_TABLET | Freq: Three times a day (TID) | ORAL | 0 refills | Status: DC | PRN

## 2020-07-13 NOTE — Telephone Encounter (Signed)
Patient reviewed - she received remdesivir in the ER x 1 dose. Will send to scheduling team to help with ongoing treatments.

## 2020-07-13 NOTE — Discharge Instructions (Signed)
Thank you for allowing me to care for you today in the Emergency Department.   You tested positive for COVID-19 today.  You no longer need to quarantine, but you do need to wear a mask when you are around others as you may still be contagious for at least 4 more days.   Use 2 puffs of the albuterol inhaler or 1 albuterol nebulizer treatment once every 4 hours for shortness of breath or wheezing.  Please note, using the nebulizer can increase the risk of spreading COVID-19 to other members of your household so please do not use this when you are around other household members.  You can take 650 mg of Tylenol once every 6 hours for body aches, headache, or fever.  Let 1 tablet of Zofran dissolve under the tongue every 8 hours as needed for nausea or vomiting.  Starting tomorrow, take 1 tablet of dexamethasone, a steroid, every day for a total of 10 days.  You were given a dose tonight in the emergency department.  Imodium is available over-the-counter and can be used as directed on the label for diarrhea.  I have reached out to our hospital at home team who may be given you a call to see if you qualify to have an in-home visit for your symptoms until they improve.  I have also placed a call to our COVID-19 team as you may qualify for 2 more additional doses of remdesivir, one of the IV treatments that you were given tonight in the emergency department.  I would recommend considering getting your COVID-19 vaccine after your symptoms resolve.  Return to the emergency department if you pass out, if your fingers or lips turn blue, if you start having chest pain, uncontrollable vomiting despite taking Zofran, if you stop producing urine, or have other new, concerning symptoms.

## 2020-07-13 NOTE — ED Notes (Signed)
Pt HR went to 113, pt O2 dropped from 95%-92%, pt became very SOB with coughing, RR were 20.

## 2020-07-14 ENCOUNTER — Ambulatory Visit (HOSPITAL_COMMUNITY)
Admission: RE | Admit: 2020-07-14 | Discharge: 2020-07-14 | Disposition: A | Discharge status: Home / Self Care | Payer: Medicaid Other | Source: Ambulatory Visit | Attending: Pulmonary Disease | Admitting: Pulmonary Disease

## 2020-07-14 ENCOUNTER — Other Ambulatory Visit: Payer: Self-pay | Admitting: Infectious Diseases

## 2020-07-14 ENCOUNTER — Other Ambulatory Visit (HOSPITAL_COMMUNITY): Payer: Self-pay

## 2020-07-14 DIAGNOSIS — U071 COVID-19: Secondary | ICD-10-CM

## 2020-07-14 DIAGNOSIS — J1289 Other viral pneumonia: Secondary | ICD-10-CM | POA: Diagnosis not present

## 2020-07-14 MED ORDER — METHYLPREDNISOLONE SODIUM SUCC 125 MG IJ SOLR: 125.0000 mg | Freq: Once | INTRAMUSCULAR | Status: DC | PRN

## 2020-07-14 MED ORDER — SODIUM CHLORIDE 0.9 % IV SOLN: INTRAVENOUS | Status: DC | PRN

## 2020-07-14 MED ORDER — FAMOTIDINE IN NACL 20-0.9 MG/50ML-% IV SOLN: 20.0000 mg | Freq: Once | INTRAVENOUS | Status: DC | PRN

## 2020-07-14 MED ORDER — EPINEPHRINE 0.3 MG/0.3ML IJ SOAJ: 0.3000 mg | Freq: Once | INTRAMUSCULAR | Status: DC | PRN

## 2020-07-14 MED ORDER — SODIUM CHLORIDE 0.9 % IV SOLN
100.0000 mg | Freq: Once | INTRAVENOUS | Status: AC
  Administered 2020-07-14: 100 mg via INTRAVENOUS

## 2020-07-14 MED ORDER — DIPHENHYDRAMINE HCL 50 MG/ML IJ SOLN: 50.0000 mg | Freq: Once | INTRAMUSCULAR | Status: DC | PRN

## 2020-07-14 MED ORDER — ALBUTEROL SULFATE HFA 108 (90 BASE) MCG/ACT IN AERS: 2.0000 | INHALATION_SPRAY | Freq: Once | RESPIRATORY_TRACT | Status: DC | PRN

## 2020-07-14 NOTE — Discharge Instructions (Signed)
10 Things You Can Do to Manage Your COVID-19 Symptoms at Home °If you have possible or confirmed COVID-19: °1. Stay home except to get medical care. °2. Monitor your symptoms carefully. If your symptoms get worse, call your healthcare provider immediately. °3. Get rest and stay hydrated. °4. If you have a medical appointment, call the healthcare provider ahead of time and tell them that you have or may have COVID-19. °5. For medical emergencies, call 911 and notify the dispatch personnel that you have or may have COVID-19. °6. Cover your cough and sneezes with a tissue or use the inside of your elbow. °7. Wash your hands often with soap and water for at least 20 seconds or clean your hands with an alcohol-based hand sanitizer that contains at least 60% alcohol. °8. As much as possible, stay in a specific room and away from other people in your home. Also, you should use a separate bathroom, if available. If you need to be around other people in or outside of the home, wear a mask. °9. Avoid sharing personal items with other people in your household, like dishes, towels, and bedding. °10. Clean all surfaces that are touched often, like counters, tabletops, and doorknobs. Use household cleaning sprays or wipes according to the label instructions. °cdc.gov/coronavirus °12/24/2019 °This information is not intended to replace advice given to you by your health care provider. Make sure you discuss any questions you have with your health care provider. °Document Revised: 04/10/2020 Document Reviewed: 04/10/2020 °Elsevier Patient Education © 2021 Elsevier Inc. °If you have any questions or concerns after the infusion please call the Advanced Practice Provider on call at [phone]. This number is ONLY intended for your use regarding questions or concerns about the infusion post-treatment side-effects.  Please do not provide this number to others for use. For return to work notes please contact your primary care provider.   ° °If someone you know is interested in receiving treatment please have them contact their MD for a referral or visit [email or web address] ° ° ° °

## 2020-07-14 NOTE — Progress Notes (Signed)
  Diagnosis: COVID-19  Physician: Patrick Wright, MD  Procedure: Covid Infusion Clinic Med: remdesivir infusion - Provided patient with remdesivir fact sheet for patients, parents and caregivers prior to infusion.  Complications: No immediate complications noted.  Discharge: Discharged home   Erin Hobbs [name] 07/14/2020  

## 2020-07-14 NOTE — Progress Notes (Signed)
Patient reviewed Fact Sheet for Patients, Parents, and Caregivers for Emergency Use Authorization (EUA) of Remdesivir for the Treatment of Coronavirus. Patient also reviewed and is agreeable to the estimated cost of treatment. Patient is agreeable to proceed.   

## 2020-07-15 ENCOUNTER — Ambulatory Visit (HOSPITAL_COMMUNITY)
Admission: RE | Admit: 2020-07-15 | Discharge: 2020-07-15 | Disposition: A | Discharge status: Home / Self Care | Payer: Medicaid Other | Source: Ambulatory Visit | Attending: Pulmonary Disease | Admitting: Pulmonary Disease

## 2020-07-15 DIAGNOSIS — U071 COVID-19: Secondary | ICD-10-CM | POA: Diagnosis present

## 2020-07-15 DIAGNOSIS — J1282 Pneumonia due to coronavirus disease 2019: Secondary | ICD-10-CM | POA: Diagnosis not present

## 2020-07-15 MED ORDER — ALBUTEROL SULFATE HFA 108 (90 BASE) MCG/ACT IN AERS: 2.0000 | INHALATION_SPRAY | Freq: Once | RESPIRATORY_TRACT | Status: DC | PRN

## 2020-07-15 MED ORDER — METHYLPREDNISOLONE SODIUM SUCC 125 MG IJ SOLR: 125.0000 mg | Freq: Once | INTRAMUSCULAR | Status: DC | PRN

## 2020-07-15 MED ORDER — SODIUM CHLORIDE 0.9 % IV SOLN: INTRAVENOUS | Status: DC | PRN

## 2020-07-15 MED ORDER — SODIUM CHLORIDE 0.9 % IV SOLN
100.0000 mg | Freq: Once | INTRAVENOUS | Status: AC
  Administered 2020-07-15: 100 mg via INTRAVENOUS

## 2020-07-15 MED ORDER — EPINEPHRINE 0.3 MG/0.3ML IJ SOAJ: 0.3000 mg | Freq: Once | INTRAMUSCULAR | Status: DC | PRN

## 2020-07-15 MED ORDER — DIPHENHYDRAMINE HCL 50 MG/ML IJ SOLN: 50.0000 mg | Freq: Once | INTRAMUSCULAR | Status: DC | PRN

## 2020-07-15 MED ORDER — FAMOTIDINE IN NACL 20-0.9 MG/50ML-% IV SOLN: 20.0000 mg | Freq: Once | INTRAVENOUS | Status: DC | PRN

## 2020-07-15 NOTE — Progress Notes (Signed)
Patient reviewed Fact Sheet for Patients, Parents, and Caregivers for Emergency Use Authorization (EUA) of remdesivir for the Treatment of Coronavirus. Patient also reviewed and is agreeable to the estimated cost of treatment. Patient is agreeable to proceed.    

## 2020-07-15 NOTE — Discharge Instructions (Signed)
10 Things You Can Do to Manage Your COVID-19 Symptoms at Home °If you have possible or confirmed COVID-19: °1. Stay home except to get medical care. °2. Monitor your symptoms carefully. If your symptoms get worse, call your healthcare provider immediately. °3. Get rest and stay hydrated. °4. If you have a medical appointment, call the healthcare provider ahead of time and tell them that you have or may have COVID-19. °5. For medical emergencies, call 911 and notify the dispatch personnel that you have or may have COVID-19. °6. Cover your cough and sneezes with a tissue or use the inside of your elbow. °7. Wash your hands often with soap and water for at least 20 seconds or clean your hands with an alcohol-based hand sanitizer that contains at least 60% alcohol. °8. As much as possible, stay in a specific room and away from other people in your home. Also, you should use a separate bathroom, if available. If you need to be around other people in or outside of the home, wear a mask. °9. Avoid sharing personal items with other people in your household, like dishes, towels, and bedding. °10. Clean all surfaces that are touched often, like counters, tabletops, and doorknobs. Use household cleaning sprays or wipes according to the label instructions. °cdc.gov/coronavirus °12/24/2019 °This information is not intended to replace advice given to you by your health care provider. Make sure you discuss any questions you have with your health care provider. °Document Revised: 04/10/2020 Document Reviewed: 04/10/2020 °Elsevier Patient Education © 2021 Elsevier Inc. °If you have any questions or concerns after the infusion please call the Advanced Practice Provider on call at [phone]. This number is ONLY intended for your use regarding questions or concerns about the infusion post-treatment side-effects.  Please do not provide this number to others for use. For return to work notes please contact your primary care provider.   ° °If someone you know is interested in receiving treatment please have them contact their MD for a referral or visit [email or web address] ° ° ° °

## 2020-09-06 ENCOUNTER — Emergency Department (HOSPITAL_COMMUNITY)
Admission: EM | Admit: 2020-09-06 | Discharge: 2020-09-06 | Disposition: A | Discharge status: Home / Self Care | Payer: Medicaid Other | Attending: Emergency Medicine | Admitting: Emergency Medicine

## 2020-09-06 ENCOUNTER — Emergency Department (HOSPITAL_COMMUNITY): Payer: Medicaid Other

## 2020-09-06 ENCOUNTER — Other Ambulatory Visit: Payer: Self-pay

## 2020-09-06 DIAGNOSIS — R519 Headache, unspecified: Secondary | ICD-10-CM | POA: Diagnosis not present

## 2020-09-06 DIAGNOSIS — Z7951 Long term (current) use of inhaled steroids: Secondary | ICD-10-CM | POA: Diagnosis not present

## 2020-09-06 DIAGNOSIS — Z20822 Contact with and (suspected) exposure to covid-19: Secondary | ICD-10-CM | POA: Insufficient documentation

## 2020-09-06 DIAGNOSIS — R430 Anosmia: Secondary | ICD-10-CM | POA: Insufficient documentation

## 2020-09-06 DIAGNOSIS — F17210 Nicotine dependence, cigarettes, uncomplicated: Secondary | ICD-10-CM | POA: Diagnosis not present

## 2020-09-06 DIAGNOSIS — R059 Cough, unspecified: Secondary | ICD-10-CM | POA: Insufficient documentation

## 2020-09-06 DIAGNOSIS — R0981 Nasal congestion: Secondary | ICD-10-CM | POA: Diagnosis not present

## 2020-09-06 DIAGNOSIS — R062 Wheezing: Secondary | ICD-10-CM | POA: Diagnosis not present

## 2020-09-06 DIAGNOSIS — R509 Fever, unspecified: Secondary | ICD-10-CM | POA: Insufficient documentation

## 2020-09-06 DIAGNOSIS — M791 Myalgia, unspecified site: Secondary | ICD-10-CM | POA: Insufficient documentation

## 2020-09-06 DIAGNOSIS — Z8616 Personal history of COVID-19: Secondary | ICD-10-CM | POA: Diagnosis not present

## 2020-09-06 DIAGNOSIS — Z1152 Encounter for screening for COVID-19: Secondary | ICD-10-CM | POA: Diagnosis not present

## 2020-09-06 DIAGNOSIS — J441 Chronic obstructive pulmonary disease with (acute) exacerbation: Secondary | ICD-10-CM | POA: Insufficient documentation

## 2020-09-06 MED ORDER — ALBUTEROL SULFATE HFA 108 (90 BASE) MCG/ACT IN AERS
2.0000 | INHALATION_SPRAY | Freq: Once | RESPIRATORY_TRACT | Status: AC
  Administered 2020-09-06: 2 via RESPIRATORY_TRACT
  Filled 2020-09-06: qty 6.7

## 2020-09-06 MED ORDER — ACETAMINOPHEN 500 MG PO TABS
1000.0000 mg | ORAL_TABLET | Freq: Once | ORAL | Status: AC
  Administered 2020-09-06: 1000 mg via ORAL
  Filled 2020-09-06: qty 2

## 2020-09-06 MED ORDER — BENZONATATE 200 MG PO CAPS: 200.0000 mg | ORAL_CAPSULE | Freq: Three times a day (TID) | ORAL | 0 refills | Status: DC | PRN

## 2020-09-06 NOTE — ED Provider Notes (Signed)
MSE was initiated and I personally evaluated the patient and placed orders (if any) at  5:31 PM on September 06, 2020.  Patient presents to the ED with complaints of cough nasal congestion headache chills and recently feels like she lost her taste and smell.  Patient had covid back in February.  She feels like she might have it again.  She has been coughing.  She had low-grade temperatures.  No complaints of shortness of breath.  The patient appears stable so that the remainder of the MSE may be completed by another provider.  Chest x-ray and Covid test ordered.  Vital signs are stable   Linwood Dibbles, MD 09/06/20 [phone]

## 2020-09-06 NOTE — ED Provider Notes (Signed)
[facility] EMERGENCY DEPARTMENT Provider Note   CSN: [id number] Arrival date & time: 09/06/20  2500     History Chief Complaint  Patient presents with  . Nasal Congestion    Erin Hobbs is a 60 y.o. female.  HPI      Erin Hobbs is a 60 y.o. female with past medical history of COPD who presents to the Emergency Department complaining of generalized body aches, nasal congestion, cough, loss of taste and smell, headache and chills.  Symptoms began 3 days ago.  She is here requesting COVID testing.  She had COVID in February of this year and she is concerned that she has contracted it again.  Low-grade fever, denies abdominal or chest pain.  No shortness of breath but does endorse intermittent wheezing.  She has albuterol and oxygen at home.  She is not vaccinated for COVID, but received outpatient IV infusion treatment.  Past Medical History:  Diagnosis Date  . Arthritis   . COPD (chronic obstructive pulmonary disease) (HCC)   . Ulcer     Patient Active Problem List   Diagnosis Date Noted  . Gastroesophageal reflux disease   . Bronchiectasis with acute exacerbation (HCC)   . COPD with acute exacerbation (HCC) 02/21/2020  . Acute respiratory failure with hypoxia (HCC) 02/21/2020  . Fever 02/21/2020  . Tobacco abuse 02/21/2020  . SIRS (systemic inflammatory response syndrome) (HCC) 02/21/2020    Past Surgical History:  Procedure Laterality Date  . TUBAL LIGATION       OB History   No obstetric history on file.     No family history on file.  Social History   Tobacco Use  . Smoking status: Current Every Day Smoker    Packs/day: 0.50    Types: Cigarettes  . Smokeless tobacco: Never Used  Vaping Use  . Vaping Use: Never used  Substance Use Topics  . Alcohol use: No  . Drug use: No    Home Medications Prior to Admission medications   Medication Sig Start Date End Date Taking? Authorizing Provider  amphetamine-dextroamphetamine (ADDERALL) 10 MG  tablet Take 10 mg by mouth every other day. 02/07/20   [provider]  azithromycin (ZITHROMAX) 250 MG tablet Take 1 tablet (250 mg total) by mouth daily. 02/23/20   Vassie Loll, MD  benzonatate (TESSALON) 100 MG capsule Take 1 capsule (100 mg total) by mouth every 8 (eight) hours. 07/13/20   McDonald, Mia A, PA-C  budesonide-formoterol (SYMBICORT) 160-4.5 MCG/ACT inhaler Inhale 2 puffs into the lungs 2 (two) times daily.    [provider]  [employer] VITAMIN C 1000 MG tablet Take 1,000 mg by mouth daily. 10/01/19   [provider]  gabapentin (NEURONTIN) 100 MG capsule Take 100 mg by mouth 4 (four) times daily as needed. Nerve pain 01/18/20   [provider]  HYDROcodone-acetaminophen (NORCO) 10-325 MG tablet Take 1 tablet by mouth 3 (three) times daily as needed for moderate pain. (back) 02/10/20   [provider]  Ipratropium-Albuterol (COMBIVENT) 20-100 MCG/ACT AERS respimat Inhale 1 puff into the lungs every 6 (six) hours. 02/23/20   Vassie Loll, MD  ipratropium-albuterol (DUONEB) 0.5-2.5 (3) MG/3ML SOLN Take 3 mLs by nebulization every 6 (six) hours as needed (SOB/wheezing not improved with the use of inhaler.). 02/23/20   Vassie Loll, MD  nicotine (NICODERM CQ - DOSED IN MG/24 HOURS) 21 mg/24hr patch Place 1 patch (21 mg total) onto the skin daily. 02/24/20   Vassie Loll, MD  ondansetron [facility]  ODT) 4 MG disintegrating tablet Take 1 tablet (4 mg total) by mouth every 8 (eight) hours as needed. 07/13/20   McDonald, Mia A, PA-C  pantoprazole (PROTONIX) 40 MG tablet Take 1 tablet (40 mg total) by mouth daily. 02/23/20 02/22/21  Vassie Loll, MD  predniSONE (DELTASONE) 20 MG tablet Take 3 tablets daily x1 day; then 2 tablets by mouth daily x2 days; then 1 tablet by mouth daily x3 days; then half tablet by mouth daily x3 days and stop prednisone. 02/23/20   Vassie Loll, MD  Vitamin D, Ergocalciferol, (DRISDOL) 1.25 MG (50000 UNIT) CAPS capsule Take 50,000  Units by mouth every Monday. 02/07/20   [provider]    Allergies    Aspirin and Mushroom extract complex  Review of Systems   Review of Systems  Constitutional: Positive for chills. Negative for appetite change and fever.  HENT: Positive for congestion, rhinorrhea and sinus pressure. Negative for sore throat and trouble swallowing.        Loss of taste and smell  Respiratory: Positive for cough and wheezing. Negative for chest tightness and shortness of breath.   Cardiovascular: Negative for chest pain.  Gastrointestinal: Positive for diarrhea. Negative for abdominal pain, nausea and vomiting.  Genitourinary: Negative for difficulty urinating and dysuria.  Musculoskeletal: Positive for myalgias. Negative for arthralgias.  Skin: Negative for rash.  Neurological: Positive for headaches. Negative for dizziness, syncope, weakness and numbness.  Hematological: Negative for adenopathy.    Physical Exam Updated Vital Signs BP (!) 146/86 (BP Location: Right Arm)   Pulse (!) 101   Temp 100 F (37.8 C) (Oral)   Resp 16   Ht 5\' 4"  (1.626 m)   Wt 76.2 kg   SpO2 96%   BMI 28.84 kg/m   Physical Exam Vitals and nursing note reviewed.  Constitutional:      Appearance: Normal appearance. She is not toxic-appearing.  HENT:     Head: Normocephalic.     Nose: Congestion present.     Mouth/Throat:     Mouth: Mucous membranes are moist.  Eyes:     Conjunctiva/sclera: Conjunctivae normal.  Neck:     Thyroid: No thyromegaly.     Meningeal: Kernig's sign absent.  Cardiovascular:     Rate and Rhythm: Normal rate and regular rhythm.     Pulses: Normal pulses.  Pulmonary:     Effort: Pulmonary effort is normal. No respiratory distress.     Breath sounds: Wheezing present.     Comments: Few scattered expiratory wheezes.  No rales or rhonchi Abdominal:     General: There is no distension.     Palpations: Abdomen is soft.     Tenderness: There is no abdominal tenderness. There  is no guarding or rebound.  Musculoskeletal:        General: Normal range of motion.     Cervical back: Normal range of motion.     Right lower leg: No edema.     Left lower leg: No edema.  Skin:    General: Skin is warm.     Capillary Refill: Capillary refill takes less than 2 seconds.     Findings: No rash.  Neurological:     General: No focal deficit present.     Mental Status: She is alert.     Sensory: No sensory deficit.     Motor: No weakness.     ED Results / Procedures / Treatments   Labs (all labs ordered are listed, but only abnormal results  are displayed) Labs Reviewed  SARS CORONAVIRUS 2 (TAT 6-24 HRS)    EKG None  Radiology DG Chest Port 1 View  Result Date: 09/06/2020 CLINICAL DATA:  Cough, nasal and chest congestion, loss of taste and smell EXAM: PORTABLE CHEST 1 VIEW COMPARISON:  07/12/2020 FINDINGS: Single frontal view of the chest demonstrates chronic background scarring and emphysema, without superimposed airspace disease, effusion, or pneumothorax. No acute bony abnormalities. IMPRESSION: 1. Chronic emphysema, no acute process. Electronically Signed   By: Sharlet Salina M.D.   On: 09/06/2020 17:58    Procedures Procedures   Medications Ordered in ED Medications - No data to display  ED Course  I have reviewed the triage vital signs and the nursing notes.  Pertinent labs & imaging results that were available during my care of the patient were reviewed by me and considered in my medical decision making (see chart for details).    MDM Rules/Calculators/A&P                          Patient here for evaluation of possible Covid infection.  Had COVID in February and treated with IV therapy.  Current symptoms began 3 days ago.  No known exposures.  She is well-appearing and nontoxic.  No hypoxia, mucous membranes are moist.  Abdominal exam reassuring.  Chest x-ray without acute process.  Covid test pending.  Fever addressed here with Tylenol.  I feel  that she is appropriate for discharge home.  Understands that she will be notified if results are positive.  Agrees to isolate at home.  The patient appears reasonably screened and/or stabilized for discharge and I doubt any other medical condition or other [facility] requiring further screening, evaluation, or treatment in the ED at this time prior to discharge.   Erin Hobbs was evaluated in Emergency Department on 09/06/2020 for the symptoms described in the history of present illness. She was evaluated in the context of the global COVID-19 pandemic, which necessitated consideration that the patient might be at risk for infection with the SARS-CoV-2 virus that causes COVID-19. Institutional protocols and algorithms that pertain to the evaluation of patients at risk for COVID-19 are in a state of rapid change based on information released by regulatory bodies including the CDC and federal and state organizations. These policies and algorithms were followed during the patient's care in the ED.  Final Clinical Impression(s) / ED Diagnoses Final diagnoses:  Encounter for screening for COVID-19    Rx / DC Orders ED Discharge Orders    None       Rosey Bath 09/06/20 2103    Linwood Dibbles, MD 09/07/20 [phone]

## 2020-09-06 NOTE — ED Triage Notes (Signed)
Pt to the ED with cough, nasal and chest congestion, HA, chills, loss of taste and smell.  Pt states she thinks she has covid again. Pt tested poitive for covid 3 months ago.

## 2020-09-06 NOTE — Discharge Instructions (Addendum)
Your Covid test is pending.  Your results should be back tomorrow.  You will be notified if your results are positive.  Use the albuterol inhaler 2 puffs every 4 to 6 hours as needed.  Tylenol every 4 hours if needed for fever and/or body aches.  Follow-up with your primary care provider for recheck.  Return emergency department if you develop any worsening symptoms.

## 2020-09-07 LAB — SARS CORONAVIRUS 2 (TAT 6-24 HRS): SARS Coronavirus 2: NEGATIVE

## 2022-01-04 ENCOUNTER — Other Ambulatory Visit: Payer: Self-pay | Admitting: Family Medicine

## 2022-01-04 DIAGNOSIS — Z1231 Encounter for screening mammogram for malignant neoplasm of breast: Secondary | ICD-10-CM

## 2022-03-29 ENCOUNTER — Other Ambulatory Visit (HOSPITAL_COMMUNITY): Payer: Self-pay

## 2022-04-01 ENCOUNTER — Other Ambulatory Visit (HOSPITAL_COMMUNITY): Payer: Self-pay

## 2022-04-01 MED ORDER — ALPRAZOLAM 0.5 MG PO TABS
0.5000 mg | ORAL_TABLET | Freq: Every day | ORAL | 0 refills | Status: DC
  Filled 2022-04-01: qty 30, 30d supply, fill #0

## 2022-04-01 MED ORDER — AMPHETAMINE-DEXTROAMPHETAMINE 10 MG PO TABS
10.0000 mg | ORAL_TABLET | Freq: Two times a day (BID) | ORAL | 0 refills | Status: DC
  Filled 2022-04-01: qty 60, 30d supply, fill #0

## 2022-04-01 MED ORDER — HYDROCODONE-ACETAMINOPHEN 10-325 MG PO TABS
1.0000 | ORAL_TABLET | Freq: Two times a day (BID) | ORAL | 0 refills | Status: DC | PRN
  Filled 2022-04-01: qty 60, 30d supply, fill #0

## 2022-05-13 ENCOUNTER — Other Ambulatory Visit (HOSPITAL_COMMUNITY): Payer: Self-pay

## 2022-05-13 MED ORDER — HYDROCODONE-ACETAMINOPHEN 10-325 MG PO TABS
1.0000 | ORAL_TABLET | Freq: Two times a day (BID) | ORAL | 0 refills | Status: DC | PRN
  Filled 2022-05-13: qty 60, 30d supply, fill #0

## 2022-05-13 MED ORDER — ALPRAZOLAM 0.5 MG PO TABS
0.5000 mg | ORAL_TABLET | Freq: Every day | ORAL | 0 refills | Status: DC
  Filled 2022-05-13: qty 30, 30d supply, fill #0

## 2022-05-13 MED ORDER — AMPHETAMINE-DEXTROAMPHETAMINE 10 MG PO TABS
10.0000 mg | ORAL_TABLET | Freq: Two times a day (BID) | ORAL | 0 refills | Status: DC
Start: 2022-05-13 — End: 2022-09-07
  Filled 2022-05-13: qty 60, 30d supply, fill #0

## 2022-06-21 ENCOUNTER — Other Ambulatory Visit (HOSPITAL_COMMUNITY): Payer: Self-pay

## 2022-06-21 MED ORDER — AMPHETAMINE-DEXTROAMPHETAMINE 10 MG PO TABS
10.0000 mg | ORAL_TABLET | Freq: Two times a day (BID) | ORAL | 0 refills | Status: DC
  Filled 2022-06-21: qty 60, 30d supply, fill #0

## 2022-06-21 MED ORDER — ALPRAZOLAM 0.5 MG PO TABS
0.5000 mg | ORAL_TABLET | Freq: Every day | ORAL | 0 refills | Status: DC
  Filled 2022-06-21: qty 30, 30d supply, fill #0

## 2022-06-21 MED ORDER — HYDROCODONE-ACETAMINOPHEN 10-325 MG PO TABS
1.0000 | ORAL_TABLET | Freq: Two times a day (BID) | ORAL | 0 refills | Status: DC | PRN
  Filled 2022-06-21: qty 10, 5d supply, fill #0
  Filled 2022-06-27: qty 50, 25d supply, fill #1

## 2022-06-26 ENCOUNTER — Other Ambulatory Visit (HOSPITAL_COMMUNITY): Payer: Self-pay

## 2022-06-26 MED ORDER — HYDROCODONE-ACETAMINOPHEN 10-325 MG PO TABS
1.0000 | ORAL_TABLET | Freq: Two times a day (BID) | ORAL | 0 refills | Status: DC | PRN
  Filled 2022-06-26: qty 46, 23d supply, fill #0

## 2022-06-27 ENCOUNTER — Other Ambulatory Visit (HOSPITAL_COMMUNITY): Payer: Self-pay

## 2022-07-22 ENCOUNTER — Other Ambulatory Visit (HOSPITAL_COMMUNITY): Payer: Self-pay

## 2022-07-22 MED ORDER — ALPRAZOLAM 0.5 MG PO TABS
0.5000 mg | ORAL_TABLET | Freq: Every day | ORAL | 0 refills | Status: DC
  Filled 2022-07-22: qty 30, 30d supply, fill #0

## 2022-07-22 MED ORDER — AMPHETAMINE-DEXTROAMPHETAMINE 10 MG PO TABS
10.0000 mg | ORAL_TABLET | Freq: Two times a day (BID) | ORAL | 0 refills | Status: DC
Start: 2022-07-19 — End: 2022-09-07
  Filled 2022-07-22: qty 60, 30d supply, fill #0

## 2022-07-22 MED ORDER — HYDROCODONE-ACETAMINOPHEN 10-325 MG PO TABS
1.0000 | ORAL_TABLET | Freq: Two times a day (BID) | ORAL | 0 refills | Status: DC | PRN
Start: 2022-07-19 — End: 2022-08-17
  Filled 2022-07-22: qty 60, 30d supply, fill #0

## 2022-08-16 ENCOUNTER — Other Ambulatory Visit (HOSPITAL_COMMUNITY): Payer: Self-pay

## 2022-08-17 ENCOUNTER — Other Ambulatory Visit (HOSPITAL_COMMUNITY): Payer: Self-pay

## 2022-08-17 MED ORDER — ALPRAZOLAM 0.5 MG PO TABS
0.5000 mg | ORAL_TABLET | Freq: Every day | ORAL | 0 refills | Status: DC
  Filled 2022-08-17: qty 30, 30d supply, fill #0

## 2022-08-17 MED ORDER — AMPHETAMINE-DEXTROAMPHETAMINE 10 MG PO TABS
10.0000 mg | ORAL_TABLET | Freq: Two times a day (BID) | ORAL | 0 refills | Status: DC
  Filled 2022-08-17: qty 60, 30d supply, fill #0

## 2022-08-17 MED ORDER — HYDROCODONE-ACETAMINOPHEN 10-325 MG PO TABS
1.0000 | ORAL_TABLET | Freq: Two times a day (BID) | ORAL | 0 refills | Status: DC | PRN
  Filled 2022-08-17 – 2022-08-21 (×3): qty 60, 30d supply, fill #0

## 2022-08-19 ENCOUNTER — Other Ambulatory Visit: Payer: Self-pay

## 2022-08-19 ENCOUNTER — Other Ambulatory Visit (HOSPITAL_COMMUNITY): Payer: Self-pay

## 2022-08-21 ENCOUNTER — Other Ambulatory Visit (HOSPITAL_COMMUNITY): Payer: Self-pay

## 2022-09-04 ENCOUNTER — Inpatient Hospital Stay (HOSPITAL_COMMUNITY)
Admission: EM | Admit: 2022-09-04 | Discharge: 2022-09-07 | DRG: 193 | Disposition: A | Discharge status: Home / Self Care | Payer: Medicaid Other | Attending: Internal Medicine | Admitting: Internal Medicine

## 2022-09-04 ENCOUNTER — Emergency Department (HOSPITAL_COMMUNITY): Payer: Medicaid Other

## 2022-09-04 ENCOUNTER — Encounter (HOSPITAL_COMMUNITY): Payer: Self-pay | Admitting: Emergency Medicine

## 2022-09-04 ENCOUNTER — Other Ambulatory Visit: Payer: Self-pay

## 2022-09-04 DIAGNOSIS — Z886 Allergy status to analgesic agent status: Secondary | ICD-10-CM

## 2022-09-04 DIAGNOSIS — T380X5A Adverse effect of glucocorticoids and synthetic analogues, initial encounter: Secondary | ICD-10-CM | POA: Diagnosis not present

## 2022-09-04 DIAGNOSIS — Z6830 Body mass index (BMI) 30.0-30.9, adult: Secondary | ICD-10-CM

## 2022-09-04 DIAGNOSIS — K219 Gastro-esophageal reflux disease without esophagitis: Secondary | ICD-10-CM | POA: Diagnosis present

## 2022-09-04 DIAGNOSIS — J189 Pneumonia, unspecified organism: Principal | ICD-10-CM | POA: Diagnosis present

## 2022-09-04 DIAGNOSIS — E669 Obesity, unspecified: Secondary | ICD-10-CM | POA: Diagnosis present

## 2022-09-04 DIAGNOSIS — J9621 Acute and chronic respiratory failure with hypoxia: Secondary | ICD-10-CM | POA: Diagnosis not present

## 2022-09-04 DIAGNOSIS — Z7951 Long term (current) use of inhaled steroids: Secondary | ICD-10-CM

## 2022-09-04 DIAGNOSIS — J441 Chronic obstructive pulmonary disease with (acute) exacerbation: Secondary | ICD-10-CM | POA: Diagnosis not present

## 2022-09-04 DIAGNOSIS — Z79899 Other long term (current) drug therapy: Secondary | ICD-10-CM

## 2022-09-04 DIAGNOSIS — J471 Bronchiectasis with (acute) exacerbation: Secondary | ICD-10-CM | POA: Diagnosis not present

## 2022-09-04 DIAGNOSIS — Z1152 Encounter for screening for COVID-19: Secondary | ICD-10-CM

## 2022-09-04 DIAGNOSIS — M1990 Unspecified osteoarthritis, unspecified site: Secondary | ICD-10-CM | POA: Diagnosis present

## 2022-09-04 DIAGNOSIS — J440 Chronic obstructive pulmonary disease with acute lower respiratory infection: Secondary | ICD-10-CM | POA: Diagnosis present

## 2022-09-04 DIAGNOSIS — J470 Bronchiectasis with acute lower respiratory infection: Secondary | ICD-10-CM | POA: Diagnosis present

## 2022-09-04 DIAGNOSIS — G894 Chronic pain syndrome: Secondary | ICD-10-CM | POA: Diagnosis present

## 2022-09-04 DIAGNOSIS — Z9109 Other allergy status, other than to drugs and biological substances: Secondary | ICD-10-CM

## 2022-09-04 DIAGNOSIS — F17210 Nicotine dependence, cigarettes, uncomplicated: Secondary | ICD-10-CM | POA: Diagnosis present

## 2022-09-04 LAB — CBC WITH DIFFERENTIAL/PLATELET
Abs Immature Granulocytes: 0.08 10*3/uL — ABNORMAL HIGH (ref 0.00–0.07)
Basophils Absolute: 0.1 10*3/uL (ref 0.0–0.1)
Basophils Relative: 0 %
Eosinophils Absolute: 0.1 10*3/uL (ref 0.0–0.5)
Eosinophils Relative: 0 %
HCT: 41 % (ref 36.0–46.0)
Hemoglobin: 13.3 g/dL (ref 12.0–15.0)
Immature Granulocytes: 1 %
Lymphocytes Relative: 11 %
Lymphs Abs: 1.6 10*3/uL (ref 0.7–4.0)
MCH: 28.5 pg (ref 26.0–34.0)
MCHC: 32.4 g/dL (ref 30.0–36.0)
MCV: 88 fL (ref 80.0–100.0)
Monocytes Absolute: 1.7 10*3/uL — ABNORMAL HIGH (ref 0.1–1.0)
Monocytes Relative: 11 %
Neutro Abs: 11.9 10*3/uL — ABNORMAL HIGH (ref 1.7–7.7)
Neutrophils Relative %: 77 %
Platelets: 219 10*3/uL (ref 150–400)
RBC: 4.66 MIL/uL (ref 3.87–5.11)
RDW: 13.5 % (ref 11.5–15.5)
WBC: 15.4 10*3/uL — ABNORMAL HIGH (ref 4.0–10.5)
nRBC: 0 % (ref 0.0–0.2)

## 2022-09-04 LAB — COMPREHENSIVE METABOLIC PANEL
ALT: 16 U/L (ref 0–44)
AST: 18 U/L (ref 15–41)
Albumin: 3.5 g/dL (ref 3.5–5.0)
Alkaline Phosphatase: 79 U/L (ref 38–126)
Anion gap: 9 (ref 5–15)
BUN: 9 mg/dL (ref 8–23)
CO2: 24 mmol/L (ref 22–32)
Calcium: 8.2 mg/dL — ABNORMAL LOW (ref 8.9–10.3)
Chloride: 99 mmol/L (ref 98–111)
Creatinine, Ser: 0.75 mg/dL (ref 0.44–1.00)
GFR, Estimated: >60 mL/min (ref >60)
Glucose, Bld: 167 mg/dL — ABNORMAL HIGH (ref 70–99)
Potassium: 3.6 mmol/L (ref 3.5–5.1)
Sodium: 132 mmol/L — ABNORMAL LOW (ref 135–145)
Total Bilirubin: 0.8 mg/dL (ref 0.3–1.2)
Total Protein: 7.2 g/dL (ref 6.5–8.1)

## 2022-09-04 LAB — RESP PANEL BY RT-PCR (RSV, FLU A&B, COVID)  RVPGX2
Influenza A by PCR: NEGATIVE
Influenza B by PCR: NEGATIVE
Resp Syncytial Virus by PCR: NEGATIVE
SARS Coronavirus 2 by RT PCR: NEGATIVE

## 2022-09-04 LAB — LACTIC ACID, PLASMA
Lactic Acid, Venous: 1.1 mmol/L (ref 0.5–1.9)
Lactic Acid, Venous: 0.9 mmol/L (ref 0.5–1.9)

## 2022-09-04 MED ORDER — SODIUM CHLORIDE 0.9 % IV SOLN
1.0000 g | Freq: Once | INTRAVENOUS | Status: AC
  Administered 2022-09-04: 1 g via INTRAVENOUS
  Filled 2022-09-04: qty 10

## 2022-09-04 MED ORDER — PREDNISONE 20 MG PO TABS: 40.0000 mg | ORAL_TABLET | Freq: Every day | ORAL | Status: DC

## 2022-09-04 MED ORDER — MOMETASONE FURO-FORMOTEROL FUM 200-5 MCG/ACT IN AERO
2.0000 | INHALATION_SPRAY | Freq: Two times a day (BID) | RESPIRATORY_TRACT | Status: DC
  Administered 2022-09-04 – 2022-09-07 (×6): 2 via RESPIRATORY_TRACT
  Filled 2022-09-04: qty 8.8

## 2022-09-04 MED ORDER — ONDANSETRON HCL 4 MG/2ML IJ SOLN: 4.0000 mg | Freq: Four times a day (QID) | INTRAMUSCULAR | Status: DC | PRN

## 2022-09-04 MED ORDER — PANTOPRAZOLE SODIUM 40 MG PO TBEC
40.0000 mg | DELAYED_RELEASE_TABLET | Freq: Every day | ORAL | Status: DC
  Administered 2022-09-04 – 2022-09-07 (×4): 40 mg via ORAL
  Filled 2022-09-04 (×4): qty 1

## 2022-09-04 MED ORDER — LACTATED RINGERS IV SOLN: INTRAVENOUS | Status: DC

## 2022-09-04 MED ORDER — HYDROCODONE-ACETAMINOPHEN 10-325 MG PO TABS
1.0000 | ORAL_TABLET | Freq: Three times a day (TID) | ORAL | Status: DC | PRN
  Administered 2022-09-04 – 2022-09-07 (×6): 1 via ORAL
  Filled 2022-09-04 (×6): qty 1

## 2022-09-04 MED ORDER — ALBUTEROL SULFATE (2.5 MG/3ML) 0.083% IN NEBU: 2.5000 mg | INHALATION_SOLUTION | Freq: Four times a day (QID) | RESPIRATORY_TRACT | Status: DC

## 2022-09-04 MED ORDER — ALPRAZOLAM 0.5 MG PO TABS
0.5000 mg | ORAL_TABLET | Freq: Every day | ORAL | Status: DC
  Administered 2022-09-04 – 2022-09-07 (×4): 0.5 mg via ORAL
  Filled 2022-09-04 (×4): qty 1

## 2022-09-04 MED ORDER — MAGNESIUM SULFATE 2 GM/50ML IV SOLN
2.0000 g | Freq: Once | INTRAVENOUS | Status: AC
  Administered 2022-09-04: 2 g via INTRAVENOUS
  Filled 2022-09-04: qty 50

## 2022-09-04 MED ORDER — IPRATROPIUM BROMIDE 0.02 % IN SOLN: 0.5000 mg | Freq: Four times a day (QID) | RESPIRATORY_TRACT | Status: DC

## 2022-09-04 MED ORDER — TRAZODONE HCL 50 MG PO TABS: 25.0000 mg | ORAL_TABLET | Freq: Every evening | ORAL | Status: DC | PRN

## 2022-09-04 MED ORDER — LACTATED RINGERS IV BOLUS (SEPSIS)
1000.0000 mL | Freq: Once | INTRAVENOUS | Status: AC
  Administered 2022-09-04: 1000 mL via INTRAVENOUS

## 2022-09-04 MED ORDER — SODIUM CHLORIDE 0.9 % IV SOLN
500.0000 mg | INTRAVENOUS | Status: DC
  Administered 2022-09-04: 500 mg via INTRAVENOUS
  Filled 2022-09-04: qty 5

## 2022-09-04 MED ORDER — ALBUTEROL SULFATE (2.5 MG/3ML) 0.083% IN NEBU: 2.5000 mg | INHALATION_SOLUTION | RESPIRATORY_TRACT | Status: DC | PRN

## 2022-09-04 MED ORDER — METHYLPREDNISOLONE SODIUM SUCC 125 MG IJ SOLR
125.0000 mg | Freq: Once | INTRAMUSCULAR | Status: AC
  Administered 2022-09-04: 125 mg via INTRAVENOUS
  Filled 2022-09-04: qty 2

## 2022-09-04 MED ORDER — IPRATROPIUM-ALBUTEROL 0.5-2.5 (3) MG/3ML IN SOLN
3.0000 mL | Freq: Four times a day (QID) | RESPIRATORY_TRACT | Status: DC
  Administered 2022-09-04 – 2022-09-05 (×5): 3 mL via RESPIRATORY_TRACT
  Filled 2022-09-04 (×5): qty 3

## 2022-09-04 MED ORDER — ACETAMINOPHEN 650 MG RE SUPP: 650.0000 mg | Freq: Four times a day (QID) | RECTAL | Status: DC | PRN

## 2022-09-04 MED ORDER — SODIUM CHLORIDE 0.9 % IV SOLN
2.0000 g | INTRAVENOUS | Status: DC
  Administered 2022-09-05 – 2022-09-06 (×2): 2 g via INTRAVENOUS
  Filled 2022-09-04 (×2): qty 20

## 2022-09-04 MED ORDER — LACTATED RINGERS IV BOLUS (SEPSIS)
500.0000 mL | Freq: Once | INTRAVENOUS | Status: AC
  Administered 2022-09-04: 500 mL via INTRAVENOUS

## 2022-09-04 MED ORDER — HEPARIN SODIUM (PORCINE) 5000 UNIT/ML IJ SOLN
5000.0000 [IU] | Freq: Three times a day (TID) | INTRAMUSCULAR | Status: DC
  Administered 2022-09-04 – 2022-09-05 (×2): 5000 [IU] via SUBCUTANEOUS
  Filled 2022-09-04 (×2): qty 1

## 2022-09-04 MED ORDER — METHYLPREDNISOLONE SODIUM SUCC 40 MG IJ SOLR
40.0000 mg | Freq: Two times a day (BID) | INTRAMUSCULAR | Status: AC
  Administered 2022-09-04 – 2022-09-05 (×2): 40 mg via INTRAVENOUS
  Filled 2022-09-04 (×2): qty 1

## 2022-09-04 MED ORDER — ONDANSETRON HCL 4 MG PO TABS: 4.0000 mg | ORAL_TABLET | Freq: Four times a day (QID) | ORAL | Status: DC | PRN

## 2022-09-04 MED ORDER — NICOTINE 21 MG/24HR TD PT24
21.0000 mg | MEDICATED_PATCH | Freq: Every day | TRANSDERMAL | Status: DC
  Administered 2022-09-04 – 2022-09-07 (×4): 21 mg via TRANSDERMAL
  Filled 2022-09-04 (×4): qty 1

## 2022-09-04 MED ORDER — SODIUM CHLORIDE 0.9 % IV BOLUS
1000.0000 mL | Freq: Once | INTRAVENOUS | Status: AC
  Administered 2022-09-04: 1000 mL via INTRAVENOUS

## 2022-09-04 MED ORDER — GUAIFENESIN ER 600 MG PO TB12
600.0000 mg | ORAL_TABLET | Freq: Two times a day (BID) | ORAL | Status: DC
  Administered 2022-09-04 – 2022-09-05 (×2): 600 mg via ORAL
  Filled 2022-09-04 (×2): qty 1

## 2022-09-04 MED ORDER — ACETAMINOPHEN 325 MG PO TABS
650.0000 mg | ORAL_TABLET | Freq: Four times a day (QID) | ORAL | Status: DC | PRN
  Administered 2022-09-05: 650 mg via ORAL
  Filled 2022-09-04: qty 2

## 2022-09-04 MED ORDER — IPRATROPIUM-ALBUTEROL 0.5-2.5 (3) MG/3ML IN SOLN
3.0000 mL | Freq: Once | RESPIRATORY_TRACT | Status: AC
  Administered 2022-09-04: 3 mL via RESPIRATORY_TRACT
  Filled 2022-09-04: qty 3

## 2022-09-04 MED ORDER — SODIUM CHLORIDE 0.9 % IV SOLN: 500.0000 mg | INTRAVENOUS | Status: DC

## 2022-09-04 NOTE — H&P (Signed)
TRH H&P   Patient Demographics:    Erin Hobbs, is a 62 y.o. female  MRN: [id number]   DOB - 07/06/1960  Admit Date - 09/04/2022  Outpatient Primary MD for the patient is Royce Macadamia, Mike Gip., MD  Referring MD/NP/PA: PA Small  Patient coming from: home  Chief Complaint  Patient presents with   Cough   Dizziness      HPI:    Erin Hobbs  is a 62 y.o. female, with medical history significant for COPD, chronic hypoxic respiratory failure on 2.5 L oxygen, and tobacco abuse (reports she quit 4 days ago). -Presents to ED secondary to complaints of cough, wheezing and shortness of breath patient reports symptoms has been going on for last 3 weeks, reports she has been using nebulizer at home without much improvement, actually he thinks it caused her symptoms to worsen and, she is on 2.5 L oxygen, but despite that remains she has been short of breath, coughing, nonproductive, denies chest pain, hemoptysis, fever or chills. -In ED she was saturating 85% on home oxygen 2.5 L, was increased to 5 L, chest x-ray showing worsening bibasilar thickening, she had white blood cell elevated at 15, lactic acid reassuring within normal limit, given her hypoxia, leukocytosis, [facility] hospitalist consulted to admit.    Review of systems:      A full 10 point Review of Systems was done, except as stated above, all other Review of Systems were negative.   With Past History of the following :    Past Medical History:  Diagnosis Date   Arthritis    COPD (chronic obstructive pulmonary disease) ([place])    Ulcer       Past Surgical History:  Procedure Laterality Date   TUBAL LIGATION        Social History:     Social History   Tobacco Use   Smoking status: Every Day    Packs/day: .5    Types: Cigarettes   Smokeless tobacco: Never  Substance Use Topics   Alcohol use: No         History reviewed. No pertinent family history.    Home Medications:   Prior to Admission medications   Medication Sig Start Date End Date Taking? Authorizing Provider  ALPRAZolam Duanne Moron) 0.5 MG tablet Take 1 tablet (0.5 mg total) by mouth daily. Do not take close to bedtime for safety reasons. 08/17/22     amphetamine-dextroamphetamine (ADDERALL) 10 MG tablet Take 10 mg by mouth every other day. 02/07/20   [provider]  amphetamine-dextroamphetamine (ADDERALL) 10 MG tablet Take 1 tablet (10 mg total) by mouth 2 (two) times daily. 03/29/22     amphetamine-dextroamphetamine (ADDERALL) 10 MG tablet Take 1 tablet (10 mg total) by mouth 2 (two) times daily. 05/13/22     amphetamine-dextroamphetamine (ADDERALL) 10 MG tablet Take 1 (one) Tablet by mouth two times daily 06/20/22  amphetamine-dextroamphetamine (ADDERALL) 10 MG tablet Take 1 (one) Tablet by mouth two times daily 07/19/22     amphetamine-dextroamphetamine (ADDERALL) 10 MG tablet Take 1 tablet (10 mg total) by mouth 2 (two) times daily. 08/17/22     azithromycin (ZITHROMAX) 250 MG tablet Take 1 tablet (250 mg total) by mouth daily. 02/23/20   Barton Dubois, MD  benzonatate (TESSALON) 200 MG capsule Take 1 capsule (200 mg total) by mouth 3 (three) times daily as needed for cough. Swallow whole, do not chew 09/06/20   Triplett, Tammy, PA-C  budesonide-formoterol (SYMBICORT) 160-4.5 MCG/ACT inhaler Inhale 2 puffs into the lungs 2 (two) times daily.    [provider]  [employer] VITAMIN C 1000 MG tablet Take 1,000 mg by mouth daily. 10/01/19   [provider]  gabapentin (NEURONTIN) 100 MG capsule Take 100 mg by mouth 4 (four) times daily as needed. Nerve pain 01/18/20   [provider]  HYDROcodone-acetaminophen (NORCO) 10-325 MG tablet Take 1 tablet by mouth 3 (three) times daily as needed for moderate pain. (back) 02/10/20   [provider]  HYDROcodone-acetaminophen (NORCO) 10-325 MG tablet Take 1 tablet by  mouth 2 (two) times daily as needed. 08/17/22     Ipratropium-Albuterol (COMBIVENT) 20-100 MCG/ACT AERS respimat Inhale 1 puff into the lungs every 6 (six) hours. 02/23/20   Barton Dubois, MD  ipratropium-albuterol (DUONEB) 0.5-2.5 (3) MG/3ML SOLN Take 3 mLs by nebulization every 6 (six) hours as needed (SOB/wheezing not improved with the use of inhaler.). 02/23/20   Barton Dubois, MD  nicotine (NICODERM CQ - DOSED IN MG/24 HOURS) 21 mg/24hr patch Place 1 patch (21 mg total) onto the skin daily. 02/24/20   Barton Dubois, MD  ondansetron (ZOFRAN ODT) 4 MG disintegrating tablet Take 1 tablet (4 mg total) by mouth every 8 (eight) hours as needed. 07/13/20   McDonald, Mia A, PA-C  pantoprazole (PROTONIX) 40 MG tablet Take 1 tablet (40 mg total) by mouth daily. 02/23/20 02/22/21  Barton Dubois, MD  predniSONE (DELTASONE) 20 MG tablet Take 3 tablets daily x1 day; then 2 tablets by mouth daily x2 days; then 1 tablet by mouth daily x3 days; then half tablet by mouth daily x3 days and stop prednisone. 02/23/20   Barton Dubois, MD  Vitamin D, Ergocalciferol, (DRISDOL) 1.25 MG (50000 UNIT) CAPS capsule Take 50,000 Units by mouth every Monday. 02/07/20   [provider]     Allergies:     Allergies  Allergen Reactions   Aspirin Other (See Comments)    Cannot take due to stomach ulcer   Mushroom Extract Complex Other (See Comments)    Dizziness      Physical Exam:   Vitals  Blood pressure 124/70, pulse (!) 111, temperature 98.6 F (37 C), temperature source Oral, resp. rate 19, height 5\' 4"  (1.626 m), weight 79.8 kg, SpO2 96 %.   1. General, well-developed female, laying in bed, no apparent distress  2. Normal affect and insight, Not Suicidal or Homicidal, Awake Alert, Oriented X 3.  3. No F.N deficits, ALL C.Nerves Intact, Strength 5/5 all 4 extremities, Sensation intact all 4 extremities, Plantars down going.  4. Ears and Eyes appear Normal, Conjunctivae clear, PERRLA. Moist Oral  Mucosa.  5. Supple Neck, No JVD, No cervical lymphadenopathy appriciated, No Carotid Bruits.  6. Symmetrical Chest wall movement, basilar Rales and wheezing, but otherwise no respiratory distress or use of accessory muscles  7. RRR, No Gallops, Rubs or Murmurs, No Parasternal Heave.  8. Positive Bowel  Sounds, Abdomen Soft, No tenderness, No organomegaly appriciated,No rebound -guarding or rigidity.  9.  No Cyanosis, Normal Skin Turgor, No Skin Rash or Bruise.  10. Good muscle tone,  joints appear normal , no effusions, Normal ROM.     Data Review:    CBC Recent Labs  Lab 09/04/22 1438  WBC 15.4*  HGB 13.3  HCT 41.0  PLT 219  MCV 88.0  MCH 28.5  MCHC 32.4  RDW 13.5  LYMPHSABS 1.6  MONOABS 1.7*  EOSABS 0.1  BASOSABS 0.1   ------------------------------------------------------------------------------------------------------------------  Chemistries  Recent Labs  Lab 09/04/22 1438  NA 132*  K 3.6  CL 99  CO2 24  GLUCOSE 167*  BUN 9  CREATININE 0.75  CALCIUM 8.2*  AST 18  ALT 16  ALKPHOS 79  BILITOT 0.8   ------------------------------------------------------------------------------------------------------------------ estimated creatinine clearance is 75.4 mL/min (by C-G formula based on SCr of 0.75 mg/dL). ------------------------------------------------------------------------------------------------------------------ No results for input(s): "TSH", "T4TOTAL", "T3FREE", "THYROIDAB" in the last 72 hours.  Invalid input(s): "FREET3"  Coagulation profile No results for input(s): "INR", "PROTIME" in the last 168 hours. ------------------------------------------------------------------------------------------------------------------- No results for input(s): "DDIMER" in the last 72 hours. -------------------------------------------------------------------------------------------------------------------  Cardiac Enzymes No results for input(s): "CKMB",  "TROPONINI", "MYOGLOBIN" in the last 168 hours.  Invalid input(s): "CK" ------------------------------------------------------------------------------------------------------------------ No results found for: "BNP"   ---------------------------------------------------------------------------------------------------------------  Urinalysis    Component Value Date/Time   COLORURINE YELLOW 08/19/2014 1501   APPEARANCEUR CLEAR 08/19/2014 1501   LABSPEC >1.030 (H) 08/19/2014 1501   PHURINE 5.0 08/19/2014 1501   GLUCOSEU NEGATIVE 08/19/2014 1501   HGBUR MODERATE (A) 08/19/2014 1501   BILIRUBINUR NEGATIVE 08/19/2014 1501   KETONESUR NEGATIVE 08/19/2014 1501   PROTEINUR NEGATIVE 08/19/2014 1501   UROBILINOGEN 0.2 08/19/2014 1501   NITRITE NEGATIVE 08/19/2014 1501   LEUKOCYTESUR SMALL (A) 08/19/2014 1501    ----------------------------------------------------------------------------------------------------------------   Imaging Results:    DG Chest 2 View  Result Date: 09/04/2022 CLINICAL DATA:  Dizziness and productive cough for 4 days. Short of breath. EXAM: CHEST - 2 VIEW COMPARISON:  09/06/2020. FINDINGS: Coarse interstitial thickening, most evident in the lower lungs, right greater than left, has increased compared to the prior exam. No lung consolidation. No pleural effusion or pneumothorax. Cardiac silhouette normal in size and configuration. Normal mediastinal and hilar contours. Skeletal structures are intact. IMPRESSION: 1. Coarse bilateral interstitial thickening in the lower lungs, greater on the right, increased from prior chest radiographs. Findings may reflect a component of acute interstitial infection/inflammation superimposed on chronic thickening. No evidence of lobar pneumonia and no pulmonary edema. Electronically Signed   By: Lajean Manes M.D.   On: 09/04/2022 15:32       Assessment & Plan:    Principal Problem:   COPD exacerbation ([place]) Active Problems:   COPD  with acute exacerbation (HCC)   Gastroesophageal reflux disease   Bronchiectasis with acute exacerbation (HCC)   Acute on chronic hypoxic respiratory failure (HCC)   Acute on chronic hypoxic respiratory failure COPD exacerbation Bibasilar/atypical pneumonia -Patient presents with worsening dyspnea, increased oxygen requirement, saturating 85% on 2.5 L nasal cannula which is her home baseline, currently requiring up to 5 L oxygen. -Significant wheezing presented to ED, improved with IV steroids, continue with IV steroids and transition to p.o. prednisone and 24 hours. -Patient with worsening by basilar thickening, possible early/atypical pneumonia, will check respiratory viral pathogen, sputum cultures, strep pneumonia and Legionella antigen -Was encouraged use incentive spirometry and flutter valve, will start on Mucinex -Continue with IV Rocephin  and azithromycin.   GERD - Continue with PPI  Tobacco abuse -Patient reports she quit smoking 4 days ago, she congratulated about that, will keep on nicotine patch during hospital stay  Chronic pain syndrome -Continue with home medications   DVT Prophylaxis Heparin  AM Labs Ordered, also please review Full Orders  Family Communication: Admission, patients condition and plan of care including tests being ordered have been discussed with the patient who indicate understanding and agree with the plan and Code Status.  Code Status Full  Likely DC to  home  Condition GUARDED    Consults called: none    Admission status: Observation    Time spent in minutes : 60 minutes   Phillips Climes M.D on 09/04/2022 at 4:30 PM   [facility] - Office  [phone]

## 2022-09-04 NOTE — ED Triage Notes (Signed)
Pt complains of dizziness and productive cough x 4 days. SOB and chest feels heavy.

## 2022-09-04 NOTE — ED Provider Notes (Signed)
[facility] Provider Note   CSN: [id number] Arrival date & time: 09/04/22  1417     History  Chief Complaint  Patient presents with   Cough   Dizziness    Erin Hobbs is a 62 y.o. female, history of COPD, respiratory failure, who presents to the ED secondary to shortness of breath chest tightness, productive cough is been going on for the last 2 weeks.  She states that she was sick a couple weeks ago, tried using her nebulizer, and believes that it may not have been clean as she started having worsening symptoms, and difficulty breathing.  Noticed today that her oxygen was dropping below, typically is on 2.5 L as needed, for her COPD, constant when she is sick however, and she noticed her oxygen was dipping below so she came to the ER.  Last albuterol treatment was on the way to the hospital.  Denies any nausea, vomiting, abdominal pain.  Does endorse chest tightness.     Home Medications Prior to Admission medications   Medication Sig Start Date End Date Taking? Authorizing Provider  ALPRAZolam Duanne Moron) 0.5 MG tablet Take 1 tablet (0.5 mg total) by mouth daily. Do not take close to bedtime for safety reasons. 08/17/22     amphetamine-dextroamphetamine (ADDERALL) 10 MG tablet Take 10 mg by mouth every other day. 02/07/20   [provider]  amphetamine-dextroamphetamine (ADDERALL) 10 MG tablet Take 1 tablet (10 mg total) by mouth 2 (two) times daily. 03/29/22     amphetamine-dextroamphetamine (ADDERALL) 10 MG tablet Take 1 tablet (10 mg total) by mouth 2 (two) times daily. 05/13/22     amphetamine-dextroamphetamine (ADDERALL) 10 MG tablet Take 1 (one) Tablet by mouth two times daily 06/20/22     amphetamine-dextroamphetamine (ADDERALL) 10 MG tablet Take 1 (one) Tablet by mouth two times daily 07/19/22     amphetamine-dextroamphetamine (ADDERALL) 10 MG tablet Take 1 tablet (10 mg total) by mouth 2 (two) times daily. 08/17/22     azithromycin  (ZITHROMAX) 250 MG tablet Take 1 tablet (250 mg total) by mouth daily. 02/23/20   Barton Dubois, MD  benzonatate (TESSALON) 200 MG capsule Take 1 capsule (200 mg total) by mouth 3 (three) times daily as needed for cough. Swallow whole, do not chew 09/06/20   Triplett, Tammy, PA-C  budesonide-formoterol (SYMBICORT) 160-4.5 MCG/ACT inhaler Inhale 2 puffs into the lungs 2 (two) times daily.    [provider]  [employer]S VITAMIN C 1000 MG tablet Take 1,000 mg by mouth daily. 10/01/19   [provider]  gabapentin (NEURONTIN) 100 MG capsule Take 100 mg by mouth 4 (four) times daily as needed. Nerve pain 01/18/20   [provider]  HYDROcodone-acetaminophen (NORCO) 10-325 MG tablet Take 1 tablet by mouth 3 (three) times daily as needed for moderate pain. (back) 02/10/20   [provider]  HYDROcodone-acetaminophen (NORCO) 10-325 MG tablet Take 1 tablet by mouth 2 (two) times daily as needed. 08/17/22     Ipratropium-Albuterol (COMBIVENT) 20-100 MCG/ACT AERS respimat Inhale 1 puff into the lungs every 6 (six) hours. 02/23/20   Barton Dubois, MD  ipratropium-albuterol (DUONEB) 0.5-2.5 (3) MG/3ML SOLN Take 3 mLs by nebulization every 6 (six) hours as needed (SOB/wheezing not improved with the use of inhaler.). 02/23/20   Barton Dubois, MD  nicotine (NICODERM CQ - DOSED IN MG/24 HOURS) 21 mg/24hr patch Place 1 patch (21 mg total) onto the skin daily. 02/24/20   Barton Dubois, MD  ondansetron (  ZOFRAN ODT) 4 MG disintegrating tablet Take 1 tablet (4 mg total) by mouth every 8 (eight) hours as needed. 07/13/20   McDonald, Mia A, PA-C  pantoprazole (PROTONIX) 40 MG tablet Take 1 tablet (40 mg total) by mouth daily. 02/23/20 02/22/21  Barton Dubois, MD  predniSONE (DELTASONE) 20 MG tablet Take 3 tablets daily x1 day; then 2 tablets by mouth daily x2 days; then 1 tablet by mouth daily x3 days; then half tablet by mouth daily x3 days and stop prednisone. 02/23/20   Barton Dubois, MD  Vitamin D,  Ergocalciferol, (DRISDOL) 1.25 MG (50000 UNIT) CAPS capsule Take 50,000 Units by mouth every Monday. 02/07/20   [provider]      Allergies    Aspirin and Mushroom extract complex    Review of Systems   Review of Systems  Constitutional:  Negative for fever.  Respiratory:  Positive for cough, chest tightness and shortness of breath.   Neurological:  Positive for dizziness.    Physical Exam Updated Vital Signs BP 124/70   Pulse (!) 111   Temp 98.6 F (37 C) (Oral)   Resp 19   Ht 5\' 4"  (1.626 m)   Wt 79.8 kg   SpO2 96%   BMI 30.20 kg/m  Physical Exam Vitals and nursing note reviewed.  Constitutional:      General: She is not in acute distress.    Appearance: She is well-developed.  HENT:     Head: Normocephalic and atraumatic.  Eyes:     Conjunctiva/sclera: Conjunctivae normal.  Cardiovascular:     Rate and Rhythm: Regular rhythm. Tachycardia present.     Heart sounds: No murmur heard. Pulmonary:     Effort: Tachypnea present.     Comments: Wheezing at bases. +5L O2 Abdominal:     Palpations: Abdomen is soft.     Tenderness: There is no abdominal tenderness.  Musculoskeletal:        General: No swelling.     Cervical back: Neck supple.  Skin:    General: Skin is warm and dry.     Capillary Refill: Capillary refill takes less than 2 seconds.  Neurological:     Mental Status: She is alert.  Psychiatric:        Mood and Affect: Mood normal.     ED Results / Procedures / Treatments   Labs (all labs ordered are listed, but only abnormal results are displayed) Labs Reviewed  COMPREHENSIVE METABOLIC PANEL - Abnormal; Notable for the following components:      Result Value   Sodium 132 (*)    Glucose, Bld 167 (*)    Calcium 8.2 (*)    All other components within normal limits  CBC WITH DIFFERENTIAL/PLATELET - Abnormal; Notable for the following components:   WBC 15.4 (*)    Neutro Abs 11.9 (*)    Monocytes Absolute 1.7 (*)    Abs Immature  Granulocytes 0.08 (*)    All other components within normal limits  RESP PANEL BY RT-PCR (RSV, FLU A&B, COVID)  RVPGX2  CULTURE, BLOOD (ROUTINE X 2)  CULTURE, BLOOD (ROUTINE X 2)  LACTIC ACID, PLASMA  LACTIC ACID, PLASMA    EKG None  Radiology DG Chest 2 View  Result Date: 09/04/2022 CLINICAL DATA:  Dizziness and productive cough for 4 days. Short of breath. EXAM: CHEST - 2 VIEW COMPARISON:  09/06/2020. FINDINGS: Coarse interstitial thickening, most evident in the lower lungs, right greater than left, has increased compared to the prior exam. No  lung consolidation. No pleural effusion or pneumothorax. Cardiac silhouette normal in size and configuration. Normal mediastinal and hilar contours. Skeletal structures are intact. IMPRESSION: 1. Coarse bilateral interstitial thickening in the lower lungs, greater on the right, increased from prior chest radiographs. Findings may reflect a component of acute interstitial infection/inflammation superimposed on chronic thickening. No evidence of lobar pneumonia and no pulmonary edema. Electronically Signed   By: Lajean Manes M.D.   On: 09/04/2022 15:32    Procedures Procedures    Medications Ordered in ED Medications  ipratropium-albuterol (DUONEB) 0.5-2.5 (3) MG/3ML nebulizer solution 3 mL (has no administration in time range)  lactated ringers infusion (has no administration in time range)  lactated ringers bolus 1,000 mL (1,000 mLs Intravenous New Bag/Given 09/04/22 1623)    And  lactated ringers bolus 1,000 mL (has no administration in time range)    And  lactated ringers bolus 500 mL (has no administration in time range)  azithromycin (ZITHROMAX) 500 mg in sodium chloride 0.9 % 250 mL IVPB (500 mg Intravenous New Bag/Given 09/04/22 1622)  magnesium sulfate IVPB 2 g 50 mL (0 g Intravenous Stopped 09/04/22 1553)  methylPREDNISolone sodium succinate (SOLU-MEDROL) 125 mg/2 mL injection 125 mg (125 mg Intravenous Given 09/04/22 1447)  cefTRIAXone  (ROCEPHIN) 1 g in sodium chloride 0.9 % 100 mL IVPB (0 g Intravenous Stopped 09/04/22 1517)  sodium chloride 0.9 % bolus 1,000 mL (0 mLs Intravenous Stopped 09/04/22 1549)    ED Course/ Medical Decision Making/ A&P                             Medical Decision Making Patient is a 62 year old female, history of COPD, on 2.5 L O2 baseline, [place] 5 L of O2, and tachypneic, tachycardic.  She had wheezing bilateral lower lobes, complaint of feeling sick for the last month.  We will obtain chest x-ray, blood work, for further evaluation as well as give her ceftriaxone, mag, and Solu-Medrol.  Amount and/or Complexity of Data Reviewed Labs: ordered.    Details: White count of 15.4 K Radiology: ordered.    Details: Chest x-ray shows some bilateral interstitial prominence Discussion of management or test interpretation with external provider(s): Discussed with Dr. Waldron Labs, we will admit patient for COPD exacerbation concerns for atypical pneumonia.  She has had infectious symptoms for the past month, and they are worsening, she was wheezing and improved after send medications, relates she may have some atypical pneumonia given the bilateral interstitial prominences thickening, and coarse breath sounds and wheezing.  Admitted for further the further respiratory control, and tapering of oxygen requirements as well as antibiotics.  Tachycardia has much improved since initial evaluation.  Now in the low 100s.  Risk Prescription drug management. Decision regarding hospitalization.   Final Clinical Impression(s) / ED Diagnoses Final diagnoses:  COPD exacerbation ([place])  Atypical pneumonia    Rx / DC Orders ED Discharge Orders     None         Diamantina Monks, Si Gaul, PA 09/04/22 1625    Teressa Lower, MD 09/04/22 2137

## 2022-09-04 NOTE — Progress Notes (Signed)
[facility] following for sepsis protocol. 

## 2022-09-05 DIAGNOSIS — J9621 Acute and chronic respiratory failure with hypoxia: Secondary | ICD-10-CM | POA: Diagnosis present

## 2022-09-05 DIAGNOSIS — Z886 Allergy status to analgesic agent status: Secondary | ICD-10-CM | POA: Diagnosis not present

## 2022-09-05 DIAGNOSIS — T380X5A Adverse effect of glucocorticoids and synthetic analogues, initial encounter: Secondary | ICD-10-CM | POA: Diagnosis not present

## 2022-09-05 DIAGNOSIS — Z1152 Encounter for screening for COVID-19: Secondary | ICD-10-CM | POA: Diagnosis not present

## 2022-09-05 DIAGNOSIS — G894 Chronic pain syndrome: Secondary | ICD-10-CM | POA: Diagnosis present

## 2022-09-05 DIAGNOSIS — E669 Obesity, unspecified: Secondary | ICD-10-CM | POA: Diagnosis not present

## 2022-09-05 DIAGNOSIS — K219 Gastro-esophageal reflux disease without esophagitis: Secondary | ICD-10-CM | POA: Diagnosis not present

## 2022-09-05 DIAGNOSIS — Z7951 Long term (current) use of inhaled steroids: Secondary | ICD-10-CM | POA: Diagnosis not present

## 2022-09-05 DIAGNOSIS — F17210 Nicotine dependence, cigarettes, uncomplicated: Secondary | ICD-10-CM | POA: Diagnosis present

## 2022-09-05 DIAGNOSIS — J470 Bronchiectasis with acute lower respiratory infection: Secondary | ICD-10-CM | POA: Diagnosis present

## 2022-09-05 DIAGNOSIS — J189 Pneumonia, unspecified organism: Secondary | ICD-10-CM | POA: Diagnosis present

## 2022-09-05 DIAGNOSIS — M1990 Unspecified osteoarthritis, unspecified site: Secondary | ICD-10-CM | POA: Diagnosis present

## 2022-09-05 DIAGNOSIS — J471 Bronchiectasis with (acute) exacerbation: Secondary | ICD-10-CM | POA: Diagnosis present

## 2022-09-05 DIAGNOSIS — Z6830 Body mass index (BMI) 30.0-30.9, adult: Secondary | ICD-10-CM | POA: Diagnosis not present

## 2022-09-05 DIAGNOSIS — J441 Chronic obstructive pulmonary disease with (acute) exacerbation: Secondary | ICD-10-CM | POA: Diagnosis present

## 2022-09-05 DIAGNOSIS — J440 Chronic obstructive pulmonary disease with acute lower respiratory infection: Secondary | ICD-10-CM | POA: Diagnosis present

## 2022-09-05 DIAGNOSIS — Z79899 Other long term (current) drug therapy: Secondary | ICD-10-CM | POA: Diagnosis not present

## 2022-09-05 DIAGNOSIS — Z9109 Other allergy status, other than to drugs and biological substances: Secondary | ICD-10-CM | POA: Diagnosis not present

## 2022-09-05 LAB — CBC
HCT: 35.2 % — ABNORMAL LOW (ref 36.0–46.0)
Hemoglobin: 11.4 g/dL — ABNORMAL LOW (ref 12.0–15.0)
MCH: 28.5 pg (ref 26.0–34.0)
MCHC: 32.4 g/dL (ref 30.0–36.0)
MCV: 88 fL (ref 80.0–100.0)
Platelets: 219 10*3/uL (ref 150–400)
RBC: 4 MIL/uL (ref 3.87–5.11)
RDW: 13.2 % (ref 11.5–15.5)
WBC: 11.6 10*3/uL — ABNORMAL HIGH (ref 4.0–10.5)
nRBC: 0 % (ref 0.0–0.2)

## 2022-09-05 LAB — BASIC METABOLIC PANEL
Anion gap: 6 (ref 5–15)
BUN: 7 mg/dL — ABNORMAL LOW (ref 8–23)
CO2: 25 mmol/L (ref 22–32)
Calcium: 8 mg/dL — ABNORMAL LOW (ref 8.9–10.3)
Chloride: 106 mmol/L (ref 98–111)
Creatinine, Ser: 0.52 mg/dL (ref 0.44–1.00)
GFR, Estimated: >60 mL/min (ref >60)
Glucose, Bld: 199 mg/dL — ABNORMAL HIGH (ref 70–99)
Potassium: 3.9 mmol/L (ref 3.5–5.1)
Sodium: 137 mmol/L (ref 135–145)

## 2022-09-05 LAB — HIV ANTIBODY (ROUTINE TESTING W REFLEX): HIV Screen 4th Generation wRfx: NONREACTIVE

## 2022-09-05 MED ORDER — BENZONATATE 100 MG PO CAPS: 100.0000 mg | ORAL_CAPSULE | Freq: Two times a day (BID) | ORAL | Status: DC | PRN

## 2022-09-05 MED ORDER — IPRATROPIUM-ALBUTEROL 0.5-2.5 (3) MG/3ML IN SOLN
3.0000 mL | Freq: Three times a day (TID) | RESPIRATORY_TRACT | Status: DC
  Administered 2022-09-06 – 2022-09-07 (×4): 3 mL via RESPIRATORY_TRACT
  Filled 2022-09-05 (×4): qty 3

## 2022-09-05 MED ORDER — GUAIFENESIN-DM 100-10 MG/5ML PO SYRP
5.0000 mL | ORAL_SOLUTION | Freq: Four times a day (QID) | ORAL | Status: DC
  Administered 2022-09-05 – 2022-09-07 (×9): 5 mL via ORAL
  Filled 2022-09-05 (×9): qty 5

## 2022-09-05 MED ORDER — AZITHROMYCIN 250 MG PO TABS
500.0000 mg | ORAL_TABLET | Freq: Every day | ORAL | Status: DC
  Administered 2022-09-05 – 2022-09-07 (×3): 500 mg via ORAL
  Filled 2022-09-05 (×3): qty 2

## 2022-09-05 MED ORDER — ENOXAPARIN SODIUM 40 MG/0.4ML IJ SOSY
40.0000 mg | PREFILLED_SYRINGE | INTRAMUSCULAR | Status: DC
  Administered 2022-09-05 – 2022-09-07 (×3): 40 mg via SUBCUTANEOUS
  Filled 2022-09-05 (×3): qty 0.4

## 2022-09-05 MED ORDER — SALINE SPRAY 0.65 % NA SOLN
1.0000 | NASAL | Status: DC | PRN
  Filled 2022-09-05: qty 44

## 2022-09-05 MED ORDER — METHYLPREDNISOLONE SODIUM SUCC 40 MG IJ SOLR
40.0000 mg | Freq: Every day | INTRAMUSCULAR | Status: DC
  Filled 2022-09-05 (×2): qty 1

## 2022-09-05 MED ORDER — IPRATROPIUM-ALBUTEROL 0.5-2.5 (3) MG/3ML IN SOLN: 3.0000 mL | RESPIRATORY_TRACT | Status: DC | PRN

## 2022-09-05 MED ORDER — LIDOCAINE 5 % EX PTCH
1.0000 | MEDICATED_PATCH | CUTANEOUS | Status: DC
  Administered 2022-09-05 – 2022-09-07 (×3): 1 via TRANSDERMAL
  Filled 2022-09-05 (×3): qty 1

## 2022-09-05 NOTE — Progress Notes (Signed)
  Progress Note   Patient: Erin Hobbs [id number] DOB: [birth year] DOA: 09/04/2022     0 DOS: the patient was seen and examined on 09/05/2022   Brief hospital course: [name] was admitted to the hospital with the working diagnosis of community acquired pneumonia.   62 yo female with the past medical history of COPD, chronic hypoxemic respiratory failure and tobacco abuse who presented with cough, wheezing and dyspnea for the last 3 weeks. Symptoms refractive to bronchodilator use at home. On her initial physical examination her 02 saturation was 85% on 2.5 L/min per [place], blood pressure 124/70, HR 111. RR 19, lungs with rales and wheezing, heart with S1 and S2 present and rhythmic with no murmurs, or gallops, abdomen with no distention and no lower extremity edema.  NA 132, K 3,6 CL 99, bicarbonate 24 glucose 167, bun 9 cr 0,75  Lactic acid 1,1 Wbc 15,4 hgb 13,3 plt 219  Sars covid 19 negative   Chest radiograph with hyperinflated lungs, right lower lobe infiltrate, with no effusions.   Patient was placed on IV antibiotic therapy.     Assessment and Plan: * Right lower lobe pneumonia Positive pleuritic chest pain on the right side.   Plan to continue antibiotic therapy with ceftriaxone IV and oral azithromycin. Follow up cultures and cell count.  Check urinary legionella and sterp pneumo.  Discontinue IV fluids.   COPD with acute exacerbation ([place]) Acute on chronic hypoxemic respiratory failure.   Continue supplemental 02 per [place] to keep 02 saturation 88% or greater.  Continue bronchodilator therapy q6 and prn with duoneb, Inhaled and systemic corticosteroids (Iv methylprednisolone).   Airway clearing techniques with flutter valve and incentive spirometer.  Out of bed to chair tid with meals PT and OT.   Gastroesophageal reflux disease Continue with pantoprazole.   Class 1 obesity Calculated BMI is 30,2         Subjective: Patient feeling better but not yet back  to baseline, continue to have dyspnea cough and right pleuritic chest pain   Physical Exam: Vitals:   09/05/22 0215 09/05/22 0601 09/05/22 0802 09/05/22 0803  BP:  (!) 115/58    Pulse:  84    Resp:  20    Temp:  97.9 F (36.6 C)    TempSrc:  Oral    SpO2: 95% 95% 93% 96%  Weight:      Height:       Neurology awake and Hobbs ENT with mild pallor Cardiovascular with S1 and S2 present and rhythmic with no gallops, rubs or murmurs No JVD No lower extremity edema Respiratory with prolonged expiratory phase, expiratory wheezing and scattered rhonchi, positive rales at the right base,.  Abdomen with no distention  Data Reviewed:    Family Communication: no family at the bedside   Disposition: Status is: Observation The patient will require care spanning > 2 midnights and should be moved to inpatient because: IV antibiotics, IV steroids and 02 saturation monitoring   Planned Discharge Destination: Home      Author: Tawni Millers, MD 09/05/2022 9:23 AM  For on call review [email or web address].

## 2022-09-05 NOTE — Assessment & Plan Note (Addendum)
Acute on chronic hypoxemic respiratory failure.   Patient was treated with aggressive bronchodilator therapy, systemic and inhaled corticosteroids. Airway clearing techniques with flutter valve and incentive spirometer.  Antitussive agents.   Patient had significant improvement in her symptoms, by the time of her discharge she had no wheezing or rhonchi, her 02 saturation was 98% on 2 L/min per [place].  Plan to continue supplemental 02 at home.  Will give prescription for new nebulizer machine, that one she has is 62 years old and she is missing the tubing.  Smoking cessation and advices to stay up to date with immunizations for COVID 19 and influenza.

## 2022-09-05 NOTE — Assessment & Plan Note (Signed)
Continue with pantoprazole/.  

## 2022-09-05 NOTE — Assessment & Plan Note (Signed)
Calculated BMI is 30,2

## 2022-09-05 NOTE — Evaluation (Signed)
Physical Therapy Evaluation Patient Details Name: Erin Hobbs MRN: [id number] DOB: 1961/01/15 Today's Date: 09/05/2022  History of Present Illness  Loma Salts  is a 62 y.o. female, with medical history significant for COPD, chronic hypoxic respiratory failure on 2.5 L oxygen, and tobacco abuse (reports she quit 4 days ago).  -Presents to ED secondary to complaints of cough, wheezing and shortness of breath patient reports symptoms has been going on for last 3 weeks, reports she has been using nebulizer at home without much improvement, actually he thinks it caused her symptoms to worsen and, she is on 2.5 L oxygen, but despite that remains she has been short of breath, coughing, nonproductive, denies chest pain, hemoptysis, fever or chills.   Clinical Impression  Patient functioning near baseline for functional mobility and gait other than requiring use of SPC for longer distances mostly due to mild SOB and fatiguing easily with functional activities.  Patient demonstrates good return for use of cane (single point cane) with mostly step-to pattern without loss of balance.  Patient encouraged to ambulate ad lib in room and with nursing staff in hallways for length of stay.  Plan:  Patient discharged from physical therapy to care of nursing for ambulation daily as tolerated for length of stay.         Recommendations for follow up therapy are one component of a multi-disciplinary discharge planning process, led by the attending physician.  Recommendations may be updated based on patient status, additional functional criteria and insurance authorization.  Follow Up Recommendations       Assistance Recommended at Discharge Set up Supervision/Assistance  Patient can return home with the following  Help with stairs or ramp for entrance;Assistance with cooking/housework    Equipment Recommendations Cane  Recommendations for Other Services       Functional Status Assessment Patient has had  a recent decline in their functional status and demonstrates the ability to make significant improvements in function in a reasonable and predictable amount of time.     Precautions / Restrictions Precautions Precautions: None Restrictions Weight Bearing Restrictions: No      Mobility  Bed Mobility Overal bed mobility: Modified Independent                  Transfers Overall transfer level: Modified independent                      Ambulation/Gait Ambulation/Gait assistance: Modified independent (Device/Increase time) Gait Distance (Feet): 75 Feet Assistive device: Straight cane Gait Pattern/deviations: Step-to pattern, Decreased step length - right, Decreased step length - left, Decreased stride length Gait velocity: decreased     General Gait Details: slightly labored cadence with good return for using SPC with mostly step-to pattern without loss of balance, limited mostly due to fatigue and mild SOB while on 3 LPM O2  Stairs            Wheelchair Mobility    Modified Rankin (Stroke Patients Only)       Balance Overall balance assessment: Needs assistance Sitting-balance support: Feet supported, No upper extremity supported Sitting balance-Leahy Scale: Good Sitting balance - Comments: seated at EOB   Standing balance support: During functional activity, No upper extremity supported Standing balance-Leahy Scale: Fair Standing balance comment: fair/good without AD, good using SPC                             Pertinent Vitals/Pain Pain  Assessment Pain Assessment: No/denies pain    Home Living Family/patient expects to be discharged to:: Private residence Living Arrangements: Children;Other relatives Available Help at Discharge: Family;Available 24 hours/day Type of Home: House Home Access: Stairs to enter Entrance Stairs-Rails: None Entrance Stairs-Number of Steps: 3   Home Layout: Two level;Able to live on main level with  bedroom/bathroom Home Equipment: None      Prior Function Prior Level of Function : Independent/Modified Independent             Mobility Comments: Household and short distanced community ambulator without AD, on 2.5 LPM home O2 constant ADLs Comments: Independent     Hand Dominance   Dominant Hand: Right    Extremity/Trunk Assessment   Upper Extremity Assessment Upper Extremity Assessment: Overall WFL for tasks assessed    Lower Extremity Assessment Lower Extremity Assessment: Generalized weakness    Cervical / Trunk Assessment Cervical / Trunk Assessment: Normal  Communication   Communication: No difficulties  Cognition Arousal/Alertness: Awake/alert Behavior During Therapy: WFL for tasks assessed/performed Overall Cognitive Status: Within Functional Limits for tasks assessed                                          General Comments      Exercises     Assessment/Plan    PT Assessment Patient does not need any further PT services  PT Problem List         PT Treatment Interventions      PT Goals (Current goals can be found in the Care Plan section)  Acute Rehab PT Goals Patient Stated Goal: return home with family to assist PT Goal Formulation: With patient Time For Goal Achievement: 09/05/22 Potential to Achieve Goals: Good    Frequency       Co-evaluation               AM-PAC PT "6 Clicks" Mobility  Outcome Measure Help needed turning from your back to your side while in a flat bed without using bedrails?: None Help needed moving from lying on your back to sitting on the side of a flat bed without using bedrails?: None Help needed moving to and from a bed to a chair (including a wheelchair)?: None Help needed standing up from a chair using your arms (e.g., wheelchair or bedside chair)?: None Help needed to walk in hospital room?: None Help needed climbing 3-5 steps with a railing? : A Little 6 Click Score: 23     End of Session Equipment Utilized During Treatment: Oxygen Activity Tolerance: Patient tolerated treatment well;Patient limited by fatigue Patient left: in bed Nurse Communication: Mobility status PT Visit Diagnosis: Unsteadiness on feet (R26.81);Other abnormalities of gait and mobility (R26.89);Muscle weakness (generalized) (M62.81)    Time: [id number] PT Time Calculation (min) (ACUTE ONLY): 20 min   Charges:   PT Evaluation $PT Eval Moderate Complexity: 1 Mod PT Treatments $Therapeutic Activity: 8-22 mins        12:19 PM, 09/05/22 Lonell Grandchild, MPT Physical Therapist with [facility] [phone] office [phone] mobile phone

## 2022-09-05 NOTE — Assessment & Plan Note (Addendum)
Positive pleuritic chest pain on the right side.   Patient was placed on antibiotic therapy with ceftriaxone IV and oral azithromycin.  Her symptoms improved, she has remained afebrile and leukocytosis is improving. Cultures so far with no growth.   Sepsis has been ruled out.

## 2022-09-05 NOTE — Hospital Course (Addendum)
[name] was admitted to the hospital with the working diagnosis of community acquired pneumonia.   62 yo female with the past medical history of COPD, chronic hypoxemic respiratory failure and tobacco abuse who presented with cough, wheezing and dyspnea for the last 3 weeks. Symptoms refractive to bronchodilator use at home. On her initial physical examination her 02 saturation was 85% on 2.5 L/min per [place], blood pressure 124/70, HR 111. RR 19, lungs with rales and wheezing, heart with S1 and S2 present and rhythmic with no murmurs, or gallops, abdomen with no distention and no lower extremity edema.  NA 132, K 3,6 CL 99, bicarbonate 24 glucose 167, bun 9 cr 0,75  Lactic acid 1,1 Wbc 15,4 hgb 13,3 plt 219  Sars covid 19 negative   Chest radiograph with hyperinflated lungs, right lower lobe infiltrate, with no effusions.   Patient was placed on IV antibiotic therapy.    03/29 clinically improving but not yet back to baseline.  03/30 patient clinically improving, continue medical therapy at home. Follow up with primary care in 7 to 10 days.

## 2022-09-05 NOTE — TOC Initial Note (Signed)
Transition of Care [facility]) - Initial/Assessment Note    Patient Details  Name: Erin Hobbs MRN: [id number] Date of Birth: 03-Jul-1960  Transition of Care [facility] CM/SW Contact:    Boneta Lucks, RN Phone Number: 09/05/2022, 1:29 PM  Clinical Narrative:     Patient admitted with COPD exacerbation. Patient has home oxygen provided by [employer]. She states she only has a portable, she wants a Animal nutritionist. CM sent Erasmo Downer the Information to assess. She also needs new tubing for her neb machine. She is asking a cane. CM explained insurance will only pay for one walking device, she needs to self pay for a cane. TOC following.   Expected Discharge Plan: Home/Self Care Barriers to Discharge: Continued Medical Work up   Patient Goals and CMS Choice Patient states their goals for this hospitalization and ongoing recovery are:: to go home. CMS Medicare.gov Compare Post Acute Care list provided to:: Patient Choice offered to / list presented to : Patient    Expected Discharge Plan and Services      Living arrangements for the past 2 months: Single Family Home                   DME Agency: [employer] Date DME Agency Contacted: 09/05/22 Time DME Agency Contacted: [id number] Representative spoke with at DME Agency: Erasmo Downer   Prior Living Arrangements/Services Living arrangements for the past 2 months: [place] with:: Self Patient language and need for interpreter reviewed:: Yes        Need for Family Participation in Patient Care: Yes (Comment) Care giver support system in place?: Yes (comment)   Criminal Activity/Legal Involvement Pertinent to Current Situation/Hospitalization: No - Comment as needed  Activities of Daily Living Home Assistive Devices/Equipment: None ADL Screening (condition at time of admission) Patient's cognitive ability adequate to safely complete daily activities?: Yes Is the patient deaf or have difficulty hearing?: No Does the patient have  difficulty seeing, even when wearing glasses/contacts?: No Does the patient have difficulty concentrating, remembering, or making decisions?: No Patient able to express need for assistance with ADLs?: Yes Does the patient have difficulty dressing or bathing?: No Independently performs ADLs?: Yes (appropriate for developmental age) Does the patient have difficulty walking or climbing stairs?: No Weakness of Legs: None Weakness of Arms/Hands: None  Permission Sought/Granted     Emotional Assessment     Affect (typically observed): Accepting Orientation: : Oriented to Self, Oriented to Place, Oriented to  Time, Oriented to Situation Alcohol / Substance Use: Not Applicable Psych Involvement: No (comment)  Admission diagnosis:  Atypical pneumonia [J18.9] COPD exacerbation ([place]) [J44.1] Pneumonia [J18.9] Patient Active Problem List   Diagnosis Date Noted   Right lower lobe pneumonia 09/05/2022   Class 1 obesity 09/05/2022   Pneumonia 09/05/2022   COPD exacerbation ([place]) 09/04/2022   Acute on chronic hypoxic respiratory failure ([place]) 09/04/2022   Gastroesophageal reflux disease    Bronchiectasis with acute exacerbation ([facility])    COPD with acute exacerbation ([place]) 02/21/2020   Acute respiratory failure with hypoxia ([place]) 02/21/2020   Fever 02/21/2020   Tobacco abuse 02/21/2020   SIRS (systemic inflammatory response syndrome) ([place]) 02/21/2020   PCP:  Sherald Hess., MD Pharmacy:   [id number] - [place], [place] - [address] [place] [address] Phone: [phone] Fax: [phone]    Social Determinants of Health (SDOH) Social History: SDOH Screenings   Food Insecurity: No Food Insecurity (09/04/2022)  Housing: Low Risk  (09/04/2022)  Transportation Needs: No Transportation Needs (09/04/2022)  Utilities: Not At Risk (09/04/2022)  Tobacco Use: High Risk (09/04/2022)   SDOH Interventions: Housing  Interventions: Intervention Not Indicated  Readmission Risk Interventions    09/05/2022    1:26 PM  Readmission Risk Prevention Plan  Post Dischage Appt Not Complete  Medication Screening Complete  Transportation Screening Complete

## 2022-09-06 DIAGNOSIS — J189 Pneumonia, unspecified organism: Secondary | ICD-10-CM | POA: Diagnosis not present

## 2022-09-06 DIAGNOSIS — K219 Gastro-esophageal reflux disease without esophagitis: Secondary | ICD-10-CM | POA: Diagnosis not present

## 2022-09-06 DIAGNOSIS — J441 Chronic obstructive pulmonary disease with (acute) exacerbation: Secondary | ICD-10-CM | POA: Diagnosis not present

## 2022-09-06 LAB — BASIC METABOLIC PANEL
Anion gap: 8 (ref 5–15)
BUN: 10 mg/dL (ref 8–23)
CO2: 25 mmol/L (ref 22–32)
Calcium: 8.3 mg/dL — ABNORMAL LOW (ref 8.9–10.3)
Chloride: 107 mmol/L (ref 98–111)
Creatinine, Ser: 0.65 mg/dL (ref 0.44–1.00)
GFR, Estimated: >60 mL/min (ref >60)
Glucose, Bld: 136 mg/dL — ABNORMAL HIGH (ref 70–99)
Potassium: 4.1 mmol/L (ref 3.5–5.1)
Sodium: 140 mmol/L (ref 135–145)

## 2022-09-06 LAB — CBC
HCT: 37.5 % (ref 36.0–46.0)
Hemoglobin: 11.9 g/dL — ABNORMAL LOW (ref 12.0–15.0)
MCH: 28.5 pg (ref 26.0–34.0)
MCHC: 31.7 g/dL (ref 30.0–36.0)
MCV: 89.9 fL (ref 80.0–100.0)
Platelets: 274 10*3/uL (ref 150–400)
RBC: 4.17 MIL/uL (ref 3.87–5.11)
RDW: 13.5 % (ref 11.5–15.5)
WBC: 17.8 10*3/uL — ABNORMAL HIGH (ref 4.0–10.5)
nRBC: 0 % (ref 0.0–0.2)

## 2022-09-06 MED ORDER — METHYLPREDNISOLONE SODIUM SUCC 40 MG IJ SOLR
40.0000 mg | Freq: Every day | INTRAMUSCULAR | Status: DC
  Administered 2022-09-06 – 2022-09-07 (×2): 40 mg via INTRAVENOUS
  Filled 2022-09-06 (×2): qty 1

## 2022-09-06 NOTE — Progress Notes (Signed)
  Progress Note   Patient: Erin Hobbs [id number] DOB: July 31, 1960 DOA: 09/04/2022     1 DOS: the patient was seen and examined on 09/06/2022   Brief hospital course: [name] was admitted to the hospital with the working diagnosis of community acquired pneumonia.   62 yo female with the past medical history of COPD, chronic hypoxemic respiratory failure and tobacco abuse who presented with cough, wheezing and dyspnea for the last 3 weeks. Symptoms refractive to bronchodilator use at home. On her initial physical examination her 02 saturation was 85% on 2.5 L/min per [place], blood pressure 124/70, HR 111. RR 19, lungs with rales and wheezing, heart with S1 and S2 present and rhythmic with no murmurs, or gallops, abdomen with no distention and no lower extremity edema.  NA 132, K 3,6 CL 99, bicarbonate 24 glucose 167, bun 9 cr 0,75  Lactic acid 1,1 Wbc 15,4 hgb 13,3 plt 219  Sars covid 19 negative   Chest radiograph with hyperinflated lungs, right lower lobe infiltrate, with no effusions.   Patient was placed on IV antibiotic therapy.    03/29 clinically improving but not yet back to baseline.   Assessment and Plan: * Right lower lobe pneumonia Positive pleuritic chest pain on the right side.   Antibiotic therapy with ceftriaxone IV and oral azithromycin. Wbc is up to 17,8 but likely due to systemic steroids.  Blood cultures have been with no growth and patient has been afebrile.  Sepsis has been ruled out.  COPD with acute exacerbation ([place]) Acute on chronic hypoxemic respiratory failure.   Dyspnea, wheezing and cough are improving. Her 02 saturation is 94% on 3 L/min per [place]   Continue supplemental 02 per  to keep 02 saturation 88% or greater.  Continue bronchodilator therapy q6 and prn with duoneb, Inhaled and systemic corticosteroids (Iv methylprednisolone).   Airway clearing techniques with flutter valve and incentive spirometer.  Out of bed to chair tid with  meals PT and OT.   Gastroesophageal reflux disease Continue with pantoprazole.   Class 1 obesity Calculated BMI is 30,2         Subjective: Patient with improvement in her dyspnea, decreased cough and sputum production, her pleuritic chest pain is improving.   Physical Exam: Vitals:   09/05/22 2023 09/06/22 0525 09/06/22 0820 09/06/22 0825  BP: (!) 144/66 (!) 147/80    Pulse: 100 79    Resp: 16 18    Temp: 97.7 F (36.5 C) 97.9 F (36.6 C)    TempSrc: Oral Oral    SpO2: 95% 99% 94% 99%  Weight:      Height:       Neurology awake and Hobbs ENT with mild pallor Cardiovascular with S1 and S2 present and rhythmic with no gallops, rubs or murmurs Respirator with prolonged expiratory phase and scattered rales with no rhonchi or significant wheezing. Abdomen with no distention No lower extremity edema  Data Reviewed:    Family Communication: no family at the bedside   Disposition: Status is: Inpatient Remains inpatient appropriate because: Iv antibiotics  Planned Discharge Destination: Home possible discharge home tomorrow.     Author: Tawni Millers, MD 09/06/2022 1:03 PM  For on call review [email or web address].

## 2022-09-06 NOTE — Evaluation (Signed)
Occupational Therapy Evaluation Patient Details Name: Erin Hobbs MRN: [id number] DOB: 09/24/1960 Today's Date: 09/06/2022   History of Present Illness Erin Hobbs  is a 62 y.o. female, with medical history significant for COPD, chronic hypoxic respiratory failure on 2.5 L oxygen, and tobacco abuse (reports she quit 4 days ago).  -Presents to ED secondary to complaints of cough, wheezing and shortness of breath patient reports symptoms has been going on for last 3 weeks, reports she has been using nebulizer at home without much improvement, actually he thinks it caused her symptoms to worsen and, she is on 2.5 L oxygen, but despite that remains she has been short of breath, coughing, nonproductive, denies chest pain, hemoptysis, fever or chills.   Clinical Impression   Pt agreaable to OT evaluation and participated well in session. Pt reports prior to hospitalization she was independent in ADLS/IADSL as well as driving. Pt lives at home with her son and ex husband who are available to provide 24/7 assist if needed. Pt completed bed mobility with mod independence with HOB raised. She completed sit to stand t/f and toilet t/f with independence. She completed functional mobility around room and in hallway independently. Maintained O2 sats at 94-96% on 3L of O2 throughout session. Pt reports she feels back to baseline prior to hospital admission. Pt does not require any further OT services and is safe to d/c home.       Recommendations for follow up therapy are one component of a multi-disciplinary discharge planning process, led by the attending physician.  Recommendations may be updated based on patient status, additional functional criteria and insurance authorization.   Assistance Recommended at Discharge PRN  Patient can return home with the following Assistance with cooking/housework    Functional Status Assessment  Patient has had a recent decline in their functional status and  demonstrates the ability to make significant improvements in function in a reasonable and predictable amount of time.  Equipment Recommendations  None recommended by OT           Precautions / Restrictions Precautions Precautions: Fall Restrictions Weight Bearing Restrictions: No      Mobility Bed Mobility Overal bed mobility: Modified Independent             General bed mobility comments: Raised HOB    Transfers Overall transfer level: Independent Equipment used: None                      Balance Overall balance assessment: Independent Sitting-balance support: Feet supported, No upper extremity supported Sitting balance-Leahy Scale: Good Sitting balance - Comments: seated at EOB   Standing balance support: During functional activity, No upper extremity supported Standing balance-Leahy Scale: Good Standing balance comment: good                           ADL either performed or assessed with clinical judgement   ADL Overall ADL's : Independent                                       General ADL Comments: Pt donned pants and socks independently. Reports she completes all ADLS independently prior to hospitalization     Vision Baseline Vision/History: 1 Wears glasses Ability to See in Adequate Light: 0 Adequate Patient Visual Report: No change from baseline Vision Assessment?: No apparent visual deficits  Pertinent Vitals/Pain Pain Assessment Pain Assessment: 0-10 Pain Score: 3  Pain Location: R abdomen Pain Descriptors / Indicators: Discomfort Pain Intervention(s): Monitored during session     Hand Dominance Right   Extremity/Trunk Assessment Upper Extremity Assessment Upper Extremity Assessment: Overall WFL for tasks assessed   Lower Extremity Assessment Lower Extremity Assessment: Defer to PT evaluation   Cervical / Trunk Assessment Cervical / Trunk Assessment: Normal   Communication  Communication Communication: No difficulties   Cognition Arousal/Alertness: Awake/alert Behavior During Therapy: WFL for tasks assessed/performed Overall Cognitive Status: Within Functional Limits for tasks assessed                                                        Home Living Family/patient expects to be discharged to:: Private residence Living Arrangements: Children;Other relatives Available Help at Discharge: Family;Available 24 hours/day Type of Home: House Home Access: Stairs to enter [employer] of Steps: 3 Entrance Stairs-Rails: None Home Layout: Two level;Able to live on main level with bedroom/bathroom     Bathroom Shower/Tub: Occupational psychologist: Standard Bathroom Accessibility: Yes   Home Equipment: None          Prior Functioning/Environment Prior Level of Function : Independent/Modified Independent             Mobility Comments: Household and short distanced community ambulator without AD, on 2.5 LPM home O2 constant ADLs Comments: Independent                      OT Goals(Current goals can be found in the care plan section) Acute Rehab OT Goals Patient Stated Goal: to return home  OT Frequency:                    AM-PAC OT "6 Clicks" Daily Activity     Outcome Measure Help from another person eating meals?: None Help from another person taking care of personal grooming?: None Help from another person toileting, which includes using toliet, bedpan, or urinal?: None Help from another person bathing (including washing, rinsing, drying)?: None Help from another person to put on and taking off regular upper body clothing?: None Help from another person to put on and taking off regular lower body clothing?: None 6 Click Score: 24   End of Session Equipment Utilized During Treatment: Oxygen  Activity Tolerance: Patient tolerated treatment well Patient left: in bed;with call bell/phone  within reach  OT Visit Diagnosis: Muscle weakness (generalized) (M62.81)                Time: [id number] OT Time Calculation (min): 10 min Charges:  OT General Charges $OT Visit: 1 Visit OT Evaluation $OT Eval Low Complexity: 1 Low  Frederic Jericho, OTR/L  09/06/2022, 8:56 AM

## 2022-09-06 NOTE — Plan of Care (Signed)
  Problem: Education: Goal: Knowledge of disease or condition will improve Outcome: Progressing   

## 2022-09-06 NOTE — Progress Notes (Signed)
PT Cancellation Note  Patient Details Name: Erin Hobbs MRN: [id number] DOB: June 26, 1960   Cancelled Treatment:    Reason Eval/Treat Not Completed: PT screened, no needs identified, will sign off;Other (comment) (PT evaluation has been completed); New order received for PT evaluation, PT evaluation has been completed yesterday (09/05/22) and patient has been recommended for No PT follow up due to functioning near baseline for mobility and is independent/modified independent with all mobility. OT evaluated today reporting the same.  2:36 PM, 09/06/22 Mearl Latin PT, DPT Physical Therapist at [facility]

## 2022-09-07 DIAGNOSIS — J441 Chronic obstructive pulmonary disease with (acute) exacerbation: Secondary | ICD-10-CM | POA: Diagnosis not present

## 2022-09-07 DIAGNOSIS — J189 Pneumonia, unspecified organism: Secondary | ICD-10-CM | POA: Diagnosis not present

## 2022-09-07 DIAGNOSIS — K219 Gastro-esophageal reflux disease without esophagitis: Secondary | ICD-10-CM | POA: Diagnosis not present

## 2022-09-07 LAB — CBC WITH DIFFERENTIAL/PLATELET
Abs Immature Granulocytes: 0.76 10*3/uL — ABNORMAL HIGH (ref 0.00–0.07)
Basophils Absolute: 0.1 10*3/uL (ref 0.0–0.1)
Basophils Relative: 0 %
Eosinophils Absolute: 0 10*3/uL (ref 0.0–0.5)
Eosinophils Relative: 0 %
HCT: 37.8 % (ref 36.0–46.0)
Hemoglobin: 12 g/dL (ref 12.0–15.0)
Immature Granulocytes: 5 %
Lymphocytes Relative: 13 %
Lymphs Abs: 1.9 10*3/uL (ref 0.7–4.0)
MCH: 28.5 pg (ref 26.0–34.0)
MCHC: 31.7 g/dL (ref 30.0–36.0)
MCV: 89.8 fL (ref 80.0–100.0)
Monocytes Absolute: 1.2 10*3/uL — ABNORMAL HIGH (ref 0.1–1.0)
Monocytes Relative: 8 %
Neutro Abs: 10.7 10*3/uL — ABNORMAL HIGH (ref 1.7–7.7)
Neutrophils Relative %: 74 %
Platelets: 281 10*3/uL (ref 150–400)
RBC: 4.21 MIL/uL (ref 3.87–5.11)
RDW: 13.7 % (ref 11.5–15.5)
WBC: 14.6 10*3/uL — ABNORMAL HIGH (ref 4.0–10.5)
nRBC: 0 % (ref 0.0–0.2)

## 2022-09-07 LAB — BASIC METABOLIC PANEL
Anion gap: 7 (ref 5–15)
BUN: 14 mg/dL (ref 8–23)
CO2: 28 mmol/L (ref 22–32)
Calcium: 8 mg/dL — ABNORMAL LOW (ref 8.9–10.3)
Chloride: 104 mmol/L (ref 98–111)
Creatinine, Ser: 0.66 mg/dL (ref 0.44–1.00)
GFR, Estimated: >60 mL/min (ref >60)
Glucose, Bld: 113 mg/dL — ABNORMAL HIGH (ref 70–99)
Potassium: 4 mmol/L (ref 3.5–5.1)
Sodium: 139 mmol/L (ref 135–145)

## 2022-09-07 MED ORDER — BENZONATATE 100 MG PO CAPS: 100.0000 mg | ORAL_CAPSULE | Freq: Two times a day (BID) | ORAL | 0 refills | Status: DC | PRN

## 2022-09-07 MED ORDER — CEFDINIR 300 MG PO CAPS: 300.0000 mg | ORAL_CAPSULE | Freq: Two times a day (BID) | ORAL | 0 refills | Status: AC

## 2022-09-07 MED ORDER — PREDNISONE 20 MG PO TABS: 20.0000 mg | ORAL_TABLET | Freq: Every day | ORAL | 0 refills | Status: AC

## 2022-09-07 MED ORDER — AZITHROMYCIN 500 MG PO TABS: 500.0000 mg | ORAL_TABLET | Freq: Every day | ORAL | 0 refills | Status: AC

## 2022-09-07 MED ORDER — CEFDINIR 300 MG PO CAPS
300.0000 mg | ORAL_CAPSULE | Freq: Two times a day (BID) | ORAL | Status: DC
  Administered 2022-09-07: 300 mg via ORAL
  Filled 2022-09-07: qty 1

## 2022-09-07 MED ORDER — PREDNISONE 20 MG PO TABS: 20.0000 mg | ORAL_TABLET | Freq: Every day | ORAL | Status: DC

## 2022-09-07 NOTE — Plan of Care (Signed)
°  Problem: Education: °Goal: Knowledge of disease or condition will improve °Outcome: Adequate for Discharge °Goal: Knowledge of the prescribed therapeutic regimen will improve °Outcome: Adequate for Discharge °Goal: Individualized Educational Video(s) °Outcome: Adequate for Discharge °  °Problem: Activity: °Goal: Ability to tolerate increased activity will improve °Outcome: Adequate for Discharge °Goal: Will verbalize the importance of balancing activity with adequate rest periods °Outcome: Adequate for Discharge °  °Problem: Respiratory: °Goal: Ability to maintain a clear airway will improve °Outcome: Adequate for Discharge °Goal: Levels of oxygenation will improve °Outcome: Adequate for Discharge °Goal: Ability to maintain adequate ventilation will improve °Outcome: Adequate for Discharge °  °Problem: Activity: °Goal: Ability to tolerate increased activity will improve °Outcome: Adequate for Discharge °  °Problem: Clinical Measurements: °Goal: Ability to maintain a body temperature in the normal range will improve °Outcome: Adequate for Discharge °  °Problem: Respiratory: °Goal: Ability to maintain adequate ventilation will improve °Outcome: Adequate for Discharge °Goal: Ability to maintain a clear airway will improve °Outcome: Adequate for Discharge °  °Problem: Education: °Goal: Knowledge of General Education information will improve °Description: Including pain rating scale, medication(s)/side effects and non-pharmacologic comfort measures °Outcome: Adequate for Discharge °  °Problem: Health Behavior/Discharge Planning: °Goal: Ability to manage health-related needs will improve °Outcome: Adequate for Discharge °  °Problem: Clinical Measurements: °Goal: Ability to maintain clinical measurements within normal limits will improve °Outcome: Adequate for Discharge °Goal: Will remain free from infection °Outcome: Adequate for Discharge °Goal: Diagnostic test results will improve °Outcome: Adequate for  Discharge °Goal: Respiratory complications will improve °Outcome: Adequate for Discharge °Goal: Cardiovascular complication will be avoided °Outcome: Adequate for Discharge °  °Problem: Activity: °Goal: Risk for activity intolerance will decrease °Outcome: Adequate for Discharge °  °Problem: Nutrition: °Goal: Adequate nutrition will be maintained °Outcome: Adequate for Discharge °  °Problem: Coping: °Goal: Level of anxiety will decrease °Outcome: Adequate for Discharge °  °Problem: Elimination: °Goal: Will not experience complications related to bowel motility °Outcome: Adequate for Discharge °Goal: Will not experience complications related to urinary retention °Outcome: Adequate for Discharge °  °Problem: Pain Managment: °Goal: General experience of comfort will improve °Outcome: Adequate for Discharge °  °Problem: Safety: °Goal: Ability to remain free from injury will improve °Outcome: Adequate for Discharge °  °Problem: Skin Integrity: °Goal: Risk for impaired skin integrity will decrease °Outcome: Adequate for Discharge °  °

## 2022-09-07 NOTE — TOC Transition Note (Addendum)
Transition of Care [facility]) - CM/SW Discharge Note   Patient Details  Name: Erin Hobbs MRN: [id number] Date of Birth: Aug 30, 1960  Transition of Care [facility]) CM/SW Contact:  Iona Beard, [place] Phone Number: 09/07/2022, 11:43 AM   Clinical Narrative:    CSW updated [place] with [employer] that pt was discharging home, they are getting pts home set up ready. Pt has portable O2 already set up. [employer] will deliver neb machine to room for pt. TOC signing off.   Final next level of care: Home/Self Care Barriers to Discharge: Barriers Resolved   Patient Goals and CMS Choice CMS Medicare.gov Compare Post Acute Care list provided to:: Patient Choice offered to / list presented to : Patient  Discharge Placement                         Discharge Plan and Services Additional resources added to the After Visit Summary for                    DME Agency: [facility] Date DME Agency Contacted: 09/05/22 Time DME Agency Contacted: [id number] Representative spoke with at DME Agency: [place] ([place]) Interventions SDOH Screenings   Food Insecurity: No Food Insecurity (09/04/2022)  Housing: Low Risk  (09/04/2022)  Transportation Needs: No Transportation Needs (09/04/2022)  Utilities: Not At Risk (09/04/2022)  Tobacco Use: High Risk (09/04/2022)     Readmission Risk Interventions    09/05/2022    1:26 PM  Readmission Risk Prevention Plan  Post Dischage Appt Not Complete  Medication Screening Complete  Transportation Screening Complete

## 2022-09-07 NOTE — Discharge Summary (Signed)
Physician Discharge Summary   Patient: Erin Hobbs MRN: [id number] DOB: 1961-03-11  Admit date:     09/04/2022  Discharge date: 09/07/22  Discharge Physician: Erin Hobbs   PCP: Erin Hess., MD   Recommendations at discharge:    Patient will continue antibiotic therapy with cefdinir and azithromycin for 6 more days. Continue with prednisone 20 mg daily for 3 more days. Continue bronchodilator therapy and airway clearing techniques at home.  Follow up with Dr Erin Hobbs in 7 to 10 days.    Discharge Diagnoses: Principal Problem:   Right lower lobe pneumonia Active Problems:   COPD with acute exacerbation (HCC)   Gastroesophageal reflux disease   Class 1 obesity   Pneumonia  Resolved Problems:   * No resolved hospital problems. [facility] Course: [name] was admitted to the hospital with the working diagnosis of community acquired pneumonia.   62 yo female with the past medical history of COPD, chronic hypoxemic respiratory failure and tobacco abuse who presented with cough, wheezing and dyspnea for the last 3 weeks. Symptoms refractive to bronchodilator use at home. On her initial physical examination her 02 saturation was 85% on 2.5 L/min per [place], blood pressure 124/70, HR 111. RR 19, lungs with rales and wheezing, heart with S1 and S2 present and rhythmic with no murmurs, or gallops, abdomen with no distention and no lower extremity edema.  NA 132, K 3,6 CL 99, bicarbonate 24 glucose 167, bun 9 cr 0,75  Lactic acid 1,1 Wbc 15,4 hgb 13,3 plt 219  Sars covid 19 negative   Chest radiograph with hyperinflated lungs, right lower lobe infiltrate, with no effusions.   Patient was placed on IV antibiotic therapy.    03/29 clinically improving but not yet back to baseline.  03/30 patient clinically improving, continue medical therapy at home. Follow up with primary care in 7 to 10 days.   Assessment and Plan: * Right lower lobe pneumonia Positive  pleuritic chest pain on the right side.   Patient was placed on antibiotic therapy with ceftriaxone IV and oral azithromycin.  Her symptoms improved, she has remained afebrile and leukocytosis is improving. Cultures so far with no growth.   Sepsis has been ruled out.  COPD with acute exacerbation ([place]) Acute on chronic hypoxemic respiratory failure.   Patient was treated with aggressive bronchodilator therapy, systemic and inhaled corticosteroids. Airway clearing techniques with flutter valve and incentive spirometer.  Antitussive agents.   Patient had significant improvement in her symptoms, by the time of her discharge she had no wheezing or rhonchi, her 02 saturation was 98% on 2 L/min per .  Plan to continue supplemental 02 at home.  Will give prescription for new nebulizer machine, that one she has is 62 years old and she is missing the tubing.  Smoking cessation and advices to stay up to date with immunizations for COVID 19 and influenza.     Gastroesophageal reflux disease Continue with pantoprazole.   Class 1 obesity Calculated BMI is 30,2          Consultants: none  Procedures performed: none   Disposition: Home Diet recommendation:  Discharge Diet Orders (From admission, onward)     Start     Ordered   09/07/22 0000  Diet - low sodium heart healthy        09/07/22 1102           Regular diet DISCHARGE MEDICATION: Allergies as of 09/07/2022  Reactions   Aspirin Other (See Comments)   Cannot take due to stomach ulcer   Mushroom Extract Complex Other (See Comments)   Dizziness         Medication List     STOP taking these medications    HYDROcodone-acetaminophen 10-325 MG tablet Commonly known as: NORCO   ondansetron 4 MG disintegrating tablet Commonly known as: Zofran ODT       TAKE these medications    acetaminophen 500 MG tablet Commonly known as: TYLENOL Take 1,000 mg by mouth every 6 (six) hours as needed for fever.    ALPRAZolam 0.5 MG tablet Commonly known as: XANAX Take 1 tablet (0.5 mg total) by mouth daily. Do not take close to bedtime for safety reasons.   amphetamine-dextroamphetamine 10 MG tablet Commonly known as: ADDERALL Take 10 mg by mouth every other day.   azithromycin 500 MG tablet Commonly known as: ZITHROMAX Take 1 tablet (500 mg total) by mouth daily for 6 days.   benzonatate 100 MG capsule Commonly known as: TESSALON Take 1 capsule (100 mg total) by mouth 2 (two) times daily as needed for cough.   budesonide-formoterol 160-4.5 MCG/ACT inhaler Commonly known as: SYMBICORT Inhale 2 puffs into the lungs 2 (two) times daily.   cefdinir 300 MG capsule Commonly known as: OMNICEF Take 1 capsule (300 mg total) by mouth every 12 (twelve) hours for 6 days.   ipratropium-albuterol 0.5-2.5 (3) MG/3ML Soln Commonly known as: DUONEB Take 3 mLs by nebulization every 6 (six) hours as needed (SOB/wheezing not improved with the use of inhaler.).   Ipratropium-Albuterol 20-100 MCG/ACT Aers respimat Commonly known as: COMBIVENT Inhale 1 puff into the lungs every 6 (six) hours.   nicotine 21 mg/24hr patch Commonly known as: NICODERM CQ - dosed in mg/24 hours Place 1 patch (21 mg total) onto the skin daily.   omeprazole 40 MG capsule Commonly known as: PRILOSEC Take 40 mg by mouth daily.   predniSONE 20 MG tablet Commonly known as: DELTASONE Take 1 tablet (20 mg total) by mouth daily with breakfast for 3 days. Start taking on: September 08, 2022   Ventolin HFA 108 (90 Base) MCG/ACT inhaler Generic drug: albuterol Inhale 2 puffs into the lungs every 4 (four) hours as needed for wheezing or shortness of breath.   Vitamin D (Ergocalciferol) 1.25 MG (50000 UNIT) Caps capsule Commonly known as: DRISDOL Take 50,000 Units by mouth every Monday.               Durable Medical Equipment  (From admission, onward)           Start     Ordered   09/07/22 0000  For home use only DME  Nebulizer machine       Question Answer Comment  Patient needs a nebulizer to treat with the following condition COPD exacerbation   Length of Need 12 Months      09/07/22 1103   09/05/22 1220  For home use only DME [place]  Once        09/05/22 [place], [facility] Follow up.   Why: Home Oxygen - patient need a Large Concentrator Contact information: [address] [place] [address] [phone]                Discharge Exam: Filed Weights   09/04/22 1428 09/04/22 1502  Weight: 79.8 kg 79.8 kg  BP (!) 153/80 (BP Location: Left Arm)   Pulse 84   Temp 97.8 F (36.6 C) (Oral)   Resp 18   Ht 5\' 4"  (1.626 m)   Wt 79.8 kg   SpO2 98%   BMI 30.20 kg/m   Patient is feeling well, improved pleuritic chest pain and dyspnea, tolerating po well. She has been ambulating and getting out of the bed.  Neurology awake and Hobbs ENT with no pallor or icterus Cardiovascular with S1 and S2 present and rhythmic with no gallops, rubs or murmurs Respiratory with prolonged expiratory phase with no wheezing, rales or rhonchi Abdomen with no distention  No lower extremity edema   Condition at discharge: stable  The results of significant diagnostics from this hospitalization (including imaging, microbiology, ancillary and laboratory) are listed below for reference.   Imaging Studies: DG Chest 2 View  Result Date: 09/04/2022 CLINICAL DATA:  Dizziness and productive cough for 4 days. Short of breath. EXAM: CHEST - 2 VIEW COMPARISON:  09/06/2020. FINDINGS: Coarse interstitial thickening, most evident in the lower lungs, right greater than left, has increased compared to the prior exam. No lung consolidation. No pleural effusion or pneumothorax. Cardiac silhouette normal in size and configuration. Normal mediastinal and hilar contours. Skeletal structures are intact. IMPRESSION: 1. Coarse bilateral interstitial thickening  in the lower lungs, greater on the right, increased from prior chest radiographs. Findings may reflect a component of acute interstitial infection/inflammation superimposed on chronic thickening. No evidence of lobar pneumonia and no pulmonary edema. Electronically Signed   By: Lajean Manes M.D.   On: 09/04/2022 15:32    Microbiology: Results for orders placed or performed during the hospital encounter of 09/04/22  Blood culture (routine x 2)     Status: None (Preliminary result)   Collection Time: 09/04/22  2:37 PM   Specimen: BLOOD  Result Value Ref Range Status   Specimen Description BLOOD RIGHT ARM  Final   Special Requests   Final    BOTTLES DRAWN AEROBIC AND ANAEROBIC Blood Culture adequate volume   Culture   Final    NO GROWTH 3 DAYS Performed at [facility], [address], [place], [place] [address]    Report Status PENDING  Incomplete  Blood culture (routine x 2)     Status: None (Preliminary result)   Collection Time: 09/04/22  2:48 PM   Specimen: BLOOD  Result Value Ref Range Status   Specimen Description BLOOD LEFT HAND  Final   Special Requests   Final    BOTTLES DRAWN AEROBIC AND ANAEROBIC Blood Culture adequate volume   Culture   Final    NO GROWTH 3 DAYS Performed at [facility], [address] [place], [place] [address]    Report Status PENDING  Incomplete  Resp panel by RT-PCR (RSV, Flu A&B, Covid) Anterior Nasal Swab     Status: None   Collection Time: 09/04/22  2:54 PM   Specimen: Anterior Nasal Swab  Result Value Ref Range Status   SARS Coronavirus 2 by RT PCR NEGATIVE NEGATIVE Final    Comment: (NOTE) SARS-CoV-2 target nucleic acids are NOT DETECTED.  The SARS-CoV-2 RNA is generally detectable in upper respiratory specimens during the acute phase of infection. The lowest concentration of SARS-CoV-2 viral copies this assay can detect is 138 copies/mL. A negative result does not preclude SARS-Cov-2 infection and should not be used as the sole basis for  treatment or other patient management decisions. A negative result may occur with  improper specimen  collection/handling, submission of specimen other than nasopharyngeal swab, presence of viral mutation(s) within the areas targeted by this assay, and inadequate number of viral copies(<138 copies/mL). A negative result must be combined with clinical observations, patient history, and epidemiological information. The expected result is Negative.  Fact Sheet for Patients:  EntrepreneurPulse.com.au  Fact Sheet for Healthcare Providers:  IncredibleEmployment.be  This test is no t yet approved or cleared by the [place] FDA and  has been authorized for detection and/or diagnosis of SARS-CoV-2 by FDA under an Emergency Use Authorization (EUA). This EUA will remain  in effect (meaning this test can be used) for the duration of the COVID-19 declaration under Section 564(b)(1) of the Act, 21 U.S.C.section 360bbb-3(b)(1), unless the authorization is terminated  or revoked sooner.       Influenza A by PCR NEGATIVE NEGATIVE Final   Influenza B by PCR NEGATIVE NEGATIVE Final    Comment: (NOTE) The Xpert Xpress SARS-CoV-2/FLU/RSV plus assay is intended as an aid in the diagnosis of influenza from Nasopharyngeal swab specimens and should not be used as a sole basis for treatment. Nasal washings and aspirates are unacceptable for Xpert Xpress SARS-CoV-2/FLU/RSV testing.  Fact Sheet for Patients: EntrepreneurPulse.com.au  Fact Sheet for Healthcare Providers: IncredibleEmployment.be  This test is not yet approved or cleared by the [place] FDA and has been authorized for detection and/or diagnosis of SARS-CoV-2 by FDA under an Emergency Use Authorization (EUA). This EUA will remain in effect (meaning this test can be used) for the duration of the COVID-19 declaration under Section 564(b)(1) of the Act, 21  U.S.C. section 360bbb-3(b)(1), unless the authorization is terminated or revoked.     Resp Syncytial Virus by PCR NEGATIVE NEGATIVE Final    Comment: (NOTE) Fact Sheet for Patients: EntrepreneurPulse.com.au  Fact Sheet for Healthcare Providers: IncredibleEmployment.be  This test is not yet approved or cleared by the [place] FDA and has been authorized for detection and/or diagnosis of SARS-CoV-2 by FDA under an Emergency Use Authorization (EUA). This EUA will remain in effect (meaning this test can be used) for the duration of the COVID-19 declaration under Section 564(b)(1) of the Act, 21 U.S.C. section 360bbb-3(b)(1), unless the authorization is terminated or revoked.  Performed at [facility], [address] [place], [place] [address]     Labs: CBC: Recent Labs  Lab 09/04/22 1438 09/05/22 0437 09/06/22 0504 09/07/22 0608  WBC 15.4* 11.6* 17.8* 14.6*  NEUTROABS 11.9*  --   --  10.7*  HGB 13.3 11.4* 11.9* 12.0  HCT 41.0 35.2* 37.5 37.8  MCV 88.0 88.0 89.9 89.8  PLT 219 219 274 [id number]   Basic Metabolic Panel: Recent Labs  Lab 09/04/22 1438 09/05/22 0437 09/06/22 0504 09/07/22 0608  NA 132* 137 140 139  K 3.6 3.9 4.1 4.0  CL 99 106 107 104  CO2 24 25 25 28   GLUCOSE 167* 199* 136* 113*  BUN 9 7* 10 14  CREATININE 0.75 0.52 0.65 0.66  CALCIUM 8.2* 8.0* 8.3* 8.0*   Liver Function Tests: Recent Labs  Lab 09/04/22 1438  AST 18  ALT 16  ALKPHOS 79  BILITOT 0.8  PROT 7.2  ALBUMIN 3.5   CBG: No results for input(s): "GLUCAP" in the last 168 hours.  Discharge time spent: greater than 30 minutes.  Signed: Tawni Millers, MD [facility] 09/07/2022

## 2022-09-09 LAB — CULTURE, BLOOD (ROUTINE X 2)
Culture: NO GROWTH
Culture: NO GROWTH
Special Requests: ADEQUATE
Special Requests: ADEQUATE

## 2023-06-09 ENCOUNTER — Other Ambulatory Visit: Payer: Self-pay | Admitting: Physician Assistant

## 2023-06-09 DIAGNOSIS — Z780 Asymptomatic menopausal state: Secondary | ICD-10-CM

## 2023-06-23 ENCOUNTER — Other Ambulatory Visit: Payer: Self-pay | Admitting: Physician Assistant

## 2023-06-23 DIAGNOSIS — Z0000 Encounter for general adult medical examination without abnormal findings: Secondary | ICD-10-CM

## 2023-06-25 ENCOUNTER — Inpatient Hospital Stay: Admission: RE | Admit: 2023-06-25 | Payer: Medicaid Other | Source: Ambulatory Visit

## 2023-07-30 ENCOUNTER — Ambulatory Visit: Payer: Medicaid Other

## 2023-08-12 ENCOUNTER — Ambulatory Visit: Payer: Medicaid Other

## 2023-08-16 ENCOUNTER — Encounter (HOSPITAL_COMMUNITY): Payer: Self-pay | Admitting: *Deleted

## 2023-08-16 ENCOUNTER — Emergency Department (HOSPITAL_COMMUNITY)

## 2023-08-16 ENCOUNTER — Other Ambulatory Visit: Payer: Self-pay

## 2023-08-16 ENCOUNTER — Emergency Department (HOSPITAL_COMMUNITY)
Admission: EM | Admit: 2023-08-16 | Discharge: 2023-08-16 | Disposition: A | Discharge status: Home / Self Care | Attending: Emergency Medicine | Admitting: Emergency Medicine

## 2023-08-16 DIAGNOSIS — Z7951 Long term (current) use of inhaled steroids: Secondary | ICD-10-CM | POA: Diagnosis not present

## 2023-08-16 DIAGNOSIS — R059 Cough, unspecified: Secondary | ICD-10-CM | POA: Diagnosis present

## 2023-08-16 DIAGNOSIS — Z720 Tobacco use: Secondary | ICD-10-CM | POA: Diagnosis not present

## 2023-08-16 DIAGNOSIS — J441 Chronic obstructive pulmonary disease with (acute) exacerbation: Secondary | ICD-10-CM | POA: Diagnosis not present

## 2023-08-16 LAB — COMPREHENSIVE METABOLIC PANEL
ALT: 17 U/L (ref 0–44)
AST: 16 U/L (ref 15–41)
Albumin: 3.9 g/dL (ref 3.5–5.0)
Alkaline Phosphatase: 60 U/L (ref 38–126)
Anion gap: 10 (ref 5–15)
BUN: 15 mg/dL (ref 8–23)
CO2: 24 mmol/L (ref 22–32)
Calcium: 8.8 mg/dL — ABNORMAL LOW (ref 8.9–10.3)
Chloride: 99 mmol/L (ref 98–111)
Creatinine, Ser: 0.82 mg/dL (ref 0.44–1.00)
GFR, Estimated: >60 mL/min (ref >60)
Glucose, Bld: 103 mg/dL — ABNORMAL HIGH (ref 70–99)
Potassium: 4.2 mmol/L (ref 3.5–5.1)
Sodium: 133 mmol/L — ABNORMAL LOW (ref 135–145)
Total Bilirubin: 0.9 mg/dL (ref 0.0–1.2)
Total Protein: 6.8 g/dL (ref 6.5–8.1)

## 2023-08-16 LAB — BLOOD GAS, VENOUS
Acid-Base Excess: 2.6 mmol/L — ABNORMAL HIGH (ref 0.0–2.0)
Bicarbonate: 27.2 mmol/L (ref 20.0–28.0)
Drawn by: 27160
O2 Saturation: 92.4 %
Patient temperature: 37.2
pCO2, Ven: 41 mmHg — ABNORMAL LOW (ref 44–60)
pH, Ven: 7.43 (ref 7.25–7.43)
pO2, Ven: 61 mmHg — ABNORMAL HIGH (ref 32–45)

## 2023-08-16 LAB — RESP PANEL BY RT-PCR (RSV, FLU A&B, COVID)  RVPGX2
Influenza A by PCR: NEGATIVE
Influenza B by PCR: NEGATIVE
Resp Syncytial Virus by PCR: NEGATIVE
SARS Coronavirus 2 by RT PCR: NEGATIVE

## 2023-08-16 LAB — CBC
HCT: 42.8 % (ref 36.0–46.0)
Hemoglobin: 14 g/dL (ref 12.0–15.0)
MCH: 28.6 pg (ref 26.0–34.0)
MCHC: 32.7 g/dL (ref 30.0–36.0)
MCV: 87.3 fL (ref 80.0–100.0)
Platelets: 219 10*3/uL (ref 150–400)
RBC: 4.9 MIL/uL (ref 3.87–5.11)
RDW: 13.8 % (ref 11.5–15.5)
WBC: 17 10*3/uL — ABNORMAL HIGH (ref 4.0–10.5)
nRBC: 0 % (ref 0.0–0.2)

## 2023-08-16 LAB — LACTIC ACID, PLASMA
Lactic Acid, Venous: 0.7 mmol/L (ref 0.5–1.9)
Lactic Acid, Venous: 0.6 mmol/L (ref 0.5–1.9)

## 2023-08-16 MED ORDER — BENZONATATE 100 MG PO CAPS: 100.0000 mg | ORAL_CAPSULE | Freq: Two times a day (BID) | ORAL | 0 refills | Status: AC | PRN

## 2023-08-16 MED ORDER — METHYLPREDNISOLONE SODIUM SUCC 125 MG IJ SOLR
125.0000 mg | Freq: Once | INTRAMUSCULAR | Status: AC
  Administered 2023-08-16: 125 mg via INTRAVENOUS
  Filled 2023-08-16: qty 2

## 2023-08-16 MED ORDER — AZITHROMYCIN 250 MG PO TABS: ORAL_TABLET | ORAL | 0 refills | Status: DC

## 2023-08-16 MED ORDER — IPRATROPIUM-ALBUTEROL 0.5-2.5 (3) MG/3ML IN SOLN
3.0000 mL | RESPIRATORY_TRACT | Status: AC
  Administered 2023-08-16 (×3): 3 mL via RESPIRATORY_TRACT
  Filled 2023-08-16: qty 9

## 2023-08-16 MED ORDER — PREDNISONE 20 MG PO TABS: 40.0000 mg | ORAL_TABLET | Freq: Every day | ORAL | 0 refills | Status: AC

## 2023-08-16 NOTE — Discharge Instructions (Signed)
 You were seen in the urgency department today for cough and shortness of breath.  Overall your blood work and chest x-ray was reassuring.  I have sent a course of steroids and antibiotics to your pharmacy.  There is a pulmonology office here in [place] with [facility].  I recommend giving them a call on Monday to request an ER follow-up appointment and to transition care there.  Continue to monitor how you're doing and return to the ER for new or worsening symptoms.

## 2023-08-16 NOTE — ED Triage Notes (Signed)
 Pt c/o feeling sob and numbness to both hands that started x 3 days ago  Pt states she is coughing up green sputum and feeling sob with some chest pain

## 2023-08-16 NOTE — ED Provider Notes (Signed)
 [place] EMERGENCY DEPARTMENT AT [facility] Provider Note   CSN: [id number] Arrival date & time: 08/16/23  0848     History  Chief Complaint  Patient presents with   Cough    Erin Hobbs is a 63 y.o. female with history of COPD, tobacco use, who presents emergency department complaining of shortness of breath, cough, chest tightness for the past 3 days.  Has also been experiencing numbness to her bilateral outer hands.  She normally wears 2 L oxygen at home, but was noted to be hypoxic at 89% upon arrival, placed on 3 L.  Has been coughing up green sputum, and feeling soreness in her back and her bilateral ribs from coughing.  Does not think she has had a fever.  Feels like she might have pneumonia again.   Cough Associated symptoms: shortness of breath and wheezing        Home Medications Prior to Admission medications   Medication Sig Start Date End Date Taking? Authorizing Provider  azithromycin (ZITHROMAX) 250 MG tablet Take first 2 tablets together, then 1 every day until finished. 08/16/23  Yes Adonna Horsley T, PA-C  predniSONE (DELTASONE) 20 MG tablet Take 2 tablets (40 mg total) by mouth daily with breakfast for 5 days. 08/16/23 08/21/23 Yes Brayan Votaw T, PA-C  acetaminophen (TYLENOL) 500 MG tablet Take 1,000 mg by mouth every 6 (six) hours as needed for fever.    [provider]  ALPRAZolam Prudy Feeler) 0.5 MG tablet Take 1 tablet (0.5 mg total) by mouth daily. Do not take close to bedtime for safety reasons. 08/17/22     amphetamine-dextroamphetamine (ADDERALL) 10 MG tablet Take 10 mg by mouth every other day. 02/07/20   [provider]  benzonatate (TESSALON) 100 MG capsule Take 1 capsule (100 mg total) by mouth 2 (two) times daily as needed for cough. 08/16/23   Chrsitopher Wik T, PA-C  budesonide-formoterol (SYMBICORT) 160-4.5 MCG/ACT inhaler Inhale 2 puffs into the lungs 2 (two) times daily.    [provider]   Ipratropium-Albuterol (COMBIVENT) 20-100 MCG/ACT AERS respimat Inhale 1 puff into the lungs every 6 (six) hours. 02/23/20   Vassie Loll, MD  ipratropium-albuterol (DUONEB) 0.5-2.5 (3) MG/3ML SOLN Take 3 mLs by nebulization every 6 (six) hours as needed (SOB/wheezing not improved with the use of inhaler.). 02/23/20   Vassie Loll, MD  nicotine (NICODERM CQ - DOSED IN MG/24 HOURS) 21 mg/24hr patch Place 1 patch (21 mg total) onto the skin daily. 02/24/20   Vassie Loll, MD  omeprazole (PRILOSEC) 40 MG capsule Take 40 mg by mouth daily. 06/02/22   [provider]  VENTOLIN HFA 108 (90 Base) MCG/ACT inhaler Inhale 2 puffs into the lungs every 4 (four) hours as needed for wheezing or shortness of breath. 08/04/22   [provider]  Vitamin D, Ergocalciferol, (DRISDOL) 1.25 MG (50000 UNIT) CAPS capsule Take 50,000 Units by mouth every Monday. 02/07/20   [provider]      Allergies    Aspirin and Mushroom extract complex (obsolete)    Review of Systems   Review of Systems  Respiratory:  Positive for cough, chest tightness, shortness of breath and wheezing.   Neurological:  Positive for numbness.  All other systems reviewed and are negative.   Physical Exam Updated Vital Signs BP 112/68   Pulse 87   Temp 98.9 F (37.2 C) (Oral)   Resp 15   Ht 5\' 4"  (1.626 m)   Wt 82.6 kg  SpO2 95%   BMI 31.24 kg/m  Physical Exam Vitals and nursing note reviewed.  Constitutional:      Appearance: Normal appearance.  HENT:     Head: Normocephalic and atraumatic.  Eyes:     Conjunctiva/sclera: Conjunctivae normal.  Cardiovascular:     Rate and Rhythm: Normal rate and regular rhythm.  Pulmonary:     Effort: Tachypnea present. No respiratory distress.     Breath sounds: Wheezing present.     Comments: Expiratory wheezing in all lung fields Abdominal:     General: There is no distension.     Palpations: Abdomen is soft.     Tenderness: There is no abdominal  tenderness.  Skin:    General: Skin is warm and dry.  Neurological:     General: No focal deficit present.     Mental Status: She is alert.     Comments: Reports decreased sensation in the bilateral hands, in ulnar nerve distribution, normal ROM and strength     ED Results / Procedures / Treatments   Labs (all labs ordered are listed, but only abnormal results are displayed) Labs Reviewed  CBC - Abnormal; Notable for the following components:      Result Value   WBC 17.0 (*)    All other components within normal limits  COMPREHENSIVE METABOLIC PANEL - Abnormal; Notable for the following components:   Sodium 133 (*)    Glucose, Bld 103 (*)    Calcium 8.8 (*)    All other components within normal limits  BLOOD GAS, VENOUS - Abnormal; Notable for the following components:   pCO2, Ven 41 (*)    pO2, Ven 61 (*)    Acid-Base Excess 2.6 (*)    All other components within normal limits  RESP PANEL BY RT-PCR (RSV, FLU A&B, COVID)  RVPGX2  CULTURE, BLOOD (ROUTINE X 2)  CULTURE, BLOOD (ROUTINE X 2)  LACTIC ACID, PLASMA  LACTIC ACID, PLASMA    EKG EKG Interpretation Date/Time:  Saturday August 16 2023 11:18:29 EST Ventricular Rate:  101 PR Interval:  136 QRS Duration:  94 QT Interval:  334 QTC Calculation: 433 R Axis:   78  Text Interpretation: Sinus tachycardia Low voltage, precordial leads No significant change since prior 9/21 Confirmed by Meridee Score [phone]) on 08/16/2023 11:22:02 AM  Radiology DG Chest Portable 1 View Result Date: 08/16/2023 CLINICAL DATA:  Cough and shortness of breath EXAM: PORTABLE CHEST 1 VIEW COMPARISON:  X-ray 09/04/2022 FINDINGS: Hyperinflation. No consolidation, pneumothorax or effusion. Chronic lung changes are again seen. Normal cardiopericardial silhouette. Overlapping cardiac leads. No edema. Eventration of the right hemidiaphragm. IMPRESSION: Hyperinflation.  Chronic lung changes. Electronically Signed   By: Karen Kays M.D.   On: 08/16/2023  10:05    Procedures Procedures    Medications Ordered in ED Medications  methylPREDNISolone sodium succinate (SOLU-MEDROL) 125 mg/2 mL injection 125 mg (125 mg Intravenous Given 08/16/23 1017)  ipratropium-albuterol (DUONEB) 0.5-2.5 (3) MG/3ML nebulizer solution 3 mL (3 mLs Nebulization Given 08/16/23 1022)    ED Course/ Medical Decision Making/ A&P                                 Medical Decision Making Amount and/or Complexity of Data Reviewed Labs: ordered. Radiology: ordered.  Risk Prescription drug management.  This patient is a 63 y.o. female  who presents to the ED for concern of shortness of breath x 3 days.  Differential diagnoses prior to evaluation: The emergent differential diagnosis includes, but is not limited to,  CHF, pericardial effusion/tamponade, arrhythmias, ACS, COPD, asthma, bronchitis, pneumonia, pneumothorax, PE, anemia. This is not an exhaustive differential.   Past Medical History / Co-morbidities / Social History: COPD, tobacco use  Additional history: Chart reviewed. Pertinent results include: Most recent ER visit for similar symptoms back in March 2024, required hospitalization  Physical Exam: Physical exam performed. The pertinent findings include: Tachycardic and tachypneic, saturating well at increased 3 L nasal cannula.  Expiratory wheezing in all lung fields, with increased respiratory effort.  Lab Tests/Imaging studies: I personally interpreted labs/imaging and the pertinent results include: WBC 17, CMP grossly unremarkable.  VBG with pCO2 41, normal pH.  Normal lactic acid.  Respiratory panel negative.  Blood cultures pending.  Chest x-ray with no acute findings.. I agree with the radiologist interpretation.  Cardiac monitoring: EKG obtained and interpreted by myself and attending physician which shows: Sinus tachycardia with rate of 101 bpm   Medications: I ordered medication including Solu-Medrol, 3 DuoNebs.  I have reviewed the  patients home medicines and have made adjustments as needed.  On reevaluation patient feels much improved.  She is maintaining her baseline oxygen saturations.  Wheezing has improved significantly on my exam.   Disposition: After consideration of the diagnostic results and the patients response to treatment, I feel that emergency department workup does not suggest an emergent condition requiring admission or immediate intervention beyond what has been performed at this time. The plan is: Discharge to home.  Patient responded very well to treatment in the ER.  She feels ready to go home.  Will send course of azithromycin, steroids, and Tessalon Perles.  She is requesting to establish with a different pulmonologist that is closer to her house, given follow-up information for [facility] pulmonology in [place]. The patient is safe for discharge and has been instructed to return immediately for worsening symptoms, change in symptoms or any other concerns.  Final Clinical Impression(s) / ED Diagnoses Final diagnoses:  COPD exacerbation (HCC)    Rx / DC Orders ED Discharge Orders          Ordered    predniSONE (DELTASONE) 20 MG tablet  Daily with breakfast        08/16/23 1153    azithromycin (ZITHROMAX) 250 MG tablet        08/16/23 1153    benzonatate (TESSALON) 100 MG capsule  2 times daily PRN        08/16/23 1154           Portions of this report may have been transcribed using voice recognition software. Every effort was made to ensure accuracy; however, inadvertent computerized transcription errors may be present.    Jeanella Flattery 08/16/23 1155    Terrilee Files, MD 08/16/23 1753

## 2023-08-21 LAB — CULTURE, BLOOD (ROUTINE X 2)
Special Requests: ADEQUATE
Special Requests: ADEQUATE

## 2023-09-14 NOTE — Progress Notes (Deleted)
 Erin Hobbs, female    DOB: 1961-03-08    MRN: [id number]   Brief patient profile:  ***  yo*** *** referred to pulmonary clinic in [place]  09/18/2023 by *** for ***   Not prev seen by PCCM     History of Present Illness  09/18/2023  Pulmonary/ 1st office eval/ Sherene Sires / [place] [facility]  No chief complaint on file.    Dyspnea:  *** Cough: *** Sleep: *** SABA use: *** 02: *** LDSCT:***  No obvious day to day or daytime pattern/variability or assoc excess/ purulent sputum or mucus plugs or hemoptysis or cp or chest tightness, subjective wheeze or overt sinus or hb symptoms.    Also denies any obvious fluctuation of symptoms with weather or environmental changes or other aggravating or alleviating factors except as outlined above   No unusual exposure hx or h/o childhood pna/ asthma or knowledge of premature birth.  Current Allergies, Complete Past Medical History, Past Surgical History, Family History, and Social History were reviewed in [employer] record.  ROS  The following are not active complaints unless bolded Hoarseness, sore throat, dysphagia, dental problems, itching, sneezing,  nasal congestion or discharge of excess mucus or purulent secretions, ear ache,   fever, chills, sweats, unintended wt loss or wt gain, classically pleuritic or exertional cp,  orthopnea pnd or arm/hand swelling  or leg swelling, presyncope, palpitations, abdominal pain, anorexia, nausea, vomiting, diarrhea  or change in bowel habits or change in bladder habits, change in stools or change in urine, dysuria, hematuria,  rash, arthralgias, visual complaints, headache, numbness, weakness or ataxia or problems with walking or coordination,  change in mood or  memory.            Outpatient Medications Prior to Visit  Medication Sig Dispense Refill   acetaminophen (TYLENOL) 500 MG tablet Take 1,000 mg by mouth every 6 (six) hours as needed for fever.     ALPRAZolam (XANAX)  0.5 MG tablet Take 1 tablet (0.5 mg total) by mouth daily. Do not take close to bedtime for safety reasons. 30 tablet 0   amphetamine-dextroamphetamine (ADDERALL) 10 MG tablet Take 10 mg by mouth every other day.     azithromycin (ZITHROMAX) 250 MG tablet Take first 2 tablets together, then 1 every day until finished. 6 tablet 0   benzonatate (TESSALON) 100 MG capsule Take 1 capsule (100 mg total) by mouth 2 (two) times daily as needed for cough. 20 capsule 0   budesonide-formoterol (SYMBICORT) 160-4.5 MCG/ACT inhaler Inhale 2 puffs into the lungs 2 (two) times daily.     Ipratropium-Albuterol (COMBIVENT) 20-100 MCG/ACT AERS respimat Inhale 1 puff into the lungs every 6 (six) hours. 4 g 2   ipratropium-albuterol (DUONEB) 0.5-2.5 (3) MG/3ML SOLN Take 3 mLs by nebulization every 6 (six) hours as needed (SOB/wheezing not improved with the use of inhaler.). 360 mL 1   nicotine (NICODERM CQ - DOSED IN MG/24 HOURS) 21 mg/24hr patch Place 1 patch (21 mg total) onto the skin daily. 28 patch 0   omeprazole (PRILOSEC) 40 MG capsule Take 40 mg by mouth daily.     VENTOLIN HFA 108 (90 Base) MCG/ACT inhaler Inhale 2 puffs into the lungs every 4 (four) hours as needed for wheezing or shortness of breath.     Vitamin D, Ergocalciferol, (DRISDOL) 1.25 MG (50000 UNIT) CAPS capsule Take 50,000 Units by mouth every Monday.     No facility-administered medications prior to visit.    Past Medical  History:  Diagnosis Date   Arthritis    COPD (chronic obstructive pulmonary disease) (HCC)    Ulcer       Objective:     There were no vitals taken for this visit.         Assessment   No problem-specific Assessment & Plan notes found for this encounter.     Sandrea Hughs, MD 09/14/2023

## 2023-09-16 ENCOUNTER — Inpatient Hospital Stay (HOSPITAL_COMMUNITY)
Admission: EM | Admit: 2023-09-16 | Discharge: 2023-09-21 | DRG: 280 | Disposition: A | Discharge status: Home / Self Care | Attending: Internal Medicine | Admitting: Internal Medicine

## 2023-09-16 ENCOUNTER — Observation Stay (HOSPITAL_COMMUNITY)

## 2023-09-16 ENCOUNTER — Encounter (HOSPITAL_COMMUNITY): Payer: Self-pay | Admitting: Emergency Medicine

## 2023-09-16 ENCOUNTER — Emergency Department (HOSPITAL_COMMUNITY)

## 2023-09-16 ENCOUNTER — Other Ambulatory Visit: Payer: Self-pay

## 2023-09-16 DIAGNOSIS — E66811 Obesity, class 1: Secondary | ICD-10-CM | POA: Diagnosis present

## 2023-09-16 DIAGNOSIS — Z886 Allergy status to analgesic agent status: Secondary | ICD-10-CM

## 2023-09-16 DIAGNOSIS — Z1152 Encounter for screening for COVID-19: Secondary | ICD-10-CM

## 2023-09-16 DIAGNOSIS — K219 Gastro-esophageal reflux disease without esophagitis: Secondary | ICD-10-CM | POA: Diagnosis present

## 2023-09-16 DIAGNOSIS — K259 Gastric ulcer, unspecified as acute or chronic, without hemorrhage or perforation: Secondary | ICD-10-CM | POA: Diagnosis present

## 2023-09-16 DIAGNOSIS — Z720 Tobacco use: Secondary | ICD-10-CM | POA: Diagnosis present

## 2023-09-16 DIAGNOSIS — E785 Hyperlipidemia, unspecified: Secondary | ICD-10-CM | POA: Diagnosis present

## 2023-09-16 DIAGNOSIS — I110 Hypertensive heart disease with heart failure: Secondary | ICD-10-CM | POA: Diagnosis present

## 2023-09-16 DIAGNOSIS — D72829 Elevated white blood cell count, unspecified: Secondary | ICD-10-CM | POA: Diagnosis not present

## 2023-09-16 DIAGNOSIS — J9621 Acute and chronic respiratory failure with hypoxia: Secondary | ICD-10-CM | POA: Diagnosis present

## 2023-09-16 DIAGNOSIS — R7989 Other specified abnormal findings of blood chemistry: Secondary | ICD-10-CM

## 2023-09-16 DIAGNOSIS — Z79899 Other long term (current) drug therapy: Secondary | ICD-10-CM

## 2023-09-16 DIAGNOSIS — R079 Chest pain, unspecified: Secondary | ICD-10-CM

## 2023-09-16 DIAGNOSIS — I5021 Acute systolic (congestive) heart failure: Secondary | ICD-10-CM | POA: Diagnosis present

## 2023-09-16 DIAGNOSIS — Z635 Disruption of family by separation and divorce: Secondary | ICD-10-CM

## 2023-09-16 DIAGNOSIS — Z9981 Dependence on supplemental oxygen: Secondary | ICD-10-CM

## 2023-09-16 DIAGNOSIS — I2510 Atherosclerotic heart disease of native coronary artery without angina pectoris: Secondary | ICD-10-CM | POA: Diagnosis present

## 2023-09-16 DIAGNOSIS — Z8249 Family history of ischemic heart disease and other diseases of the circulatory system: Secondary | ICD-10-CM

## 2023-09-16 DIAGNOSIS — Z7951 Long term (current) use of inhaled steroids: Secondary | ICD-10-CM

## 2023-09-16 DIAGNOSIS — F17210 Nicotine dependence, cigarettes, uncomplicated: Secondary | ICD-10-CM | POA: Diagnosis present

## 2023-09-16 DIAGNOSIS — Z604 Social exclusion and rejection: Secondary | ICD-10-CM | POA: Diagnosis present

## 2023-09-16 DIAGNOSIS — J9601 Acute respiratory failure with hypoxia: Secondary | ICD-10-CM | POA: Diagnosis not present

## 2023-09-16 DIAGNOSIS — I214 Non-ST elevation (NSTEMI) myocardial infarction: Principal | ICD-10-CM | POA: Diagnosis present

## 2023-09-16 DIAGNOSIS — T380X5A Adverse effect of glucocorticoids and synthetic analogues, initial encounter: Secondary | ICD-10-CM | POA: Diagnosis not present

## 2023-09-16 DIAGNOSIS — J449 Chronic obstructive pulmonary disease, unspecified: Secondary | ICD-10-CM | POA: Diagnosis not present

## 2023-09-16 DIAGNOSIS — Z7982 Long term (current) use of aspirin: Secondary | ICD-10-CM

## 2023-09-16 DIAGNOSIS — Z6831 Body mass index (BMI) 31.0-31.9, adult: Secondary | ICD-10-CM

## 2023-09-16 DIAGNOSIS — Z91018 Allergy to other foods: Secondary | ICD-10-CM

## 2023-09-16 DIAGNOSIS — J441 Chronic obstructive pulmonary disease with (acute) exacerbation: Principal | ICD-10-CM | POA: Diagnosis present

## 2023-09-16 LAB — LIPID PANEL
Cholesterol: 170 mg/dL (ref 0–200)
HDL: 66 mg/dL (ref >40)
LDL Cholesterol: 96 mg/dL (ref 0–99)
Total CHOL/HDL Ratio: 2.6 ratio
Triglycerides: 38 mg/dL (ref <150)
VLDL: 8 mg/dL (ref 0–40)

## 2023-09-16 LAB — ECHOCARDIOGRAM COMPLETE
AR max vel: 2.36 cm2
AV Area VTI: 2.15 cm2
AV Area mean vel: 2.09 cm2
AV Mean grad: 3 mmHg
AV Peak grad: 5.2 mmHg
Ao pk vel: 1.14 m/s
Area-P 1/2: 5.75 cm2
Height: 64 in
MV VTI: 1.94 cm2
S' Lateral: 2.8 cm
Single Plane A4C EF: 55.7 %
Weight: 2928 [oz_av]

## 2023-09-16 LAB — CBC WITH DIFFERENTIAL/PLATELET
Abs Immature Granulocytes: 0.11 10*3/uL — ABNORMAL HIGH (ref 0.00–0.07)
Basophils Absolute: 0.1 10*3/uL (ref 0.0–0.1)
Basophils Relative: 0 %
Eosinophils Absolute: 0.1 10*3/uL (ref 0.0–0.5)
Eosinophils Relative: 1 %
HCT: 44.1 % (ref 36.0–46.0)
Hemoglobin: 13.8 g/dL (ref 12.0–15.0)
Immature Granulocytes: 1 %
Lymphocytes Relative: 22 %
Lymphs Abs: 2.9 10*3/uL (ref 0.7–4.0)
MCH: 28.3 pg (ref 26.0–34.0)
MCHC: 31.3 g/dL (ref 30.0–36.0)
MCV: 90.6 fL (ref 80.0–100.0)
Monocytes Absolute: 1.7 10*3/uL — ABNORMAL HIGH (ref 0.1–1.0)
Monocytes Relative: 12 %
Neutro Abs: 8.5 10*3/uL — ABNORMAL HIGH (ref 1.7–7.7)
Neutrophils Relative %: 64 %
Platelets: 283 10*3/uL (ref 150–400)
RBC: 4.87 MIL/uL (ref 3.87–5.11)
RDW: 14 % (ref 11.5–15.5)
WBC: 13.4 10*3/uL — ABNORMAL HIGH (ref 4.0–10.5)
nRBC: 0 % (ref 0.0–0.2)

## 2023-09-16 LAB — BASIC METABOLIC PANEL WITH GFR
Anion gap: 11 (ref 5–15)
BUN: 18 mg/dL (ref 8–23)
CO2: 26 mmol/L (ref 22–32)
Calcium: 8.6 mg/dL — ABNORMAL LOW (ref 8.9–10.3)
Chloride: 103 mmol/L (ref 98–111)
Creatinine, Ser: 0.79 mg/dL (ref 0.44–1.00)
GFR, Estimated: >60 mL/min (ref >60)
Glucose, Bld: 110 mg/dL — ABNORMAL HIGH (ref 70–99)
Potassium: 3.9 mmol/L (ref 3.5–5.1)
Sodium: 140 mmol/L (ref 135–145)

## 2023-09-16 LAB — TROPONIN I (HIGH SENSITIVITY)
Troponin I (High Sensitivity): 400 ng/L (ref <18)
Troponin I (High Sensitivity): 1180 ng/L (ref <18)
Troponin I (High Sensitivity): 1249 ng/L (ref <18)
Troponin I (High Sensitivity): 1333 ng/L (ref <18)
Troponin I (High Sensitivity): 1250 ng/L (ref <18)

## 2023-09-16 LAB — HEPARIN LEVEL (UNFRACTIONATED)
Heparin Unfractionated: 0.26 [IU]/mL — ABNORMAL LOW (ref 0.30–0.70)
Heparin Unfractionated: 0.29 [IU]/mL — ABNORMAL LOW (ref 0.30–0.70)
Heparin Unfractionated: 0.51 [IU]/mL (ref 0.30–0.70)

## 2023-09-16 LAB — HIV ANTIBODY (ROUTINE TESTING W REFLEX): HIV Screen 4th Generation wRfx: NONREACTIVE

## 2023-09-16 LAB — PROCALCITONIN: Procalcitonin: <0.1 ng/mL

## 2023-09-16 LAB — RESP PANEL BY RT-PCR (RSV, FLU A&B, COVID)  RVPGX2
Influenza A by PCR: NEGATIVE
Influenza B by PCR: NEGATIVE
Resp Syncytial Virus by PCR: NEGATIVE
SARS Coronavirus 2 by RT PCR: NEGATIVE

## 2023-09-16 LAB — BRAIN NATRIURETIC PEPTIDE: B Natriuretic Peptide: 227 pg/mL — ABNORMAL HIGH (ref 0.0–100.0)

## 2023-09-16 LAB — HEMOGLOBIN A1C
Hgb A1c MFr Bld: 5.9 % — ABNORMAL HIGH (ref 4.8–5.6)
Mean Plasma Glucose: 122.63 mg/dL

## 2023-09-16 MED ORDER — ALPRAZOLAM 0.25 MG PO TABS
0.5000 mg | ORAL_TABLET | Freq: Every day | ORAL | Status: DC
  Filled 2023-09-16: qty 2

## 2023-09-16 MED ORDER — ONDANSETRON HCL 4 MG/2ML IJ SOLN: 4.0000 mg | Freq: Four times a day (QID) | INTRAMUSCULAR | Status: DC | PRN

## 2023-09-16 MED ORDER — LEVALBUTEROL HCL 0.63 MG/3ML IN NEBU
0.6300 mg | INHALATION_SOLUTION | Freq: Four times a day (QID) | RESPIRATORY_TRACT | Status: DC | PRN
  Administered 2023-09-16 (×2): 0.63 mg via RESPIRATORY_TRACT
  Filled 2023-09-16 (×3): qty 3

## 2023-09-16 MED ORDER — METHYLPREDNISOLONE SODIUM SUCC 40 MG IJ SOLR
40.0000 mg | Freq: Two times a day (BID) | INTRAMUSCULAR | Status: DC
  Administered 2023-09-16: 40 mg via INTRAVENOUS
  Filled 2023-09-16: qty 1

## 2023-09-16 MED ORDER — MAGNESIUM SULFATE 2 GM/50ML IV SOLN
2.0000 g | Freq: Once | INTRAVENOUS | Status: AC
  Administered 2023-09-16: 2 g via INTRAVENOUS
  Filled 2023-09-16: qty 50

## 2023-09-16 MED ORDER — BUDESONIDE 0.25 MG/2ML IN SUSP
0.2500 mg | Freq: Two times a day (BID) | RESPIRATORY_TRACT | Status: DC
  Administered 2023-09-16 – 2023-09-21 (×11): 0.25 mg via RESPIRATORY_TRACT
  Filled 2023-09-16 (×11): qty 2

## 2023-09-16 MED ORDER — METHYLPREDNISOLONE SODIUM SUCC 125 MG IJ SOLR
80.0000 mg | Freq: Two times a day (BID) | INTRAMUSCULAR | Status: DC
  Administered 2023-09-16 – 2023-09-17 (×3): 80 mg via INTRAVENOUS
  Filled 2023-09-16 (×3): qty 2

## 2023-09-16 MED ORDER — ACETAMINOPHEN 650 MG RE SUPP: 650.0000 mg | Freq: Four times a day (QID) | RECTAL | Status: DC | PRN

## 2023-09-16 MED ORDER — PANTOPRAZOLE SODIUM 40 MG IV SOLR
40.0000 mg | INTRAVENOUS | Status: DC
  Administered 2023-09-16 – 2023-09-17 (×2): 40 mg via INTRAVENOUS
  Filled 2023-09-16 (×2): qty 10

## 2023-09-16 MED ORDER — HEPARIN BOLUS VIA INFUSION
4000.0000 [IU] | Freq: Once | INTRAVENOUS | Status: AC
  Administered 2023-09-16: 4000 [IU] via INTRAVENOUS

## 2023-09-16 MED ORDER — HYDROCODONE-ACETAMINOPHEN 10-325 MG PO TABS
1.0000 | ORAL_TABLET | Freq: Two times a day (BID) | ORAL | Status: DC | PRN
  Administered 2023-09-16 – 2023-09-18 (×4): 1 via ORAL
  Filled 2023-09-16 (×4): qty 1

## 2023-09-16 MED ORDER — LEVALBUTEROL HCL 0.63 MG/3ML IN NEBU
0.6300 mg | INHALATION_SOLUTION | Freq: Four times a day (QID) | RESPIRATORY_TRACT | Status: DC
  Administered 2023-09-16 – 2023-09-17 (×5): 0.63 mg via RESPIRATORY_TRACT
  Filled 2023-09-16 (×5): qty 3

## 2023-09-16 MED ORDER — AMPHETAMINE-DEXTROAMPHETAMINE 10 MG PO TABS: 10.0000 mg | ORAL_TABLET | ORAL | Status: DC

## 2023-09-16 MED ORDER — ORAL CARE MOUTH RINSE: 15.0000 mL | OROMUCOSAL | Status: DC | PRN

## 2023-09-16 MED ORDER — PERFLUTREN LIPID MICROSPHERE
1.0000 mL | INTRAVENOUS | Status: AC | PRN
  Administered 2023-09-16: 3 mL via INTRAVENOUS

## 2023-09-16 MED ORDER — HEPARIN (PORCINE) 25000 UT/250ML-% IV SOLN
1100.0000 [IU]/h | INTRAVENOUS | Status: DC
  Administered 2023-09-16: 850 [IU]/h via INTRAVENOUS
  Administered 2023-09-17: 1100 [IU]/h via INTRAVENOUS
  Filled 2023-09-16 (×3): qty 250

## 2023-09-16 MED ORDER — ACETAMINOPHEN 325 MG PO TABS: 650.0000 mg | ORAL_TABLET | Freq: Four times a day (QID) | ORAL | Status: DC | PRN

## 2023-09-16 MED ORDER — ASPIRIN 325 MG PO TABS
325.0000 mg | ORAL_TABLET | Freq: Every day | ORAL | Status: DC
  Filled 2023-09-16: qty 1

## 2023-09-16 MED ORDER — NICOTINE 21 MG/24HR TD PT24
21.0000 mg | MEDICATED_PATCH | Freq: Every day | TRANSDERMAL | Status: DC
Start: 2023-09-16 — End: 2023-09-21
  Administered 2023-09-18 – 2023-09-21 (×4): 21 mg via TRANSDERMAL
  Filled 2023-09-16 (×5): qty 1

## 2023-09-16 MED ORDER — IPRATROPIUM-ALBUTEROL 0.5-2.5 (3) MG/3ML IN SOLN
3.0000 mL | Freq: Once | RESPIRATORY_TRACT | Status: AC
  Administered 2023-09-16: 3 mL via RESPIRATORY_TRACT
  Filled 2023-09-16: qty 3

## 2023-09-16 MED ORDER — NITROGLYCERIN 0.4 MG SL SUBL: 0.4000 mg | SUBLINGUAL_TABLET | SUBLINGUAL | Status: DC | PRN

## 2023-09-16 MED ORDER — HEPARIN BOLUS VIA INFUSION
1000.0000 [IU] | Freq: Once | INTRAVENOUS | Status: AC
  Administered 2023-09-16: 1000 [IU] via INTRAVENOUS
  Filled 2023-09-16: qty 1000

## 2023-09-16 MED ORDER — SODIUM CHLORIDE 0.9 % IV SOLN
500.0000 mg | INTRAVENOUS | Status: DC
  Administered 2023-09-16 – 2023-09-17 (×2): 500 mg via INTRAVENOUS
  Filled 2023-09-16 (×2): qty 5

## 2023-09-16 MED ORDER — ALPRAZOLAM 0.25 MG PO TABS
0.5000 mg | ORAL_TABLET | Freq: Every day | ORAL | Status: DC
  Administered 2023-09-16: 0.5 mg via ORAL
  Filled 2023-09-16: qty 2

## 2023-09-16 MED ORDER — ONDANSETRON HCL 4 MG PO TABS: 4.0000 mg | ORAL_TABLET | Freq: Four times a day (QID) | ORAL | Status: DC | PRN

## 2023-09-16 NOTE — Progress Notes (Signed)
 PHARMACY - ANTICOAGULATION CONSULT NOTE  Pharmacy Consult for IV heparin Indication: chest pain/ACS  Allergies  Allergen Reactions   Aspirin Other (See Comments)    Cannot take due to stomach ulcer   Mushroom Extract Complex (Obsolete) Other (See Comments)    Dizziness    Patient Measurements: Height: 5\' 4"  (162.6 cm) Weight: 83 kg (183 lb) IBW/kg (Calculated) : 54.7 HEPARIN DW (KG): 72.8  Vital Signs: BP: 132/77 (04/08 1420) Pulse Rate: 102 (04/08 1420)  Labs: Recent Labs    09/16/23 0304 09/16/23 0510 09/16/23 0712 09/16/23 0939 09/16/23 1217 09/16/23 1811  HGB 13.8  --   --   --   --   --   HCT 44.1  --   --   --   --   --   PLT 283  --   --   --   --   --   HEPARINUNFRC  --   --   --   --  0.26* 0.29*  CREATININE 0.79  --   --   --   --   --   TROPONINIHS 400*   < > 1,249* 1,333* 1,250*  --    < > = values in this interval not displayed.   Estimated Creatinine Clearance: 76 mL/min (by C-G formula based on SCr of 0.79 mg/dL).  Medical History: Past Medical History:  Diagnosis Date   Arthritis    COPD (chronic obstructive pulmonary disease) (HCC)    Ulcer    Pertinent Medications  No anticoagulation prior to admission on dispense history.   Assessment: Erin Hobbs is a 63 y.o. year old female admitted on 09/16/2023 with concern for ACS. They presented to the ED with shortness of breath and cough over the last 2 days with a baseline of 2L nasal cannula at home. They denied chest pain. Troponins elevated at > 1200. EKG with no significant findings. Pharmacy has been consulted to manage this patient's heparin.  Baseline Labs Hgb 13.4 Plt 283 INR/PT - unknown  Heparin Date/Time HL Clinical Assessment Rate 4/8@1217  0.26 SUBtherapeutic 850 units/hr 4/8@1811  0.29 SUBtherapeutic 1,000 units/hr   Goal of Therapy:  Heparin level 0.3-0.7 units/ml Monitor platelets by anticoagulation protocol: Yes   Plan:  Heparin level is barely SUBtherapeutic; Give 1000  units heparin bolus x 1  Increase heparin to 1100 units/hr Recheck heparin level in 6 hours Follow for possible cath once respiratory status improves Heparin started on 4/8 ~0600  Monitor CBC and platelets daily while on heparin  Thank you for allowing pharmacy to participate in this patient's care.  Effie Shy, PharmD Pharmacy Resident  09/16/2023 6:50 PM

## 2023-09-16 NOTE — ED Notes (Addendum)
 Numerous attempts made to contact receiving nurse. 300 secretary also called in attempt to reach the MS nurse or unit.  AC also contacted ED charge notified

## 2023-09-16 NOTE — Plan of Care (Signed)

## 2023-09-16 NOTE — ED Provider Notes (Signed)
 [place] EMERGENCY DEPARTMENT AT [facility] Provider Note   CSN: [id number] Arrival date & time: 09/16/23  0303     History  Chief Complaint  Patient presents with   Shortness of Breath   Chest Pain    Erin Hobbs is a 63 y.o. female.  Patient is a 63 year old female with past medical history of COPD on home oxygen, GERD.  Patient presented today with complaints of shortness of breath.  This started yesterday and is worsening.  Upon EMS arrival, patient was in moderate respiratory distress and has received 2 breathing treatments, steroids, and magnesium in the ambulance.  Patient describes some cough.  She denies to me that she is having any chest pain or leg swelling.       Home Medications Prior to Admission medications   Medication Sig Start Date End Date Taking? Authorizing Provider  acetaminophen (TYLENOL) 500 MG tablet Take 1,000 mg by mouth every 6 (six) hours as needed for fever.    [provider]  ALPRAZolam Prudy Feeler) 0.5 MG tablet Take 1 tablet (0.5 mg total) by mouth daily. Do not take close to bedtime for safety reasons. 08/17/22     amphetamine-dextroamphetamine (ADDERALL) 10 MG tablet Take 10 mg by mouth every other day. 02/07/20   [provider]  azithromycin (ZITHROMAX) 250 MG tablet Take first 2 tablets together, then 1 every day until finished. 08/16/23   Roemhildt, Lorin T, PA-C  benzonatate (TESSALON) 100 MG capsule Take 1 capsule (100 mg total) by mouth 2 (two) times daily as needed for cough. 08/16/23   Roemhildt, Lorin T, PA-C  budesonide-formoterol (SYMBICORT) 160-4.5 MCG/ACT inhaler Inhale 2 puffs into the lungs 2 (two) times daily.    [provider]  Ipratropium-Albuterol (COMBIVENT) 20-100 MCG/ACT AERS respimat Inhale 1 puff into the lungs every 6 (six) hours. 02/23/20   Vassie Loll, MD  ipratropium-albuterol (DUONEB) 0.5-2.5 (3) MG/3ML SOLN Take 3 mLs by nebulization every 6 (six) hours as needed (SOB/wheezing not  improved with the use of inhaler.). 02/23/20   Vassie Loll, MD  nicotine (NICODERM CQ - DOSED IN MG/24 HOURS) 21 mg/24hr patch Place 1 patch (21 mg total) onto the skin daily. 02/24/20   Vassie Loll, MD  omeprazole (PRILOSEC) 40 MG capsule Take 40 mg by mouth daily. 06/02/22   [provider]  VENTOLIN HFA 108 (90 Base) MCG/ACT inhaler Inhale 2 puffs into the lungs every 4 (four) hours as needed for wheezing or shortness of breath. 08/04/22   [provider]  Vitamin D, Ergocalciferol, (DRISDOL) 1.25 MG (50000 UNIT) CAPS capsule Take 50,000 Units by mouth every Monday. 02/07/20   [provider]      Allergies    Aspirin and Mushroom extract complex (obsolete)    Review of Systems   Review of Systems  All other systems reviewed and are negative.   Physical Exam Updated Vital Signs BP (!) 148/121 (BP Location: Left Arm)   Pulse (!) 115   Temp 98.4 F (36.9 C) (Oral)   Resp 15   Ht 5\' 4"  (1.626 m)   Wt 83 kg   SpO2 97%   BMI 31.41 kg/m  Physical Exam Vitals and nursing note reviewed.  Constitutional:      General: She is not in acute distress.    Appearance: She is well-developed. She is not diaphoretic.  HENT:     Head: Normocephalic and atraumatic.  Cardiovascular:     Rate and Rhythm: Normal rate and regular rhythm.  Heart sounds: No murmur heard.    No friction rub. No gallop.  Pulmonary:     Effort: Respiratory distress present.     Breath sounds: Examination of the right-middle field reveals rhonchi. Examination of the left-middle field reveals rhonchi. Wheezing and rhonchi present.     Comments: Patient in mild respiratory distress at the time of my evaluation.  She does have difficulty finishing sentences and has audible wheezing. Abdominal:     General: Bowel sounds are normal. There is no distension.     Palpations: Abdomen is soft.     Tenderness: There is no abdominal tenderness.  Musculoskeletal:        General: Normal range of  motion.     Cervical back: Normal range of motion and neck supple.  Skin:    General: Skin is warm and dry.  Neurological:     General: No focal deficit present.     Mental Status: She is alert and oriented to person, place, and time.     ED Results / Procedures / Treatments   Labs (all labs ordered are listed, but only abnormal results are displayed) Labs Reviewed  BASIC METABOLIC PANEL WITH GFR - Abnormal; Notable for the following components:      Result Value   Glucose, Bld 110 (*)    Calcium 8.6 (*)    All other components within normal limits  CBC WITH DIFFERENTIAL/PLATELET - Abnormal; Notable for the following components:   WBC 13.4 (*)    Neutro Abs 8.5 (*)    Monocytes Absolute 1.7 (*)    Abs Immature Granulocytes 0.11 (*)    All other components within normal limits  TROPONIN I (HIGH SENSITIVITY) - Abnormal; Notable for the following components:   Troponin I (High Sensitivity) 400 (*)    All other components within normal limits  RESP PANEL BY RT-PCR (RSV, FLU A&B, COVID)  RVPGX2    EKG EKG Interpretation Date/Time:  Tuesday September 16 2023 03:21:11 EDT Ventricular Rate:  119 PR Interval:  130 QRS Duration:  93 QT Interval:  326 QTC Calculation: 459 R Axis:   92  Text Interpretation: Sinus tachycardia Right axis deviation Low voltage, precordial leads No significant change since 08/16/2023 Confirmed by Geoffery Lyons ([id number]) on 09/16/2023 3:52:15 AM  Radiology No results found.  Procedures Procedures    Medications Ordered in ED Medications  ipratropium-albuterol (DUONEB) 0.5-2.5 (3) MG/3ML nebulizer solution 3 mL (has no administration in time range)  ipratropium-albuterol (DUONEB) 0.5-2.5 (3) MG/3ML nebulizer solution 3 mL (has no administration in time range)    ED Course/ Medical Decision Making/ A&P  Patient is a 63 year old female with past medical history of COPD on home oxygen presenting with shortness of breath and wheezing.  Patient arrives here  in moderate respiratory distress with diffuse wheezing.  Vital signs otherwise stable.  Initial oxygen saturations are 98% on 3 L nasal cannula.  Laboratory studies obtained including CBC, basic metabolic panel, and troponin.  She has a leukocytosis with white count of 13.4 and troponin of 400 initially.  Upon repeat troponin is 1180.  Chest x-ray showing COPD without acute superimposed finding.  Patient has received magnesium and Solu-Medrol by EMS.  She was given breathing treatments by EMS and here.  Due to the elevation of troponin, care was discussed with Dr. Regino Schultze from cardiology.  We will await the results of the second troponin prior to starting anticoagulation.  As the second troponin has come back at 1180, heparin has been ordered.  Patient to be admitted to the hospitalist service.  CRITICAL CARE Performed by: Geoffery Lyons Total critical care time: 40 minutes Critical care time was exclusive of separately billable procedures and treating other patients. Critical care was necessary to treat or prevent imminent or life-threatening deterioration. Critical care was time spent personally by me on the following activities: development of treatment plan with patient and/or surrogate as well as nursing, discussions with consultants, evaluation of patient's response to treatment, examination of patient, obtaining history from patient or surrogate, ordering and performing treatments and interventions, ordering and review of laboratory studies, ordering and review of radiographic studies, pulse oximetry and re-evaluation of patient's condition.   Final Clinical Impression(s) / ED Diagnoses Final diagnoses:  None    Rx / DC Orders ED Discharge Orders     None         Geoffery Lyons, MD 09/16/23 [phone]

## 2023-09-16 NOTE — Progress Notes (Signed)
  Echocardiogram 2D Echocardiogram has been performed.  Ocie Doyne RDCS 09/16/2023, 3:31 PM

## 2023-09-16 NOTE — ED Notes (Signed)
 Date and time results received: 09/16/23 0610 (use smartphrase ".now" to insert current time)  Test: troponin Critical Value: 1080  Name of Provider Notified: Brandon Melnick, MD and Thomes Dinning, DO

## 2023-09-16 NOTE — Consult Note (Addendum)
 Cardiology Consultation   Patient ID: Erin Hobbs MRN: [id number]; DOB: 1960/07/26  Admit date: 09/16/2023 Date of Consult: 09/16/2023  PCP:  Erin Reas, PA-C   [facility] Providers Cardiologist:  Erin Pates, MD   {  Patient Profile:   Erin Hobbs is a 63 y.o. female with a hx of COPD (on home O2), GERD, tobacco use who is being seen 09/16/2023 for the evaluation of elevated troponin at the request of Dr. Thomes Hobbs.  History of Present Illness:   Erin Hobbs with the above medical history  No cardiac history  She last seen at [facility] ED for SOB, cough, and chest tightness on 08/16/2023. Diagnosed with COPD exacerbation. She was discharged after good response to treatment of  Solu-medrol and 3 Duonebs. She discharged with azithromycin, steriods and Tessalon Perles. She was scheduled for new patient apt with [facility]. Did not follow up with [facility] since not covered by insurance, [employer]-medicaid.    Presented to the ED on 09/16/23 for SOB and cough. ED exam revealed moderate respiratory distress with notable wheezing, rhonchi and she received 2 breathing treatments, steroids and magnesium. Also, currently on heparin gtt.  CMP unremarkable except Glu 110 and Ca 8.6. WBC 13.4  CXR c/w COPD and no acute cardiopulmonary findings.  HsTn drawn 40 > 1180 >1249 EKG: Sinus Tachycardia, HR 119  On patient interview, SOB worsened x 2 day, started while walking around the house. Unable to sleep supine x 4 days due to feeling like suffocating. Reports more severe than last episode in 08/2023. Associated with cough producing white sputum. Usually on 2L O2 at home but increased to 3L yesterday. Seen PCP on Saturday given steriods and abx, however, no relief with treatment.   CP described as intermittent crampy and pressure x 1 week, 4/10, lasting seconds, exacerbated by stress, no exertional CP, no radiation, non-pleuritic, numbness in fingers and face.  Reports edema  in legs, feets and hands x 1 week that did not improve with leg elevation.  Also dizziness with standing up to walk.    Reports tobacco use, <1/2 PPD, tried nicotine patches but did not work. Denies any ETOH or drug use. Diet usually homecooked meals with low sodium diet.  Activity throughout day walking while taking care of grandkids, which she has custody.  Family history significant on paternal side including MI by father in 33's and Aunt in 76's. Also great grandfather had a pacemaker.    Past Medical History:  Diagnosis Date   Arthritis    COPD (chronic obstructive pulmonary disease) (HCC)    Ulcer     Past Surgical History:  Procedure Laterality Date   TUBAL LIGATION       Home Medications:  Prior to Admission medications   Medication Sig Start Date End Date Taking? Authorizing Provider  acetaminophen (TYLENOL) 500 MG tablet Take 1,000 mg by mouth every 6 (six) hours as needed for fever.    [provider]  ALPRAZolam Prudy Feeler) 0.5 MG tablet Take 1 tablet (0.5 mg total) by mouth daily. Do not take close to bedtime for safety reasons. 08/17/22     amphetamine-dextroamphetamine (ADDERALL) 10 MG tablet Take 10 mg by mouth every other day. 02/07/20   [provider]  azithromycin (ZITHROMAX) 250 MG tablet Take first 2 tablets together, then 1 every day until finished. 08/16/23   Hobbs, Erin T, PA-C  benzonatate (TESSALON) 100 MG capsule Take 1 capsule (100 mg total) by mouth 2 (two)  times daily as needed for cough. 08/16/23   Hobbs, Erin T, PA-C  budesonide-formoterol (SYMBICORT) 160-4.5 MCG/ACT inhaler Inhale 2 puffs into the lungs 2 (two) times daily.    [provider]  Ipratropium-Albuterol (COMBIVENT) 20-100 MCG/ACT AERS respimat Inhale 1 puff into the lungs every 6 (six) hours. 02/23/20   Vassie Loll, MD  ipratropium-albuterol (DUONEB) 0.5-2.5 (3) MG/3ML SOLN Take 3 mLs by nebulization every 6 (six) hours as needed (SOB/wheezing not improved with the  use of inhaler.). 02/23/20   Vassie Loll, MD  nicotine (NICODERM CQ - DOSED IN MG/24 HOURS) 21 mg/24hr patch Place 1 patch (21 mg total) onto the skin daily. 02/24/20   Vassie Loll, MD  omeprazole (PRILOSEC) 40 MG capsule Take 40 mg by mouth daily. 06/02/22   [provider]  VENTOLIN HFA 108 (90 Base) MCG/ACT inhaler Inhale 2 puffs into the lungs every 4 (four) hours as needed for wheezing or shortness of breath. 08/04/22   [provider]  Vitamin D, Ergocalciferol, (DRISDOL) 1.25 MG (50000 UNIT) CAPS capsule Take 50,000 Units by mouth every Monday. 02/07/20   [provider]    Inpatient Medications: Scheduled Meds:  ALPRAZolam  0.5 mg Oral Daily   amphetamine-dextroamphetamine  10 mg Oral QODAY   aspirin  325 mg Oral Daily   budesonide (PULMICORT) nebulizer solution  0.25 mg Nebulization BID   levalbuterol  0.63 mg Nebulization Q6H   methylPREDNISolone (SOLU-MEDROL) injection  40 mg Intravenous Q12H   nicotine  21 mg Transdermal Daily   pantoprazole (PROTONIX) IV  40 mg Intravenous Q24H   Continuous Infusions:  azithromycin     heparin 850 Units/hr (09/16/23 0557)   PRN Meds: acetaminophen **OR** acetaminophen, nitroGLYCERIN, ondansetron **OR** ondansetron (ZOFRAN) IV, mouth rinse  Allergies:    Allergies  Allergen Reactions   Aspirin Other (See Comments)    Cannot take due to stomach ulcer   Mushroom Extract Complex (Obsolete) Other (See Comments)    Dizziness     Social History:   Social History   Socioeconomic History   Marital status: Legally Separated    Spouse name: Not on file   Number of children: Not on file   Years of education: Not on file   Highest education level: Not on file  Occupational History   Not on file  Tobacco Use   Smoking status: Every Day    Current packs/day: 0.50    Types: Cigarettes   Smokeless tobacco: Never  Vaping Use   Vaping status: Never Used  Substance and Sexual Activity   Alcohol use: No    Drug use: No   Sexual activity: Yes    Birth control/protection: Surgical  Other Topics Concern   Not on file  Social History Narrative   Not on file   Social Drivers of Health   Financial Resource Strain: Not on file  Food Insecurity: Patient Declined (09/16/2023)   Hunger Vital Sign    Worried About Running Out of Food in the Last Year: Patient declined    Ran Out of Food in the Last Year: Patient declined  Transportation Needs: Patient Declined (09/16/2023)   PRAPARE - Administrator, Civil Service (Medical): Patient declined    Lack of Transportation (Non-Medical): Patient declined  Physical Activity: Not on file  Stress: Not on file  Social Connections: Socially Isolated (09/16/2023)   Social Connection and Isolation Panel [NHANES]    Frequency of Communication with Friends and Family: More than three times a week  Frequency of Social Gatherings with Friends and Family: Once a week    Attends Religious Services: Never    Database administrator or Organizations: No    Attends Banker Meetings: Never    Marital Status: Separated  Intimate Partner Violence: Patient Declined (09/16/2023)   Humiliation, Afraid, Rape, and Kick questionnaire    Fear of Current or Ex-Partner: Patient declined    Emotionally Abused: Patient declined    Physically Abused: Patient declined    Sexually Abused: Patient declined    Family History:   Family History  Problem Relation Age of Onset   Heart attack Father 85   Heart attack Paternal Aunt 80     ROS:  Please see the history of present illness.  All other ROS reviewed and negative.     Physical Exam/Data:   Vitals:   09/16/23 0745 09/16/23 0800 09/16/23 0815 09/16/23 0900  BP: 123/82 125/86 117/77 (!) 141/94  Pulse: (!) 101 (!) 101 96 (!) 108  Resp: 17 16 15  (!) 22  Temp:      TempSrc:      SpO2: 97% 98% 97% 96%  Weight:      Height:       No intake or output data in the 24 hours ending 09/16/23 0933     09/16/2023    3:18 AM 08/16/2023    9:20 AM 09/04/2022    3:02 PM  Last 3 Weights  Weight (lbs) 183 lb 182 lb 175 lb 14.8 oz  Weight (kg) 83.008 kg 82.555 kg 79.8 kg     Body mass index is 31.41 kg/m.  General:  Laying in bed with audible wheezing in no acute distress, 3L O2 HEENT: normal Neck: no JVD Vascular: No carotid bruits; Distal pulses 2+ bilaterally Cardiac:  tachycardic; unable to hear heart sound with significant wheezing present Lungs: significant wheezing and rhonchi throughout, RR 22-24 Abd: soft, nontender, no hepatomegaly  Ext: trace edema Musculoskeletal:  No deformities, BUE and BLE strength normal and equal Skin: warm and dry  Neuro:  CNs 2-12 intact, no focal abnormalities noted Psych:  Normal affect   EKG:  The EKG was personally reviewed and demonstrates:  Sinus Tachycardia, HR 119, Right axis deviation  Telemetry:  Telemetry was personally reviewed and demonstrates:  Sinus tachycardia, HR 90-110's  Relevant CV Studies: ECHO pending  Laboratory Data:  High Sensitivity Troponin:   Recent Labs  Lab 09/16/23 0304 09/16/23 0510 09/16/23 0712  TROPONINIHS 400* 1,180* 1,249*     Chemistry Recent Labs  Lab 09/16/23 0304  NA 140  K 3.9  CL 103  CO2 26  GLUCOSE 110*  BUN 18  CREATININE 0.79  CALCIUM 8.6*  GFRNONAA >60  ANIONGAP 11    No results for input(s): "PROT", "ALBUMIN", "AST", "ALT", "ALKPHOS", "BILITOT" in the last 168 hours. Lipids No results for input(s): "CHOL", "TRIG", "HDL", "LABVLDL", "LDLCALC", "CHOLHDL" in the last 168 hours.  Hematology Recent Labs  Lab 09/16/23 0304  WBC 13.4*  RBC 4.87  HGB 13.8  HCT 44.1  MCV 90.6  MCH 28.3  MCHC 31.3  RDW 14.0  PLT 283   Thyroid No results for input(s): "TSH", "FREET4" in the last 168 hours.  BNPNo results for input(s): "BNP", "PROBNP" in the last 168 hours.  DDimer No results for input(s): "DDIMER" in the last 168 hours.   Radiology/Studies:  DG Chest Portable 1 View Result  Date: 09/16/2023 CLINICAL DATA:  Cough EXAM: PORTABLE CHEST 1 VIEW COMPARISON:  08/16/2023 FINDINGS: Hyperinflation and apical lucency. There is no edema, consolidation, effusion, or pneumothorax. Artifact from EKG leads. Normal heart size and mediastinal contours. IMPRESSION: COPD without acute superimposed finding. Electronically Signed   By: Tiburcio Pea M.D.   On: 09/16/2023 04:38     Assessment and Plan:   Elevated Troponin /NSTEMI Chest Pain  Reported brief, intermittent CP episodes  x1 week described as crampy and pressure, 4/10, lasting seconds, exacerbated by stress, non-exertional, non-pleuritic, associated with numbness in fingers and face.  HsTn 40 > 1180 >1249. EKG with no ischemic changes.  - Reported brief CP for few seconds during interview. Currently on IV heparin Echo ordered   CP episodes are atypical but Troponin bump is significant (>1000)    Her oxygen on this admission has been above 90% (documented)    Difficult therefore to explain on demand ischemia alone     She has a history of tob use and a strong FHx of CAD   Once her pulmonary status improves I would recomm [facility] to define anatomy   Will review echo when done    Pulmonary   Hx of COPD x 4-5 year that is currently treated with Symbicort, Combivent and Ventolin. Reported SOB worsened x 2 days. Reports orthopnea and edema. Home O2 usually 2 L [place] but increased to 3 L currently.  - In ED, treated with duoneb and steriod but no significant relief.  - RR 22-24. Seem euvolemic on exam. Audible wheezing and significant wheezing and rhonchi throughout.  Continue with steroids and nebs    Edema  Reported edema x 1 week in extremities.  Volume status does not appear bad BNP and echo ordered     Risk Assessment/Risk Scores:   New York Heart Association (NYHA) Functional Class NYHA Class III   For questions or updates, please contact [facility] Please consult [email or web address] for contact info under     Signed, Basilio Cairo, PA-C  09/16/2023 9:33 AM   Pt seen and examined   I have amended note above by S.  Luan Pulling to reflect my findings  Patient is a 63 year old with no history of CAD.  Presents with progressive shortness of breath, wheezing.  This is a second presentation in the past month.  She is being treated for COPD exacerbation. Troponins checked and have elevated to over 1000.  On exam, patient appears somewhat uncomfortable.  Still short of breath. Neck JVP is normal Lungs diffuse wheezes.  Moving air Cardiac exam.  Distant heart sounds.  No significant murmurs. Abdomen diffusely tender Extremities with trivial edema.  Feet warm.  EKG shows sinus rhythm.  No ST changes to suggest ischemia Echo was ordered  As noted above presentation is concerning.  Without documented profound hypoxia it is difficult to explain the troponin bump and demand ischemia alone.  Concerning that she may indeed have CAD along with this.  This is now the second presentation for similar complaints Will review echo.  However once she improves from pulmonary standpoint would recommend a left heart cath to define her anatomy, rule out severe coexistent CAD.  Will continue to follow  Erin Pates, MD.

## 2023-09-16 NOTE — ED Notes (Signed)
 Report accepted by receiving nurse. Pt has left ED

## 2023-09-16 NOTE — Progress Notes (Signed)
 PHARMACY - ANTICOAGULATION CONSULT NOTE  Pharmacy Consult for IV heparin Indication: chest pain/ACS  Allergies  Allergen Reactions   Aspirin Other (See Comments)    Cannot take due to stomach ulcer   Mushroom Extract Complex (Obsolete) Other (See Comments)    Dizziness     Patient Measurements: Height: 5\' 4"  (162.6 cm) Weight: 83 kg (183 lb) IBW/kg (Calculated) : 54.7 HEPARIN DW (KG): 72.8  Vital Signs: Temp: 98.4 F (36.9 C) (04/08 0415) Temp Source: Oral (04/08 0415) BP: 148/121 (04/08 0316) Pulse Rate: 115 (04/08 0415)  Labs: Recent Labs    09/16/23 0304  HGB 13.8  HCT 44.1  PLT 283  CREATININE 0.79  TROPONINIHS 400*    Estimated Creatinine Clearance: 76 mL/min (by C-G formula based on SCr of 0.79 mg/dL).   Medical History: Past Medical History:  Diagnosis Date   Arthritis    COPD (chronic obstructive pulmonary disease) (HCC)    Ulcer     Assessment: Erin Hobbs is a 63 y.o. year old female admitted on 09/16/2023 with concern for ACS. No anticoagulation prior to admission on dispense history. Pharmacy consulted to dose heparin.  Goal of Therapy:  Heparin level 0.3-0.7 units/ml Monitor platelets by anticoagulation protocol: Yes   Plan:  Heparin 4000 units x 1 as bolus followed by heparin infusion at 850 units/hr 6 heparin level  Daily heparin level, CBC, and monitoring for bleeding F/u cards rec   Thank you for allowing pharmacy to participate in this patient's care.  Marja Kays, PharmD Emergency Medicine Clinical Pharmacist 09/16/2023,5:23 AM

## 2023-09-16 NOTE — Progress Notes (Signed)
 PHARMACY - ANTICOAGULATION CONSULT NOTE  Pharmacy Consult for IV heparin Indication: chest pain/ACS  Allergies  Allergen Reactions   Aspirin Other (See Comments)    Cannot take due to stomach ulcer   Mushroom Extract Complex (Obsolete) Other (See Comments)    Dizziness     Patient Measurements: Height: 5\' 4"  (162.6 cm) Weight: 83 kg (183 lb) IBW/kg (Calculated) : 54.7 HEPARIN DW (KG): 72.8  Vital Signs: Temp: 98.4 F (36.9 C) (04/08 0415) Temp Source: Oral (04/08 0415) BP: 141/94 (04/08 0900) Pulse Rate: 108 (04/08 0900)  Labs: Recent Labs    09/16/23 0304 09/16/23 0510 09/16/23 0712 09/16/23 0939 09/16/23 1217  HGB 13.8  --   --   --   --   HCT 44.1  --   --   --   --   PLT 283  --   --   --   --   HEPARINUNFRC  --   --   --   --  0.26*  CREATININE 0.79  --   --   --   --   TROPONINIHS 400* 1,180* 1,249* 1,333*  --     Estimated Creatinine Clearance: 76 mL/min (by C-G formula based on SCr of 0.79 mg/dL).   Medical History: Past Medical History:  Diagnosis Date   Arthritis    COPD (chronic obstructive pulmonary disease) (HCC)    Ulcer     Assessment: Erin Hobbs is a 63 y.o. year old female admitted on 09/16/2023 with concern for ACS. No anticoagulation prior to admission on dispense history. Pharmacy consulted to dose heparin.  Initial heparin level just below goal at 0.26 on 850 units/hr. No bleeding issues noted. CBC normal overnight.   Goal of Therapy:  Heparin level 0.3-0.7 units/ml Monitor platelets by anticoagulation protocol: Yes   Plan:  Increase heparin to 1000 units/hr Recheck heparin level in 6 hours Possible cath once respiratory status improves  Thank you for allowing pharmacy to participate in this patient's care.  Sheppard Coil PharmD., BCPS Clinical Pharmacist 09/16/2023 12:48 PM

## 2023-09-16 NOTE — ED Triage Notes (Signed)
 Pt bib EMS for c/o sob. States she has had upper respiratory symptoms x 1 week. Pt also c/o diarrhea x 1 week. EMS administered 2 Duonebs en route as well as gave 125mg  Solumedrol and 2gms of Magnesium IV. During triage, pt reports chest tightness.

## 2023-09-16 NOTE — ED Notes (Signed)
 Date and time results received: 09/16/23 0415 (use smartphrase ".now" to insert current time)  Test: troponin Critical Value: 400  Name of Provider Notified: Brandon Melnick, MD

## 2023-09-16 NOTE — H&P (Signed)
 History and Physical    Erin Hobbs ONG:[id number] DOB: [birth year] DOA: 09/16/2023  PCP: Jamey Reas, PA-C   Patient coming from: Home  Chief Complaint: Shortness of breath/wheezing  HPI: Erin Hobbs is a 63 y.o. female with medical history significant for COPD with chronic hypoxemic respiratory failure and tobacco abuse who presented to the ED for worsening shortness of breath and cough.  She denied any chest pain, but does claim to have worsening shortness of breath in the last 2 days that appears to be related to exertion.  She does describe some mild chest pressure and has had some radiation to her fingertips on the left side.  She denies any fevers or chills, but has had some cough with whitish sputum noted.  Usually wears 2 L nasal cannula at home.  She has been trying to quit smoking, but currently smokes less than half a pack per day.   ED Course: Vital signs with some mild tachycardia, but otherwise stable.  Leukocytosis of 13,000 noted.  Troponin elevation over 1200 currently noted and EKG with no significant findings aside from sinus tachycardia.  Chest x-ray with findings of COPD noted.  Started on heparin infusion and cardiology consulted.  Review of Systems: Reviewed as noted above, otherwise negative.  Past Medical History:  Diagnosis Date   Arthritis    COPD (chronic obstructive pulmonary disease) (HCC)    Ulcer     Past Surgical History:  Procedure Laterality Date   TUBAL LIGATION       reports that she has been smoking cigarettes. She has never used smokeless tobacco. She reports that she does not drink alcohol and does not use drugs.  Allergies  Allergen Reactions   Aspirin Other (See Comments)    Cannot take due to stomach ulcer   Mushroom Extract Complex (Obsolete) Other (See Comments)    Dizziness     History reviewed. No pertinent family history.  Prior to Admission medications   Medication Sig Start Date End Date Taking? Authorizing  Provider  acetaminophen (TYLENOL) 500 MG tablet Take 1,000 mg by mouth every 6 (six) hours as needed for fever.    [provider]  ALPRAZolam Prudy Feeler) 0.5 MG tablet Take 1 tablet (0.5 mg total) by mouth daily. Do not take close to bedtime for safety reasons. 08/17/22     amphetamine-dextroamphetamine (ADDERALL) 10 MG tablet Take 10 mg by mouth every other day. 02/07/20   [provider]  azithromycin (ZITHROMAX) 250 MG tablet Take first 2 tablets together, then 1 every day until finished. 08/16/23   Roemhildt, Lorin T, PA-C  benzonatate (TESSALON) 100 MG capsule Take 1 capsule (100 mg total) by mouth 2 (two) times daily as needed for cough. 08/16/23   Roemhildt, Lorin T, PA-C  budesonide-formoterol (SYMBICORT) 160-4.5 MCG/ACT inhaler Inhale 2 puffs into the lungs 2 (two) times daily.    [provider]  Ipratropium-Albuterol (COMBIVENT) 20-100 MCG/ACT AERS respimat Inhale 1 puff into the lungs every 6 (six) hours. 02/23/20   Vassie Loll, MD  ipratropium-albuterol (DUONEB) 0.5-2.5 (3) MG/3ML SOLN Take 3 mLs by nebulization every 6 (six) hours as needed (SOB/wheezing not improved with the use of inhaler.). 02/23/20   Vassie Loll, MD  nicotine (NICODERM CQ - DOSED IN MG/24 HOURS) 21 mg/24hr patch Place 1 patch (21 mg total) onto the skin daily. 02/24/20   Vassie Loll, MD  omeprazole (PRILOSEC) 40 MG capsule Take 40 mg by mouth daily. 06/02/22   [provider]  Terald Sleeper  HFA 108 (90 Base) MCG/ACT inhaler Inhale 2 puffs into the lungs every 4 (four) hours as needed for wheezing or shortness of breath. 08/04/22   [provider]  Vitamin D, Ergocalciferol, (DRISDOL) 1.25 MG (50000 UNIT) CAPS capsule Take 50,000 Units by mouth every Monday. 02/07/20   [provider]    Physical Exam: Vitals:   09/16/23 0745 09/16/23 0800 09/16/23 0815 09/16/23 0900  BP: 123/82 125/86 117/77 (!) 141/94  Pulse: (!) 101 (!) 101 96 (!) 108  Resp: 17 16 15  (!) 22  Temp:       TempSrc:      SpO2: 97% 98% 97% 96%  Weight:      Height:        Constitutional: NAD, calm, comfortable Vitals:   09/16/23 0745 09/16/23 0800 09/16/23 0815 09/16/23 0900  BP: 123/82 125/86 117/77 (!) 141/94  Pulse: (!) 101 (!) 101 96 (!) 108  Resp: 17 16 15  (!) 22  Temp:      TempSrc:      SpO2: 97% 98% 97% 96%  Weight:      Height:       Eyes: lids and conjunctivae normal Neck: normal, supple Respiratory: Diminished breath sounds bilaterally, 4 L nasal cannula Cardiovascular: Regular rate and rhythm, no murmurs. Abdomen: no tenderness, no distention. Bowel sounds positive.  Musculoskeletal: Scant bilateral edema. Skin: no rashes, lesions, ulcers.  Psychiatric: Flat affect  Labs on Admission: I have personally reviewed following labs and imaging studies  CBC: Recent Labs  Lab 09/16/23 0304  WBC 13.4*  NEUTROABS 8.5*  HGB 13.8  HCT 44.1  MCV 90.6  PLT 283   Basic Metabolic Panel: Recent Labs  Lab 09/16/23 0304  NA 140  K 3.9  CL 103  CO2 26  GLUCOSE 110*  BUN 18  CREATININE 0.79  CALCIUM 8.6*   GFR: Estimated Creatinine Clearance: 76 mL/min (by C-G formula based on SCr of 0.79 mg/dL). Liver Function Tests: No results for input(s): "AST", "ALT", "ALKPHOS", "BILITOT", "PROT", "ALBUMIN" in the last 168 hours. No results for input(s): "LIPASE", "AMYLASE" in the last 168 hours. No results for input(s): "AMMONIA" in the last 168 hours. Coagulation Profile: No results for input(s): "INR", "PROTIME" in the last 168 hours. Cardiac Enzymes: No results for input(s): "CKTOTAL", "CKMB", "CKMBINDEX", "TROPONINI" in the last 168 hours. BNP (last 3 results) No results for input(s): "PROBNP" in the last 8760 hours. HbA1C: No results for input(s): "HGBA1C" in the last 72 hours. CBG: No results for input(s): "GLUCAP" in the last 168 hours. Lipid Profile: No results for input(s): "CHOL", "HDL", "LDLCALC", "TRIG", "CHOLHDL", "LDLDIRECT" in the last 72  hours. Thyroid Function Tests: No results for input(s): "TSH", "T4TOTAL", "FREET4", "T3FREE", "THYROIDAB" in the last 72 hours. Anemia Panel: No results for input(s): "VITAMINB12", "FOLATE", "FERRITIN", "TIBC", "IRON", "RETICCTPCT" in the last 72 hours. Urine analysis:    Component Value Date/Time   COLORURINE YELLOW 08/19/2014 1501   APPEARANCEUR CLEAR 08/19/2014 1501   LABSPEC >1.030 (H) 08/19/2014 1501   PHURINE 5.0 08/19/2014 1501   GLUCOSEU NEGATIVE 08/19/2014 1501   HGBUR MODERATE (A) 08/19/2014 1501   BILIRUBINUR NEGATIVE 08/19/2014 1501   KETONESUR NEGATIVE 08/19/2014 1501   PROTEINUR NEGATIVE 08/19/2014 1501   UROBILINOGEN 0.2 08/19/2014 1501   NITRITE NEGATIVE 08/19/2014 1501   LEUKOCYTESUR SMALL (A) 08/19/2014 1501    Radiological Exams on Admission: DG Chest Portable 1 View Result Date: 09/16/2023 CLINICAL DATA:  Cough EXAM: PORTABLE CHEST 1 VIEW COMPARISON:  08/16/2023 FINDINGS:  Hyperinflation and apical lucency. There is no edema, consolidation, effusion, or pneumothorax. Artifact from EKG leads. Normal heart size and mediastinal contours. IMPRESSION: COPD without acute superimposed finding. Electronically Signed   By: Tiburcio Pea M.D.   On: 09/16/2023 04:38    EKG: Independently reviewed.  ST 119 bpm.  Assessment/Plan Principal Problem:   Acute exacerbation of chronic obstructive pulmonary disease (COPD) (HCC) Active Problems:   Gastroesophageal reflux disease   Class 1 obesity   Tobacco abuse   Acute on chronic hypoxic respiratory failure (HCC)    Acute on chronic hypoxemic respiratory failure likely secondary to acute COPD exacerbation -Chronically on 2 L nasal cannula at home currently on 4L -Maintain on IV Solu-Medrol as well as Pulmicort twice daily -DuoNebs for shortness of breath and wheezing -Chest x-ray currently clear, but noted to have leukocytosis -Start azithromycin and check procalcitonin -Influenza, RSV, and COVID testing  negative  Troponin elevation with suspicion of NSTEMI -Continue heparin drip -Plans to obtain 2D echocardiogram -Appreciate cardiology recommendations  GERD -Continue on PPI IV daily  Class I obesity -BMI 31.41  Tobacco abuse -Counseled on cessation   DVT prophylaxis: Heparin drip Code Status: Full Family Communication: None at bedside Disposition Plan: Admit for evaluation of NSTEMI/COPD exacerbation Consults called: Cardiology Admission status: Obs, Tele  Severity of Illness: The appropriate patient status for this patient is OBSERVATION. Observation status is judged to be reasonable and necessary in order to provide the required intensity of service to ensure the patient's safety. The patient's presenting symptoms, physical exam findings, and initial radiographic and laboratory data in the context of their medical condition is felt to place them at decreased risk for further clinical deterioration. Furthermore, it is anticipated that the patient will be medically stable for discharge from the hospital within 2 midnights of admission.    Jyra Lagares D Sherryll Burger DO [facility]  If 7PM-7AM, please contact night-coverage [email or web address]  09/16/2023, 9:20 AM

## 2023-09-16 NOTE — Progress Notes (Signed)
   09/16/23 2240  TOC Brief Assessment  Insurance and Status Reviewed  Patient has primary care physician Yes  Home environment has been reviewed From home  Prior level of function: Mostly independent  Prior/Current Home Services No current home services  Social Drivers of Health Review SDOH reviewed interventions complete (Smoking cessations added to AVS)  Readmission risk has been reviewed Yes  Transition of care needs no transition of care needs at this time   Transition of Care Department [facility]) has reviewed patient and no other TOC needs have been identified at this time. We will continue to monitor patient advancement through interdisciplinary progression rounds. If new patient needs arise, please place a TOC consult.

## 2023-09-16 NOTE — ED Notes (Signed)
 ED TO INPATIENT HANDOFF REPORT  ED Nurse Name and Phone #: [phone]  S Name/Age/Gender Erin Hobbs 63 y.o. female Room/Bed: APA10/APA10  Code Status   Code Status: Prior  Home/SNF/Other Home Patient oriented to: self, place, time, and situation Is this baseline? Yes   Triage Complete: Triage complete  Chief Complaint Acute exacerbation of chronic obstructive pulmonary disease (COPD) (HCC) [J44.1]  Triage Note Pt bib EMS for c/o sob. States she has had upper respiratory symptoms x 1 week. Pt also c/o diarrhea x 1 week. EMS administered 2 Duonebs en route as well as gave 125mg  Solumedrol and 2gms of Magnesium IV. During triage, pt reports chest tightness.    Allergies Allergies  Allergen Reactions   Aspirin Other (See Comments)    Cannot take due to stomach ulcer   Mushroom Extract Complex (Obsolete) Other (See Comments)    Dizziness     Level of Care/Admitting Diagnosis ED Disposition     ED Disposition  Admit   Condition  --   Comment  Hospital Area: [facility] [[id number]]  Level of Care: Telemetry [5]  Covid Evaluation: Asymptomatic - no recent exposure (last 10 days) testing not required  Diagnosis: Acute exacerbation of chronic obstructive pulmonary disease (COPD) (HCC) [098119]  Admitting Physician: Frankey Shown [[id number]]  Attending Physician: Frankey Shown [[id number]]          B Medical/Surgery History Past Medical History:  Diagnosis Date   Arthritis    COPD (chronic obstructive pulmonary disease) (HCC)    Ulcer    Past Surgical History:  Procedure Laterality Date   TUBAL LIGATION       A IV Location/Drains/Wounds Patient Lines/Drains/Airways Status     Active Line/Drains/Airways     Name Placement date Placement time Site Days   Peripheral IV 09/16/23 22 G Left Hand 09/16/23  --  Hand  less than 1            Intake/Output Last 24 hours No intake or output data in the 24 hours ending 09/16/23  [id number]  Labs/Imaging Results for orders placed or performed during the hospital encounter of 09/16/23 (from the past 48 hours)  Troponin I (High Sensitivity)     Status: Abnormal   Collection Time: 09/16/23  3:04 AM  Result Value Ref Range   Troponin I (High Sensitivity) 400 (HH) <18 ng/L    Comment: CRITICAL RESULT CALLED TO, READ BACK BY AND VERIFIED WITH E.TEASLEY [phone], VIRAY,J (NOTE) Elevated high sensitivity troponin I (hsTnI) values and significant  changes across serial measurements may suggest ACS but many other  chronic and acute conditions are known to elevate hsTnI results.  Refer to the "Links" section for chest pain algorithms and additional  guidance. Performed at [facility], [address], [place], [place] [address]   Basic metabolic panel     Status: Abnormal   Collection Time: 09/16/23  3:04 AM  Result Value Ref Range   Sodium 140 135 - 145 mmol/L   Potassium 3.9 3.5 - 5.1 mmol/L   Chloride 103 98 - 111 mmol/L   CO2 26 22 - 32 mmol/L   Glucose, Bld 110 (H) 70 - 99 mg/dL    Comment: Glucose reference range applies only to samples taken after fasting for at least 8 hours.   BUN 18 8 - 23 mg/dL   Creatinine, Ser 2.44 0.44 - 1.00 mg/dL   Calcium 8.6 (L) 8.9 - 10.3 mg/dL   GFR, Estimated >01 >02 mL/min  Comment: (NOTE) Calculated using the CKD-EPI Creatinine Equation (2021)    Anion gap 11 5 - 15    Comment: Performed at [facility], [address] [place], [place] [address]  CBC with Differential     Status: Abnormal   Collection Time: 09/16/23  3:04 AM  Result Value Ref Range   WBC 13.4 (H) 4.0 - 10.5 K/uL   RBC 4.87 3.87 - 5.11 MIL/uL   Hemoglobin 13.8 12.0 - 15.0 g/dL   HCT 60.4 54.0 - 98.1 %   MCV 90.6 80.0 - 100.0 fL   MCH 28.3 26.0 - 34.0 pg   MCHC 31.3 30.0 - 36.0 g/dL   RDW 19.1 47.8 - 29.5 %   Platelets 283 150 - 400 K/uL   nRBC 0.0 0.0 - 0.2 %   Neutrophils Relative % 64 %   Neutro Abs 8.5 (H) 1.7 - 7.7 K/uL   Lymphocytes Relative 22  %   Lymphs Abs 2.9 0.7 - 4.0 K/uL   Monocytes Relative 12 %   Monocytes Absolute 1.7 (H) 0.1 - 1.0 K/uL   Eosinophils Relative 1 %   Eosinophils Absolute 0.1 0.0 - 0.5 K/uL   Basophils Relative 0 %   Basophils Absolute 0.1 0.0 - 0.1 K/uL   Immature Granulocytes 1 %   Abs Immature Granulocytes 0.11 (H) 0.00 - 0.07 K/uL    Comment: Performed at [facility], [address], [place], [place] [address]  Resp panel by RT-PCR (RSV, Flu A&B, Covid) Anterior Nasal Swab     Status: None   Collection Time: 09/16/23  3:27 AM   Specimen: Anterior Nasal Swab  Result Value Ref Range   SARS Coronavirus 2 by RT PCR NEGATIVE NEGATIVE    Comment: (NOTE) SARS-CoV-2 target nucleic acids are NOT DETECTED.  The SARS-CoV-2 RNA is generally detectable in upper respiratory specimens during the acute phase of infection. The lowest concentration of SARS-CoV-2 viral copies this assay can detect is 138 copies/mL. A negative result does not preclude SARS-Cov-2 infection and should not be used as the sole basis for treatment or other patient management decisions. A negative result may occur with  improper specimen collection/handling, submission of specimen other than nasopharyngeal swab, presence of viral mutation(s) within the areas targeted by this assay, and inadequate number of viral copies(<138 copies/mL). A negative result must be combined with clinical observations, patient history, and epidemiological information. The expected result is Negative.  Fact Sheet for Patients:  BloggerCourse.com  Fact Sheet for Healthcare Providers:  SeriousBroker.it  This test is no t yet approved or cleared by the [place] FDA and  has been authorized for detection and/or diagnosis of SARS-CoV-2 by FDA under an Emergency Use Authorization (EUA). This EUA will remain  in effect (meaning this test can be used) for the duration of the COVID-19 declaration under  Section 564(b)(1) of the Act, 21 U.S.C.section 360bbb-3(b)(1), unless the authorization is terminated  or revoked sooner.       Influenza A by PCR NEGATIVE NEGATIVE   Influenza B by PCR NEGATIVE NEGATIVE    Comment: (NOTE) The Xpert Xpress SARS-CoV-2/FLU/RSV plus assay is intended as an aid in the diagnosis of influenza from Nasopharyngeal swab specimens and should not be used as a sole basis for treatment. Nasal washings and aspirates are unacceptable for Xpert Xpress SARS-CoV-2/FLU/RSV testing.  Fact Sheet for Patients: BloggerCourse.com  Fact Sheet for Healthcare Providers: SeriousBroker.it  This test is not yet approved or cleared by the [place] FDA and has  been authorized for detection and/or diagnosis of SARS-CoV-2 by FDA under an Emergency Use Authorization (EUA). This EUA will remain in effect (meaning this test can be used) for the duration of the COVID-19 declaration under Section 564(b)(1) of the Act, 21 U.S.C. section 360bbb-3(b)(1), unless the authorization is terminated or revoked.     Resp Syncytial Virus by PCR NEGATIVE NEGATIVE    Comment: (NOTE) Fact Sheet for Patients: BloggerCourse.com  Fact Sheet for Healthcare Providers: SeriousBroker.it  This test is not yet approved or cleared by the [place] FDA and has been authorized for detection and/or diagnosis of SARS-CoV-2 by FDA under an Emergency Use Authorization (EUA). This EUA will remain in effect (meaning this test can be used) for the duration of the COVID-19 declaration under Section 564(b)(1) of the Act, 21 U.S.C. section 360bbb-3(b)(1), unless the authorization is terminated or revoked.  Performed at [facility], [address], [place], [place] [address]   Troponin I (High Sensitivity)     Status: Abnormal   Collection Time: 09/16/23  5:10 AM  Result Value Ref Range   Troponin I  (High Sensitivity) 1,180 (HH) <18 ng/L    Comment: CRITICAL RESULT CALLED TO, READ BACK BY AND VERIFIED WITH  E. TEASLEY [phone], VIRAY,J (NOTE) Elevated high sensitivity troponin I (hsTnI) values and significant  changes across serial measurements may suggest ACS but many other  chronic and acute conditions are known to elevate hsTnI results.  Refer to the "Links" section for chest pain algorithms and additional  guidance. Performed at [facility], [address], [place], [place] [address]   Troponin I (High Sensitivity)     Status: Abnormal   Collection Time: 09/16/23  7:12 AM  Result Value Ref Range   Troponin I (High Sensitivity) 1,249 (HH) <18 ng/L    Comment: CRITICAL RESULT CALLED TO, READ BACK BY AND VERIFIED WITH ULEASE,J AT 7:50AM ON 09/16/23 BY FESTERMAN,C (NOTE) Elevated high sensitivity troponin I (hsTnI) values and significant  changes across serial measurements may suggest ACS but many other  chronic and acute conditions are known to elevate hsTnI results.  Refer to the "Links" section for chest pain algorithms and additional  guidance. Performed at [facility], [address] [place], [place] [address]    DG Chest Portable 1 View Result Date: 09/16/2023 CLINICAL DATA:  Cough EXAM: PORTABLE CHEST 1 VIEW COMPARISON:  08/16/2023 FINDINGS: Hyperinflation and apical lucency. There is no edema, consolidation, effusion, or pneumothorax. Artifact from EKG leads. Normal heart size and mediastinal contours. IMPRESSION: COPD without acute superimposed finding. Electronically Signed   By: Tiburcio Pea M.D.   On: 09/16/2023 04:38    Pending Labs Unresulted Labs (From admission, onward)     Start     Ordered   09/17/23 0500  Heparin level (unfractionated)  Daily,   R      09/16/23 0528   09/17/23 0500  CBC  Daily,   R      09/16/23 0528   09/16/23 1200  Heparin level (unfractionated)  Once-Timed,   URGENT        09/16/23 0526            Vitals/Pain Today's  Vitals   09/16/23 0345 09/16/23 0400 09/16/23 0415 09/16/23 0415  BP:      Pulse: (!) 101 (!) 109 (!) 115   Resp: (!) 24 18 15    Temp:    98.4 F (36.9 C)  TempSrc:    Oral  SpO2: 97% 97% 97%  Weight:      Height:      PainSc:        Isolation Precautions No active isolations  Medications Medications  heparin ADULT infusion 100 units/mL (25000 units/278mL) (850 Units/hr Intravenous New Bag/Given 09/16/23 0557)  ipratropium-albuterol (DUONEB) 0.5-2.5 (3) MG/3ML nebulizer solution 3 mL (3 mLs Nebulization Given 09/16/23 0508)  ipratropium-albuterol (DUONEB) 0.5-2.5 (3) MG/3ML nebulizer solution 3 mL (3 mLs Nebulization Given 09/16/23 0507)  heparin bolus via infusion 4,000 Units (4,000 Units Intravenous Bolus from Bag 09/16/23 0558)    Mobility Pt is ambulatory but is unable to maintain o2 sat during activity.     Focused Assessments    R Recommendations: See Admitting Provider Note  Report given to:   Additional Notes:

## 2023-09-17 DIAGNOSIS — Z91018 Allergy to other foods: Secondary | ICD-10-CM | POA: Diagnosis not present

## 2023-09-17 DIAGNOSIS — Z1152 Encounter for screening for COVID-19: Secondary | ICD-10-CM | POA: Diagnosis not present

## 2023-09-17 DIAGNOSIS — Z6831 Body mass index (BMI) 31.0-31.9, adult: Secondary | ICD-10-CM | POA: Diagnosis not present

## 2023-09-17 DIAGNOSIS — E66811 Obesity, class 1: Secondary | ICD-10-CM | POA: Diagnosis present

## 2023-09-17 DIAGNOSIS — K219 Gastro-esophageal reflux disease without esophagitis: Secondary | ICD-10-CM

## 2023-09-17 DIAGNOSIS — Z604 Social exclusion and rejection: Secondary | ICD-10-CM | POA: Diagnosis present

## 2023-09-17 DIAGNOSIS — J9621 Acute and chronic respiratory failure with hypoxia: Secondary | ICD-10-CM | POA: Diagnosis present

## 2023-09-17 DIAGNOSIS — Z635 Disruption of family by separation and divorce: Secondary | ICD-10-CM | POA: Diagnosis not present

## 2023-09-17 DIAGNOSIS — Z720 Tobacco use: Secondary | ICD-10-CM

## 2023-09-17 DIAGNOSIS — I214 Non-ST elevation (NSTEMI) myocardial infarction: Secondary | ICD-10-CM | POA: Diagnosis present

## 2023-09-17 DIAGNOSIS — E785 Hyperlipidemia, unspecified: Secondary | ICD-10-CM | POA: Diagnosis present

## 2023-09-17 DIAGNOSIS — Z7951 Long term (current) use of inhaled steroids: Secondary | ICD-10-CM | POA: Diagnosis not present

## 2023-09-17 DIAGNOSIS — Z8249 Family history of ischemic heart disease and other diseases of the circulatory system: Secondary | ICD-10-CM | POA: Diagnosis not present

## 2023-09-17 DIAGNOSIS — K259 Gastric ulcer, unspecified as acute or chronic, without hemorrhage or perforation: Secondary | ICD-10-CM | POA: Diagnosis present

## 2023-09-17 DIAGNOSIS — I110 Hypertensive heart disease with heart failure: Secondary | ICD-10-CM | POA: Diagnosis present

## 2023-09-17 DIAGNOSIS — Z7982 Long term (current) use of aspirin: Secondary | ICD-10-CM | POA: Diagnosis not present

## 2023-09-17 DIAGNOSIS — Z9981 Dependence on supplemental oxygen: Secondary | ICD-10-CM | POA: Diagnosis not present

## 2023-09-17 DIAGNOSIS — F17210 Nicotine dependence, cigarettes, uncomplicated: Secondary | ICD-10-CM | POA: Diagnosis present

## 2023-09-17 DIAGNOSIS — D72829 Elevated white blood cell count, unspecified: Secondary | ICD-10-CM | POA: Diagnosis not present

## 2023-09-17 DIAGNOSIS — Z79899 Other long term (current) drug therapy: Secondary | ICD-10-CM | POA: Diagnosis not present

## 2023-09-17 DIAGNOSIS — I5021 Acute systolic (congestive) heart failure: Secondary | ICD-10-CM | POA: Diagnosis present

## 2023-09-17 DIAGNOSIS — I2510 Atherosclerotic heart disease of native coronary artery without angina pectoris: Secondary | ICD-10-CM | POA: Diagnosis present

## 2023-09-17 DIAGNOSIS — J441 Chronic obstructive pulmonary disease with (acute) exacerbation: Secondary | ICD-10-CM | POA: Diagnosis present

## 2023-09-17 DIAGNOSIS — Z886 Allergy status to analgesic agent status: Secondary | ICD-10-CM | POA: Diagnosis not present

## 2023-09-17 DIAGNOSIS — T380X5A Adverse effect of glucocorticoids and synthetic analogues, initial encounter: Secondary | ICD-10-CM | POA: Diagnosis not present

## 2023-09-17 LAB — BASIC METABOLIC PANEL WITH GFR
Anion gap: 8 (ref 5–15)
BUN: 17 mg/dL (ref 8–23)
CO2: 28 mmol/L (ref 22–32)
Calcium: 8.7 mg/dL — ABNORMAL LOW (ref 8.9–10.3)
Chloride: 101 mmol/L (ref 98–111)
Creatinine, Ser: 0.67 mg/dL (ref 0.44–1.00)
GFR, Estimated: >60 mL/min (ref >60)
Glucose, Bld: 152 mg/dL — ABNORMAL HIGH (ref 70–99)
Potassium: 4.4 mmol/L (ref 3.5–5.1)
Sodium: 137 mmol/L (ref 135–145)

## 2023-09-17 LAB — CBC
HCT: 41.9 % (ref 36.0–46.0)
Hemoglobin: 13 g/dL (ref 12.0–15.0)
MCH: 28.2 pg (ref 26.0–34.0)
MCHC: 31 g/dL (ref 30.0–36.0)
MCV: 90.9 fL (ref 80.0–100.0)
Platelets: 270 10*3/uL (ref 150–400)
RBC: 4.61 MIL/uL (ref 3.87–5.11)
RDW: 14.5 % (ref 11.5–15.5)
WBC: 11.8 10*3/uL — ABNORMAL HIGH (ref 4.0–10.5)
nRBC: 0 % (ref 0.0–0.2)

## 2023-09-17 LAB — TROPONIN I (HIGH SENSITIVITY): Troponin I (High Sensitivity): 679 ng/L (ref <18)

## 2023-09-17 LAB — HEPARIN LEVEL (UNFRACTIONATED): Heparin Unfractionated: 0.68 [IU]/mL (ref 0.30–0.70)

## 2023-09-17 LAB — MAGNESIUM: Magnesium: 2.8 mg/dL — ABNORMAL HIGH (ref 1.7–2.4)

## 2023-09-17 MED ORDER — REVEFENACIN 175 MCG/3ML IN SOLN
175.0000 ug | Freq: Every day | RESPIRATORY_TRACT | Status: DC
  Administered 2023-09-17 – 2023-09-21 (×5): 175 ug via RESPIRATORY_TRACT
  Filled 2023-09-17 (×5): qty 3

## 2023-09-17 MED ORDER — ASPIRIN 81 MG PO TBEC
81.0000 mg | DELAYED_RELEASE_TABLET | Freq: Every day | ORAL | Status: DC
  Administered 2023-09-17 – 2023-09-21 (×5): 81 mg via ORAL
  Filled 2023-09-17 (×5): qty 1

## 2023-09-17 MED ORDER — IPRATROPIUM-ALBUTEROL 0.5-2.5 (3) MG/3ML IN SOLN
3.0000 mL | RESPIRATORY_TRACT | Status: DC | PRN
  Administered 2023-09-18 – 2023-09-19 (×5): 3 mL via RESPIRATORY_TRACT
  Filled 2023-09-17 (×6): qty 3

## 2023-09-17 MED ORDER — BENZONATATE 100 MG PO CAPS
100.0000 mg | ORAL_CAPSULE | Freq: Three times a day (TID) | ORAL | Status: DC | PRN
  Administered 2023-09-17: 100 mg via ORAL
  Filled 2023-09-17: qty 1

## 2023-09-17 MED ORDER — SACUBITRIL-VALSARTAN 24-26 MG PO TABS
1.0000 | ORAL_TABLET | Freq: Two times a day (BID) | ORAL | Status: DC
Start: 2023-09-17 — End: 2023-09-21
  Administered 2023-09-17 – 2023-09-21 (×9): 1 via ORAL
  Filled 2023-09-17 (×9): qty 1

## 2023-09-17 MED ORDER — GUAIFENESIN ER 600 MG PO TB12
1200.0000 mg | ORAL_TABLET | Freq: Two times a day (BID) | ORAL | Status: DC
  Administered 2023-09-17 – 2023-09-21 (×8): 1200 mg via ORAL
  Filled 2023-09-17 (×8): qty 2

## 2023-09-17 MED ORDER — ROSUVASTATIN CALCIUM 20 MG PO TABS
20.0000 mg | ORAL_TABLET | Freq: Every day | ORAL | Status: DC
  Administered 2023-09-17 – 2023-09-21 (×5): 20 mg via ORAL
  Filled 2023-09-17 (×5): qty 1

## 2023-09-17 MED ORDER — LEVALBUTEROL HCL 0.63 MG/3ML IN NEBU
0.6300 mg | INHALATION_SOLUTION | RESPIRATORY_TRACT | Status: DC | PRN
  Administered 2023-09-17: 0.63 mg via RESPIRATORY_TRACT

## 2023-09-17 MED ORDER — ARFORMOTEROL TARTRATE 15 MCG/2ML IN NEBU
15.0000 ug | INHALATION_SOLUTION | Freq: Two times a day (BID) | RESPIRATORY_TRACT | Status: DC
  Administered 2023-09-17 – 2023-09-21 (×8): 15 ug via RESPIRATORY_TRACT
  Filled 2023-09-17 (×8): qty 2

## 2023-09-17 MED ORDER — ALPRAZOLAM 0.25 MG PO TABS
0.2500 mg | ORAL_TABLET | Freq: Every evening | ORAL | Status: DC | PRN
  Administered 2023-09-17: 0.25 mg via ORAL
  Filled 2023-09-17: qty 1

## 2023-09-17 MED ORDER — DOXYCYCLINE HYCLATE 100 MG PO TABS
100.0000 mg | ORAL_TABLET | Freq: Two times a day (BID) | ORAL | Status: DC
  Administered 2023-09-17 – 2023-09-21 (×8): 100 mg via ORAL
  Filled 2023-09-17 (×8): qty 1

## 2023-09-17 NOTE — Progress Notes (Signed)
 PHARMACY - ANTICOAGULATION CONSULT NOTE  Pharmacy Consult for IV heparin Indication: chest pain/ACS  Allergies  Allergen Reactions   Aspirin Other (See Comments)    Cannot take due to stomach ulcer   Mushroom Extract Complex (Obsolete) Other (See Comments)    Dizziness     Patient Measurements: Height: 5\' 4"  (162.6 cm) Weight: 83 kg (183 lb) IBW/kg (Calculated) : 54.7 HEPARIN DW (KG): 72.8  Vital Signs: Temp: 97.6 F (36.4 C) (04/09 0314) Temp Source: Oral (04/09 0314) BP: 146/88 (04/09 0314) Pulse Rate: 89 (04/09 0314)  Labs: Recent Labs    09/16/23 0304 09/16/23 0510 09/16/23 1610 09/16/23 0939 09/16/23 1217 09/16/23 1217 09/16/23 1811 09/16/23 2316 09/17/23 0407  HGB 13.8  --   --   --   --   --   --   --  13.0  HCT 44.1  --   --   --   --   --   --   --  41.9  PLT 283  --   --   --   --   --   --   --  270  HEPARINUNFRC  --   --   --   --  0.26*   < > 0.29* 0.51 0.68  CREATININE 0.79  --   --   --   --   --   --   --  0.67  TROPONINIHS 400*   < > 1,249* 1,333* 1,250*  --   --   --   --    < > = values in this interval not displayed.    Estimated Creatinine Clearance: 76 mL/min (by C-G formula based on SCr of 0.67 mg/dL).   Medical History: Past Medical History:  Diagnosis Date   Arthritis    COPD (chronic obstructive pulmonary disease) (HCC)    Ulcer     Assessment: Erin Hobbs is a 63 y.o. year old female admitted on 09/16/2023 with concern for ACS. No anticoagulation prior to admission on dispense history. Pharmacy consulted to dose heparin.  HL therapeutic at 0.68 CBC WNL  Goal of Therapy:  Heparin level 0.3-0.7 units/ml Monitor platelets by anticoagulation protocol: Yes   Plan:  Continue heparin infusion at 1100 units/hr Recheck heparin level daily Continue to monitor H&H and platelets Possible cath once respiratory status improves  Thank you for allowing pharmacy to participate in this patient's care.  Judeth Cornfield,  PharmD Clinical Pharmacist 09/17/2023 9:02 AM

## 2023-09-17 NOTE — Progress Notes (Signed)
 PROGRESS NOTE   Erin Hobbs  ZOX:[id number] DOB: November 19, 1960 DOA: 09/16/2023 PCP: Jamey Reas, PA-C   Chief Complaint  Patient presents with   Shortness of Breath   Chest Pain   Level of care: Telemetry  Brief Admission History:  63 y.o. female with medical history significant for COPD with chronic hypoxemic respiratory failure and tobacco abuse who presented to the ED for worsening shortness of breath and cough.  She denied any chest pain, but does claim to have worsening shortness of breath in the last 2 days that appears to be related to exertion.  She does describe some mild chest pressure and has had some radiation to her fingertips on the left side.  She denies any fevers or chills, but has had some cough with whitish sputum noted.  Usually wears 2 L nasal cannula at home.  She has been trying to quit smoking, but currently smokes less than half a pack per day.   ED Course: Vital signs with some mild tachycardia, but otherwise stable.  Leukocytosis of 13,000 noted.  Troponin elevation over 1200 currently noted and EKG with no significant findings aside from sinus tachycardia.  Chest x-ray with findings of COPD noted.  Started on heparin infusion and cardiology consulted.   Assessment and Plan:  Acute on chronic respiratory failure  Acute COPD exacerbation  -- severe exacerbation noted -- continue with high dose IV solumedrol and antibiotics -- added brovana, yupelri, pulmicort nebs to be scheduled  -- PRN duonebs ordered for breakthru symptoms  -- add mucinex, flutter valve -- changed antibiotic to doxycycline 100 mg BID  -- pulmonary toilet   NSTEMI  -- troponin peaked at >1250 -- limited TTE with wall motion abnormalities  -- continue IV heparin infusion  -- cardiology team planning for cath when respiratory status improves  Newly diagnosed cardiomyopathy -- TTE with findings of moderately depressed LVEF  -- pt started on entresto   GERD -- pantoprazole for  GI protection ordered  Tobacco use -- counseled on cessation  -- pt declines nicotine patch   DVT prophylaxis: IV heparin infusion  Code Status: Full  Family Communication:  Disposition: pending transfer to [facility] for cath  Consultants:  Cardiology  Procedures:   Antimicrobials:    Subjective: Pt reports slightly improved breathing, no chest pressure or pain, not back to respiratory baseline.  Pt says she did better with her handheld bronchodilator   Objective: Vitals:   09/17/23 0805 09/17/23 0806 09/17/23 1157 09/17/23 1508  BP:      Pulse:      Resp:      Temp:      TempSrc:      SpO2: 96% 98% 97% 95%  Weight:      Height:        Intake/Output Summary (Last 24 hours) at 09/17/2023 1520 Last data filed at 09/17/2023 0400 Gross per 24 hour  Intake 504.87 ml  Output --  Net 504.87 ml   Filed Weights   09/16/23 0318  Weight: 83 kg   Examination:  General exam: Appears calm and comfortable  Respiratory system: diffuse inspiratory/expiratory wheezing, coughing, rales heard.  Cardiovascular system: normal S1 & S2 heard. No JVD, murmurs, rubs, gallops or clicks. No pedal edema. Gastrointestinal system: Abdomen is nondistended, soft and nontender. No organomegaly or masses felt. Normal bowel sounds heard. Central nervous system: Alert and oriented. No focal neurological deficits. Extremities: Symmetric 5 x 5 power. Skin: No rashes, lesions or ulcers. Psychiatry: Judgement and insight  appear normal. Mood & affect appropriate.   Data Reviewed: I have personally reviewed following labs and imaging studies  CBC: Recent Labs  Lab 09/16/23 0304 09/17/23 0407  WBC 13.4* 11.8*  NEUTROABS 8.5*  --   HGB 13.8 13.0  HCT 44.1 41.9  MCV 90.6 90.9  PLT 283 270    Basic Metabolic Panel: Recent Labs  Lab 09/16/23 0304 09/17/23 0407  NA 140 137  K 3.9 4.4  CL 103 101  CO2 26 28  GLUCOSE 110* 152*  BUN 18 17  CREATININE 0.79 0.67  CALCIUM 8.6* 8.7*  MG  --  2.8*     CBG: No results for input(s): "GLUCAP" in the last 168 hours.  Recent Results (from the past 240 hours)  Resp panel by RT-PCR (RSV, Flu A&B, Covid) Anterior Nasal Swab     Status: None   Collection Time: 09/16/23  3:27 AM   Specimen: Anterior Nasal Swab  Result Value Ref Range Status   SARS Coronavirus 2 by RT PCR NEGATIVE NEGATIVE Final    Comment: (NOTE) SARS-CoV-2 target nucleic acids are NOT DETECTED.  The SARS-CoV-2 RNA is generally detectable in upper respiratory specimens during the acute phase of infection. The lowest concentration of SARS-CoV-2 viral copies this assay can detect is 138 copies/mL. A negative result does not preclude SARS-Cov-2 infection and should not be used as the sole basis for treatment or other patient management decisions. A negative result may occur with  improper specimen collection/handling, submission of specimen other than nasopharyngeal swab, presence of viral mutation(s) within the areas targeted by this assay, and inadequate number of viral copies(<138 copies/mL). A negative result must be combined with clinical observations, patient history, and epidemiological information. The expected result is Negative.  Fact Sheet for Patients:  BloggerCourse.com  Fact Sheet for Healthcare Providers:  SeriousBroker.it  This test is no t yet approved or cleared by the [place] FDA and  has been authorized for detection and/or diagnosis of SARS-CoV-2 by FDA under an Emergency Use Authorization (EUA). This EUA will remain  in effect (meaning this test can be used) for the duration of the COVID-19 declaration under Section 564(b)(1) of the Act, 21 U.S.C.section 360bbb-3(b)(1), unless the authorization is terminated  or revoked sooner.       Influenza A by PCR NEGATIVE NEGATIVE Final   Influenza B by PCR NEGATIVE NEGATIVE Final    Comment: (NOTE) The Xpert Xpress SARS-CoV-2/FLU/RSV plus assay  is intended as an aid in the diagnosis of influenza from Nasopharyngeal swab specimens and should not be used as a sole basis for treatment. Nasal washings and aspirates are unacceptable for Xpert Xpress SARS-CoV-2/FLU/RSV testing.  Fact Sheet for Patients: BloggerCourse.com  Fact Sheet for Healthcare Providers: SeriousBroker.it  This test is not yet approved or cleared by the [place] FDA and has been authorized for detection and/or diagnosis of SARS-CoV-2 by FDA under an Emergency Use Authorization (EUA). This EUA will remain in effect (meaning this test can be used) for the duration of the COVID-19 declaration under Section 564(b)(1) of the Act, 21 U.S.C. section 360bbb-3(b)(1), unless the authorization is terminated or revoked.     Resp Syncytial Virus by PCR NEGATIVE NEGATIVE Final    Comment: (NOTE) Fact Sheet for Patients: BloggerCourse.com  Fact Sheet for Healthcare Providers: SeriousBroker.it  This test is not yet approved or cleared by the [place] FDA and has been authorized for detection and/or diagnosis of SARS-CoV-2 by FDA under an Emergency Use Authorization (EUA). This  EUA will remain in effect (meaning this test can be used) for the duration of the COVID-19 declaration under Section 564(b)(1) of the Act, 21 U.S.C. section 360bbb-3(b)(1), unless the authorization is terminated or revoked.  Performed at [facility], [address], [place], [place] [address]      Radiology Studies: ECHOCARDIOGRAM COMPLETE Result Date: 09/16/2023    ECHOCARDIOGRAM REPORT   Patient Name:   Cierria Sherryll Burger Date of Exam: 09/16/2023 Medical Rec #:  [id number]       Height:       64.0 in Accession #:    [id number]      Weight:       183.0 lb Date of Birth:  [birth year]       BSA:          1.884 m Patient Age:    62 years        BP:           141/94 mmHg Patient Gender: F                HR:           93 bpm. [facility] Procedure: 2D Echo, Cardiac Doppler, Color Doppler and Intracardiac            Opacification Agent (Both Spectral and Color Flow Doppler were            utilized during procedure). Indications:    Elevated troponin  History:        Patient has no prior history of Echocardiogram examinations.                 COPD.  Sonographer:    Vern Claude Referring Phys: [id number] Basilio Cairo  Sonographer Comments: Image acquisition challenging due to patient body habitus and Image acquisition challenging due to respiratory motion. IMPRESSIONS  1. Poor acoustic windows limit study, even with Definity. LVEF is moderately depressed with hypokinesis of the distal lateral, mid/distal septal, distal anterior and apical walls.  2. Right ventricular systolic function is normal. The right ventricular size is normal.  3. The mitral valve is grossly normal. No evidence of mitral valve regurgitation.  4. The aortic valve was not well visualized. Aortic valve regurgitation is not visualized. FINDINGS  Left Ventricle: Poor acoustic windows limit study, even with Definity. LVEF is moderately depressed with hypokinesis of the distal lateral, mid/distal septal, distal anterior and apical walls. The left ventricular internal cavity size was normal in size. There is no left ventricular hypertrophy. Right Ventricle: The right ventricular size is normal. Right vetricular wall thickness was not assessed. Right ventricular systolic function is normal. Left Atrium: Left atrial size was not well visualized. Right Atrium: Right atrial size was normal in size. Pericardium: There is no evidence of pericardial effusion. Mitral Valve: The mitral valve is grossly normal. No evidence of mitral valve regurgitation. MV peak gradient, 4.1 mmHg. The mean mitral valve gradient is 2.0 mmHg. Tricuspid Valve: The tricuspid valve is normal in structure. Tricuspid valve regurgitation is not demonstrated. Aortic  Valve: The aortic valve was not well visualized. Aortic valve regurgitation is not visualized. Aortic valve mean gradient measures 3.0 mmHg. Aortic valve peak gradient measures 5.2 mmHg. Aortic valve area, by VTI measures 2.15 cm. Pulmonic Valve: The pulmonic valve was not well visualized. Aorta: The aortic root was not well visualized.  LEFT VENTRICLE PLAX 2D LVIDd:         4.10 cm      Diastology  LVIDs:         2.80 cm      LV e' medial:    9.68 cm/s LV PW:         0.90 cm      LV E/e' medial:  7.4 LV IVS:        0.70 cm      LV e' lateral:   11.20 cm/s LVOT diam:     2.00 cm      LV E/e' lateral: 6.4 LV SV:         39 LV SV Index:   21 LVOT Area:     3.14 cm  LV Volumes (MOD) LV vol d, MOD A4C: 149.0 ml LV vol s, MOD A4C: 66.0 ml LV SV MOD A4C:     149.0 ml RIGHT VENTRICLE             IVC RV Basal diam:  2.90 cm     IVC diam: 1.70 cm RV Mid diam:    2.20 cm RV S prime:     13.60 cm/s TAPSE (M-mode): 1.8 cm LEFT ATRIUM             Index        RIGHT ATRIUM          Index LA diam:        3.30 cm 1.75 cm/m   RA Area:     5.16 cm LA Vol (A2C):   19.8 ml 10.51 ml/m  RA Volume:   6.65 ml  3.53 ml/m LA Vol (A4C):   14.7 ml 7.80 ml/m LA Biplane Vol: 19.0 ml 10.09 ml/m  AORTIC VALVE                    PULMONIC VALVE AV Area (Vmax):    2.36 cm     PV Vmax:       0.99 m/s AV Area (Vmean):   2.09 cm     PV Peak grad:  3.9 mmHg AV Area (VTI):     2.15 cm AV Vmax:           114.00 cm/s AV Vmean:          76.700 cm/s AV VTI:            0.181 m AV Peak Grad:      5.2 mmHg AV Mean Grad:      3.0 mmHg LVOT Vmax:         85.50 cm/s LVOT Vmean:        51.000 cm/s LVOT VTI:          0.124 m LVOT/AV VTI ratio: 0.69  AORTA Ao Root diam: 3.20 cm Ao Asc diam:  2.60 cm MITRAL VALVE MV Area (PHT): 5.75 cm    SHUNTS MV Area VTI:   1.94 cm    Systemic VTI:  0.12 m MV Peak grad:  4.1 mmHg    Systemic Diam: 2.00 cm MV Mean grad:  2.0 mmHg MV Vmax:       1.01 m/s MV Vmean:      71.9 cm/s MV Decel Time: 132 msec MV E velocity: 71.70  cm/s MV A velocity: 74.40 cm/s MV E/A ratio:  0.96 Dietrich Pates MD Electronically signed by Dietrich Pates MD Signature Date/Time: 09/16/2023/5:31:58 PM    Final    DG Chest Portable 1 View Result Date: 09/16/2023 CLINICAL DATA:  Cough EXAM: PORTABLE CHEST 1 VIEW COMPARISON:  08/16/2023 FINDINGS: Hyperinflation and apical lucency. There is  no edema, consolidation, effusion, or pneumothorax. Artifact from EKG leads. Normal heart size and mediastinal contours. IMPRESSION: COPD without acute superimposed finding. Electronically Signed   By: Tiburcio Pea M.D.   On: 09/16/2023 04:38    Scheduled Meds:  arformoterol  15 mcg Nebulization BID   aspirin EC  81 mg Oral Daily   budesonide (PULMICORT) nebulizer solution  0.25 mg Nebulization BID   methylPREDNISolone (SOLU-MEDROL) injection  80 mg Intravenous Q12H   nicotine  21 mg Transdermal Daily   pantoprazole (PROTONIX) IV  40 mg Intravenous Q24H   revefenacin  175 mcg Nebulization Daily   rosuvastatin  20 mg Oral Daily   sacubitril-valsartan  1 tablet Oral BID   Continuous Infusions:  azithromycin 500 mg (09/17/23 0916)   heparin 1,100 Units/hr (09/17/23 0340)     LOS: 0 days   Time spent: 53 mins  Malka Bocek Laural Benes, MD How to contact the [facility] Attending or Consulting provider 7A - 7P or covering provider during after hours 7P -7A, for this patient?  Check the care team in [facility] and look for a) attending/consulting [facility] provider listed and b) the [facility] team listed Log into [email or web address] to find provider on call.  Locate the [facility] provider you are looking for under [facility] and page to a number that you can be directly reached. If you still have difficulty reaching the provider, please page the [facility] (Director on Call) for the Hospitalists listed on amion for assistance.  09/17/2023, 3:20 PM

## 2023-09-17 NOTE — Plan of Care (Signed)
 Pt is alert and oriented x4. Up 1 assist to bedside commode. Activity limited due to labored breathing. Pt c/o of cough causing increased shortness of breath. Pt requested tessalon pearles. MD notified and was ordered and given. Pt reported improved breathing and cough. Pt received xanax scheduled for anxiety.  Due to shortness of breath pt has increased anxiety.  Problem: Clinical Measurements: Goal: Respiratory complications will improve Outcome: Not Progressing   Problem: Activity: Goal: Risk for activity intolerance will decrease Outcome: Not Progressing   Problem: Coping: Goal: Level of anxiety will decrease Outcome: Not Progressing   Problem: Education: Goal: Knowledge of General Education information will improve Description: Including pain rating scale, medication(s)/side effects and non-pharmacologic comfort measures Outcome: Progressing   Problem: Health Behavior/Discharge Planning: Goal: Ability to manage health-related needs will improve Outcome: Progressing   Problem: Clinical Measurements: Goal: Ability to maintain clinical measurements within normal limits will improve Outcome: Progressing Goal: Will remain free from infection Outcome: Progressing Goal: Diagnostic test results will improve Outcome: Progressing Goal: Cardiovascular complication will be avoided Outcome: Progressing   Problem: Nutrition: Goal: Adequate nutrition will be maintained Outcome: Progressing   Problem: Elimination: Goal: Will not experience complications related to bowel motility Outcome: Progressing Goal: Will not experience complications related to urinary retention Outcome: Progressing   Problem: Pain Managment: Goal: General experience of comfort will improve and/or be controlled Outcome: Progressing   Problem: Safety: Goal: Ability to remain free from injury will improve Outcome: Progressing   Problem: Skin Integrity: Goal: Risk for impaired skin integrity will  decrease Outcome: Progressing

## 2023-09-17 NOTE — Progress Notes (Signed)
 PHARMACY - ANTICOAGULATION Pharmacy Consult for heparin Indication: chest pain/ACS Brief A/P: Heparin level within goal range Continue Heparin at current rate   Allergies  Allergen Reactions   Aspirin Other (See Comments)    Cannot take due to stomach ulcer   Mushroom Extract Complex (Obsolete) Other (See Comments)    Dizziness    Patient Measurements: Height: 5\' 4"  (162.6 cm) Weight: 83 kg (183 lb) IBW/kg (Calculated) : 54.7 HEPARIN DW (KG): 72.8  Vital Signs: Temp: 98.2 F (36.8 C) (04/08 1923) Temp Source: Oral (04/08 1923) BP: 144/80 (04/08 1923) Pulse Rate: 97 (04/08 1923)  Labs: Recent Labs    09/16/23 0304 09/16/23 0510 09/16/23 1610 09/16/23 0939 09/16/23 1217 09/16/23 1811 09/16/23 2316  HGB 13.8  --   --   --   --   --   --   HCT 44.1  --   --   --   --   --   --   PLT 283  --   --   --   --   --   --   HEPARINUNFRC  --   --   --   --  0.26* 0.29* 0.51  CREATININE 0.79  --   --   --   --   --   --   TROPONINIHS 400*   < > 1,249* 1,333* 1,250*  --   --    < > = values in this interval not displayed.   Estimated Creatinine Clearance: 76 mL/min (by C-G formula based on SCr of 0.79 mg/dL).  Assessment: 63 y.o. female with NSTEMI for heparin  Goal of Therapy:  Heparin level 0.3-0.7 units/ml Monitor platelets by anticoagulation protocol: Yes   Plan:  No change to heparin  Follow-up am labs.  Geannie Risen, PharmD, BCPS

## 2023-09-17 NOTE — Plan of Care (Signed)
  Problem: Education: Goal: Knowledge of General Education information will improve Description: Including pain rating scale, medication(s)/side effects and non-pharmacologic comfort measures Outcome: Progressing   Problem: Health Behavior/Discharge Planning: Goal: Ability to manage health-related needs will improve Outcome: Progressing   Problem: Clinical Measurements: Goal: Respiratory complications will improve Outcome: Not Progressing   Problem: Coping: Goal: Level of anxiety will decrease Outcome: Not Progressing

## 2023-09-17 NOTE — Progress Notes (Addendum)
 Rounding Note    Patient Name: Erin Hobbs Date of Encounter: 09/17/2023  [facility] Cardiologist: Dietrich Pates, MD   Subjective   This am, reports some improvement with SOB after breathing treatments. Noted that previous hospitalization, she saw improvement in SOB by using inhalers in combination to hospital treatment. Reports intermittent CP tightness at rest, 4/10, lasting seconds, located in the midsternum. Currently no chest pain.   Inpatient Medications    Scheduled Meds:  ALPRAZolam  0.5 mg Oral QHS   budesonide (PULMICORT) nebulizer solution  0.25 mg Nebulization BID   levalbuterol  0.63 mg Nebulization Q6H   methylPREDNISolone (SOLU-MEDROL) injection  80 mg Intravenous Q12H   nicotine  21 mg Transdermal Daily   pantoprazole (PROTONIX) IV  40 mg Intravenous Q24H   Continuous Infusions:  azithromycin 500 mg (09/17/23 0916)   heparin 1,100 Units/hr (09/17/23 0340)   PRN Meds: acetaminophen **OR** acetaminophen, benzonatate, HYDROcodone-acetaminophen, levalbuterol, nitroGLYCERIN, ondansetron **OR** ondansetron (ZOFRAN) IV, mouth rinse   Vital Signs    Vitals:   09/17/23 0314 09/17/23 0333 09/17/23 0805 09/17/23 0806  BP: (!) 146/88     Pulse: 89     Resp: (!) 22     Temp: 97.6 F (36.4 C)     TempSrc: Oral     SpO2: 98% 94% 96% 98%  Weight:      Height:        Intake/Output Summary (Last 24 hours) at 09/17/2023 1037 Last data filed at 09/17/2023 0400 Gross per 24 hour  Intake 504.87 ml  Output --  Net 504.87 ml      09/16/2023    3:18 AM 08/16/2023    9:20 AM 09/04/2022    3:02 PM  Last 3 Weights  Weight (lbs) 183 lb 182 lb 175 lb 14.8 oz  Weight (kg) 83.008 kg 82.555 kg 79.8 kg      Telemetry    NSR 80-100's with PVC - Personally Reviewed  Physical Exam   GEN: Laying in bed with mild acute distress.  RR 24-26 Neck: No JVD Cardiac: tachycardiac, distant heart sounds, difficult to auscultate with wheezing.  Respiratory: Some  improvements since yesterday. Audible Wheezing. Wheezing and Crackles throughout  GI: Soft, nontender, non-distended  MS: No edema; No deformity. Neuro:  Nonfocal  Psych: Normal affect   Labs    High Sensitivity Troponin:   Recent Labs  Lab 09/16/23 0304 09/16/23 0510 09/16/23 0712 09/16/23 0939 09/16/23 1217  TROPONINIHS 400* 1,180* 1,249* 1,333* 1,250*     Chemistry Recent Labs  Lab 09/16/23 0304 09/17/23 0407  NA 140 137  K 3.9 4.4  CL 103 101  CO2 26 28  GLUCOSE 110* 152*  BUN 18 17  CREATININE 0.79 0.67  CALCIUM 8.6* 8.7*  MG  --  2.8*  GFRNONAA >60 >60  ANIONGAP 11 8    Lipids  Recent Labs  Lab 09/16/23 0939  CHOL 170  TRIG 38  HDL 66  LDLCALC 96  CHOLHDL 2.6    Hematology Recent Labs  Lab 09/16/23 0304 09/17/23 0407  WBC 13.4* 11.8*  RBC 4.87 4.61  HGB 13.8 13.0  HCT 44.1 41.9  MCV 90.6 90.9  MCH 28.3 28.2  MCHC 31.3 31.0  RDW 14.0 14.5  PLT 283 270   BNP Recent Labs  Lab 09/16/23 0939  BNP 227.0*     Radiology    DG Chest Portable 1 View Result Date: 09/16/2023 CLINICAL DATA:  Cough EXAM: PORTABLE CHEST 1 VIEW COMPARISON:  08/16/2023 FINDINGS: Hyperinflation and apical lucency. There is no edema, consolidation, effusion, or pneumothorax. Artifact from EKG leads. Normal heart size and mediastinal contours. IMPRESSION: COPD without acute superimposed finding. Electronically Signed   By: Tiburcio Pea M.D.   On: 09/16/2023 04:38    Cardiac Studies    ECHO IMPRESSIONS 09/16/23  1. Poor acoustic windows limit study, even with Definity. LVEF is  moderately depressed with hypokinesis of the distal lateral, mid/distal  septal, distal anterior and apical walls.   2. Right ventricular systolic function is normal. The right ventricular  size is normal.   3. The mitral valve is grossly normal. No evidence of mitral valve  regurgitation.   4. The aortic valve was not well visualized. Aortic valve regurgitation  is not visualized.    FINDINGS   Left Ventricle: Poor acoustic windows limit study, even with Definity.  LVEF is moderately depressed with hypokinesis of the distal lateral,  mid/distal septal, distal anterior and apical walls. The left ventricular  internal cavity size was normal in  size. There is no left ventricular hypertrophy.   Right Ventricle: The right ventricular size is normal. Right vetricular  wall thickness was not assessed. Right ventricular systolic function is  normal.   Left Atrium: Left atrial size was not well visualized.   Right Atrium: Right atrial size was normal in size.   Pericardium: There is no evidence of pericardial effusion.   Mitral Valve: The mitral valve is grossly normal. No evidence of mitral  valve regurgitation. MV peak gradient, 4.1 mmHg. The mean mitral valve  gradient is 2.0 mmHg.   Tricuspid Valve: The tricuspid valve is normal in structure. Tricuspid  valve regurgitation is not demonstrated.   Aortic Valve: The aortic valve was not well visualized. Aortic valve  regurgitation is not visualized. Aortic valve mean gradient measures 3.0  mmHg. Aortic valve peak gradient measures 5.2 mmHg. Aortic valve area, by  VTI measures 2.15 cm.   Pulmonic Valve: The pulmonic valve was not well visualized.   Aorta: The aortic root was not well visualized.   Patient Profile     63 y.o. female with a hx of COPD (on home O2), GERD, tobacco use who is being seen 09/16/2023 for the evaluation of elevated troponin at the request of Dr. Thomes Dinning.   Assessment & Plan    Elevated troponin/NSTEMI Reported brief, intermittent CP episodes x 1 week described as crampy and pressure, 4/10, lasting seconds, exacerbated by stress, nonexertional, not pleuritic, associated with numbness in fingers and face. - HsTn 40>1180>149>1333>1250.  EKG with no ischemic changes.  LDL 96.  A1c 5.9.  Ordered repeat troponin to confirm trending down - echo 09/16/2023: Limited study with poor acoustic  windows.  LVEF moderately depressed with hypokinesis (distal lateral, mid/distal septal, distal anterior and apical walls), normal RV function and size - this am, reports intermittent CP as described above throughout night. Currently chest pain free.  - Currently on IV heparin, Refused ASA 81 mg yesterday.  - With ASCVD 8.1% and LDL was 96, ordered Crestor 20 mg. - Once RR status improve, would consider bisoprolol given PVC and cardiomyopathy. - Concerned for cardiac cause with elevated Tn > 1000, + WMA above, Tobacco use and FHx of CAD. Once her pulmonary status improves, will consider ischemic evaluation. Most likely cath, will discuss with MD. Patient is unable to lie flat chronically.  COPD exacerbation History of COPD x 4 to 5 years that is currently treated with Symbicort, Combivent and Ventolin.  Reported shortness of breath worsened x 2 days along with orthopnea and edema.  Home O2 usually 2L [place] but increased to 4L currently.  CBC with elevated WBC 13.4, neutrophils 8.5, monocytes 1.7.  Negative respiratory panel.  Procalcitonin WNL - Reports mild improvement in SOB, however, RR 24-26 and audible wheezing.  - Continue with steroids, nebs, mag and azithromycin - Messaged hospitalist about home inhaler use.  New Cardiomyopathy  Edema Reported edema x 1 week in extremities not relieved with elevation - BNP 227 - Euvolemic on exam - ECHO as noted above. Add GDMT after cath. Consider SGLTi2 post cath.   PVC - Telemetry: Nsr 80-100's with PVC's  - Electrolytes WNL. K 4.4, Mg 2.8  - Once RR status improve, would consider bisoprolol given PVC and cardiomyopathy.   For questions or updates, please contact [facility] Please consult [email or web address] for contact info under      Signed, Basilio Cairo, PA-C  09/17/2023, 10:37 AM      Attending note: Patient seen and examined.  I reviewed the chart including evaluation by Dr. Tenny Craw.  Agree with above assessment by Ms. Dunlap  PA-C.  Breathing status gradually improving with COPD exacerbation, although not at baseline.  She remains on steroids, nebulizer treatments, and antibiotics.  She has evidence of cardiomyopathy in the setting of NSTEMI, limited images by echocardiogram demonstrating moderate LV dysfunction with wall motion abnormalities suggesting ischemic etiology.  Peak high-sensitivity troponin I of 1333 also supports this.  She is afebrile.  Systolic 140s.  Prolonged expiratory phase with wheeze noted.  Cardiac exam with RRR and no gallop.  Pertinent lab work includes potassium 4.4, BUN 17, creatinine 0.67, high-sensitivity troponin I down to 679, hemoglobin 13, platelets 270.  Continue Crestor 20 mg daily and IV heparin.  Try and continue coated aspirin 81 mg daily (prior reported intolerance due to stomach ulcer).  Anticipate diagnostic cardiac catheterization once respiratory status has stabilized.  Start Entresto 24/26 mg twice daily.  Eventually add bisoprolol.  Jonelle Sidle, M.D., F.A.C.C.

## 2023-09-17 NOTE — Hospital Course (Signed)
 63 y.o. female with medical history significant for COPD with chronic hypoxemic respiratory failure and tobacco abuse who presented to the ED for worsening shortness of breath and cough.  She denied any chest pain, but does claim to have worsening shortness of breath in the last 2 days that appears to be related to exertion.  She does describe some mild chest pressure and has had some radiation to her fingertips on the left side.  She denies any fevers or chills, but has had some cough with whitish sputum noted.  Usually wears 2 L nasal cannula at home.  She has been trying to quit smoking, but currently smokes less than half a pack per day.   ED Course: Vital signs with some mild tachycardia, but otherwise stable.  Leukocytosis of 13,000 noted.  Troponin elevation over 1200 currently noted and EKG with no significant findings aside from sinus tachycardia.  Chest x-ray with findings of COPD noted.  Started on heparin infusion and cardiology consulted.

## 2023-09-18 ENCOUNTER — Ambulatory Visit: Admitting: Internal Medicine

## 2023-09-18 DIAGNOSIS — I214 Non-ST elevation (NSTEMI) myocardial infarction: Secondary | ICD-10-CM | POA: Diagnosis not present

## 2023-09-18 LAB — BASIC METABOLIC PANEL WITH GFR
Anion gap: 6 (ref 5–15)
BUN: 21 mg/dL (ref 8–23)
CO2: 32 mmol/L (ref 22–32)
Calcium: 8.5 mg/dL — ABNORMAL LOW (ref 8.9–10.3)
Chloride: 98 mmol/L (ref 98–111)
Creatinine, Ser: 0.67 mg/dL (ref 0.44–1.00)
GFR, Estimated: >60 mL/min (ref >60)
Glucose, Bld: 204 mg/dL — ABNORMAL HIGH (ref 70–99)
Potassium: 4.3 mmol/L (ref 3.5–5.1)
Sodium: 136 mmol/L (ref 135–145)

## 2023-09-18 LAB — CBC
HCT: 45 % (ref 36.0–46.0)
Hemoglobin: 14.2 g/dL (ref 12.0–15.0)
MCH: 28.6 pg (ref 26.0–34.0)
MCHC: 31.6 g/dL (ref 30.0–36.0)
MCV: 90.5 fL (ref 80.0–100.0)
Platelets: 290 10*3/uL (ref 150–400)
RBC: 4.97 MIL/uL (ref 3.87–5.11)
RDW: 14.1 % (ref 11.5–15.5)
WBC: 16.7 10*3/uL — ABNORMAL HIGH (ref 4.0–10.5)
nRBC: 0 % (ref 0.0–0.2)

## 2023-09-18 LAB — HEPARIN LEVEL (UNFRACTIONATED)
Heparin Unfractionated: >1.1 [IU]/mL — ABNORMAL HIGH (ref 0.30–0.70)
Heparin Unfractionated: 0.55 [IU]/mL (ref 0.30–0.70)
Heparin Unfractionated: 0.5 [IU]/mL (ref 0.30–0.70)

## 2023-09-18 MED ORDER — FLUCONAZOLE 150 MG PO TABS
150.0000 mg | ORAL_TABLET | Freq: Once | ORAL | Status: AC
  Administered 2023-09-18: 150 mg via ORAL
  Filled 2023-09-18: qty 1

## 2023-09-18 MED ORDER — ALUM & MAG HYDROXIDE-SIMETH 200-200-20 MG/5ML PO SUSP
30.0000 mL | Freq: Once | ORAL | Status: AC
  Administered 2023-09-18: 30 mL via ORAL
  Filled 2023-09-18: qty 30

## 2023-09-18 MED ORDER — SPIRONOLACTONE 12.5 MG HALF TABLET
12.5000 mg | ORAL_TABLET | Freq: Every day | ORAL | Status: DC
  Administered 2023-09-18 – 2023-09-21 (×4): 12.5 mg via ORAL
  Filled 2023-09-18 (×4): qty 1

## 2023-09-18 MED ORDER — NYSTATIN 100000 UNIT/ML MT SUSP
5.0000 mL | Freq: Four times a day (QID) | OROMUCOSAL | Status: DC
  Administered 2023-09-18 – 2023-09-21 (×11): 500000 [IU] via OROMUCOSAL
  Filled 2023-09-18 (×11): qty 5

## 2023-09-18 MED ORDER — ASPIRIN 81 MG PO CHEW: 81.0000 mg | CHEWABLE_TABLET | ORAL | Status: DC

## 2023-09-18 MED ORDER — PANTOPRAZOLE SODIUM 40 MG PO TBEC
40.0000 mg | DELAYED_RELEASE_TABLET | Freq: Two times a day (BID) | ORAL | Status: DC
  Administered 2023-09-18 – 2023-09-21 (×7): 40 mg via ORAL
  Filled 2023-09-18 (×7): qty 1

## 2023-09-18 MED ORDER — METHYLPREDNISOLONE SODIUM SUCC 40 MG IJ SOLR
40.0000 mg | Freq: Two times a day (BID) | INTRAMUSCULAR | Status: DC
  Administered 2023-09-18 – 2023-09-21 (×7): 40 mg via INTRAVENOUS
  Filled 2023-09-18 (×7): qty 1

## 2023-09-18 MED ORDER — SODIUM CHLORIDE 0.9 % IV SOLN: INTRAVENOUS | Status: DC

## 2023-09-18 MED ORDER — FAMOTIDINE 20 MG PO TABS
20.0000 mg | ORAL_TABLET | Freq: Once | ORAL | Status: AC
  Administered 2023-09-18: 20 mg via ORAL
  Filled 2023-09-18: qty 1

## 2023-09-18 MED ORDER — HEPARIN (PORCINE) 25000 UT/250ML-% IV SOLN
950.0000 [IU]/h | INTRAVENOUS | Status: DC
  Administered 2023-09-18 – 2023-09-19 (×2): 950 [IU]/h via INTRAVENOUS
  Filled 2023-09-18: qty 250

## 2023-09-18 NOTE — Plan of Care (Signed)
  Problem: Education: Goal: Knowledge of General Education information will improve Description: Including pain rating scale, medication(s)/side effects and non-pharmacologic comfort measures Outcome: Progressing   Problem: Clinical Measurements: Goal: Ability to maintain clinical measurements within normal limits will improve Outcome: Not Progressing Note: Pt is still sob with extreme doe. Pt currently on 5 L via  Goal: Will remain free from infection Outcome: Progressing

## 2023-09-18 NOTE — Plan of Care (Signed)

## 2023-09-18 NOTE — H&P (View-Only) (Signed)
 Rounding Note    Patient Name: Erin Hobbs Date of Encounter: 09/18/2023  [facility] Cardiologist: Dietrich Pates, MD   Subjective   This am, actively receiving breathing treatment. Reported significant improvement  in breathing since yesterday. Still reports intermittent CP tightness at rest, lower sternum, lasting seconds 4/10, however suspect related to ulcer. Currently no chest pain.  Inpatient Medications    Scheduled Meds:  arformoterol  15 mcg Nebulization BID   aspirin EC  81 mg Oral Daily   budesonide (PULMICORT) nebulizer solution  0.25 mg Nebulization BID   doxycycline  100 mg Oral Q12H   guaiFENesin  1,200 mg Oral BID   methylPREDNISolone (SOLU-MEDROL) injection  80 mg Intravenous Q12H   nicotine  21 mg Transdermal Daily   pantoprazole (PROTONIX) IV  40 mg Intravenous Q24H   revefenacin  175 mcg Nebulization Daily   rosuvastatin  20 mg Oral Daily   sacubitril-valsartan  1 tablet Oral BID   Continuous Infusions:  heparin     PRN Meds: acetaminophen **OR** acetaminophen, ALPRAZolam, benzonatate, HYDROcodone-acetaminophen, ipratropium-albuterol, nitroGLYCERIN, ondansetron **OR** ondansetron (ZOFRAN) IV, mouth rinse   Vital Signs    Vitals:   09/17/23 1934 09/17/23 2014 09/18/23 0259 09/18/23 0325  BP: (!) 152/83   (!) 157/99  Pulse: 85   94  Resp: 18   18  Temp: 97.9 F (36.6 C)   97.8 F (36.6 C)  TempSrc: Oral   Oral  SpO2: 99% 90% 99% 100%  Weight:      Height:        Intake/Output Summary (Last 24 hours) at 09/18/2023 0725 Last data filed at 09/17/2023 1918 Gross per 24 hour  Intake 250 ml  Output --  Net 250 ml      09/16/2023    3:18 AM 08/16/2023    9:20 AM 09/04/2022    3:02 PM  Last 3 Weights  Weight (lbs) 183 lb 182 lb 175 lb 14.8 oz  Weight (kg) 83.008 kg 82.555 kg 79.8 kg      Telemetry    NSR, HR 70-80's with baseline artifact  - Personally Reviewed  Physical Exam   GEN: Laying in bed actively receiving breathing  treatment, No acute distress. Appears to have improved Neck: No JVD Cardiac: RRR, no murmurs, rubs, or gallops.  Respiratory: mildly wheezing throughout, no rale or crackles GI: Soft, nontender, non-distended  MS: No edema; No deformity. Neuro:  Nonfocal  Psych: Normal affect   Labs    High Sensitivity Troponin:   Recent Labs  Lab 09/16/23 0510 09/16/23 0712 09/16/23 0939 09/16/23 1217 09/17/23 0407  TROPONINIHS 1,180* 1,249* 1,333* 1,250* 679*     Chemistry Recent Labs  Lab 09/16/23 0304 09/17/23 0407 09/18/23 0419  NA 140 137 136  K 3.9 4.4 4.3  CL 103 101 98  CO2 26 28 32  GLUCOSE 110* 152* 204*  BUN 18 17 21   CREATININE 0.79 0.67 0.67  CALCIUM 8.6* 8.7* 8.5*  MG  --  2.8*  --   GFRNONAA >60 >60 >60  ANIONGAP 11 8 6     Lipids  Recent Labs  Lab 09/16/23 0939  CHOL 170  TRIG 38  HDL 66  LDLCALC 96  CHOLHDL 2.6    Hematology Recent Labs  Lab 09/16/23 0304 09/17/23 0407 09/18/23 0419  WBC 13.4* 11.8* 16.7*  RBC 4.87 4.61 4.97  HGB 13.8 13.0 14.2  HCT 44.1 41.9 45.0  MCV 90.6 90.9 90.5  MCH 28.3 28.2 28.6  MCHC  31.3 31.0 31.6  RDW 14.0 14.5 14.1  PLT 283 270 290   BNP Recent Labs  Lab 09/16/23 0939  BNP 227.0*     Cardiac Studies   ECHO IMPRESSIONS 09/16/23  1. Poor acoustic windows limit study, even with Definity. LVEF is  moderately depressed with hypokinesis of the distal lateral, mid/distal  septal, distal anterior and apical walls.   2. Right ventricular systolic function is normal. The right ventricular  size is normal.   3. The mitral valve is grossly normal. No evidence of mitral valve  regurgitation.   4. The aortic valve was not well visualized. Aortic valve regurgitation  is not visualized.   FINDINGS   Left Ventricle: Poor acoustic windows limit study, even with Definity.  LVEF is moderately depressed with hypokinesis of the distal lateral,  mid/distal septal, distal anterior and apical walls. The left ventricular   internal cavity size was normal in  size. There is no left ventricular hypertrophy.   Right Ventricle: The right ventricular size is normal. Right vetricular  wall thickness was not assessed. Right ventricular systolic function is  normal.   Left Atrium: Left atrial size was not well visualized.   Right Atrium: Right atrial size was normal in size.   Pericardium: There is no evidence of pericardial effusion.   Mitral Valve: The mitral valve is grossly normal. No evidence of mitral  valve regurgitation. MV peak gradient, 4.1 mmHg. The mean mitral valve  gradient is 2.0 mmHg.   Tricuspid Valve: The tricuspid valve is normal in structure. Tricuspid  valve regurgitation is not demonstrated.   Aortic Valve: The aortic valve was not well visualized. Aortic valve  regurgitation is not visualized. Aortic valve mean gradient measures 3.0  mmHg. Aortic valve peak gradient measures 5.2 mmHg. Aortic valve area, by  VTI measures 2.15 cm.   Pulmonic Valve: The pulmonic valve was not well visualized.   Aorta: The aortic root was not well visualized.   Patient Profile     63 y.o. female with a hx of COPD (on home O2), GERD, tobacco use who is being seen 09/16/2023 for the evaluation of elevated troponin at the request of Dr. Thomes Dinning.   Assessment & Plan    Elevated troponin/NSTEMI Reported brief, intermittent CP episodes x 1 week described as crampy and pressure, 4/10, lasting seconds, exacerbated by stress, nonexertional, not pleuritic, associated with numbness in fingers and face. - HsTn 40>1180>149>1333>1250 >679.  EKG with no ischemic changes.  LDL 96.  A1c 5.9.  - echo 09/16/2023: Limited study with poor acoustic windows.  LVEF moderately depressed with hypokinesis (distal lateral, mid/distal septal, distal anterior and apical walls), normal RV function and size - this am, continues to reports intermittent CP. Currently chest pain free. Suspect CP is related to ulcer, however has been  receiving PPI without any changes.  - Currently on IV heparin, Refused ASA 81 mg due to ulcer  - With ASCVD 8.1% and LDL was 96, continue Crestor 20 mg. - Once RR status improve, would consider bisoprolol given PVC and cardiomyopathy. - Concerned for cardiac cause with elevated Tn > 1000, + WMA above, Tobacco use and FHx of CAD. Once her pulmonary status improves, planning for cath. Hospitalist already notified to set bed transfer to [facility]. Review with MD to add to cath board tomorrow.    COPD exacerbation History of COPD x 4 to 5 years that is currently treated with Symbicort, Combivent and Ventolin.  Reported shortness of breath worsened x 2  days along with orthopnea and edema.  Home O2 usually 2L [place] but increased to 4L currently.  CBC with elevated WBC 13.4, neutrophils 8.5, monocytes 1.7.  Negative respiratory panel.  Procalcitonin WNL - Actively receiving treatment this am. Reports significant improvement in SOB. Appears significantly better with RR 18 -20, however, mild wheezing still present. - Yesterday d/c Duoneb, Levalbuterol and started Brovana, Doxycycline, Mucinex, Yuperli. Continue new meds in addition to Pulmicort, steroids. Managed per admitting team.    New Cardiomyopathy  Edema Reported edema x 1 week in extremities not relieved with elevation - BNP 227 - Euvolemic on exam - yesterday started Entresto 24-26 mg BID - ECHO as noted above. Add GDMT after cath. Consider SGLTi2 post cath.    PVC - Telemetry: NSR, HR 70-80's - Electrolytes WNL. K 4.3, 4/9 Mg 2.8  - Once RR status improve, would consider bisoprolol given PVC and cardiomyopathy.     For questions or updates, please contact [facility] Please consult [email or web address] for contact info under        Signed, Basilio Cairo, PA-C  09/18/2023, 7:25 AM      Attending note:  Patient seen and examined.  Chart reviewed, agree with above assessment by Ms. Dunlap PA-C.  Ms. Brosious states that her breathing  continues to improve, intermittent cough and mild wheeze noted.  Reports occasional, more atypical sounding chest discomfort.  She is afebrile, heart rate 90s to low 100s in sinus rhythm by telemetry.  Systolic running 161W to 160s.  Lungs exhibit prolonged expiratory phase with mild expiratory wheeze.  Cardiac exam with RRR and no gallop.  Pertinent lab work includes potassium 4.3, creatinine 0.67 with GFR greater than 60, LDL 96, WBC 16.7 (steroids), hemoglobin 14.2, platelets 290.  NSTEMI, peak high-sensitivity troponin I 1333.  Newly documented cardiomyopathy with moderate LV dysfunction and wall motion abnormalities suggesting potential ischemic cardiomyopathy.  She is being treated for COPD exacerbation with improving respiratory status.  Awaits bed at [facility] for transfer on the hospitalist team, and ultimately diagnostic cardiac catheterization.  Continue aspirin 81 mg daily, Crestor 20 mg daily, started on Entresto 24/26 mg twice daily.  Hold off on bisoprolol for now.  Add Aldactone 12.5 mg daily.  Jonelle Sidle, M.D., F.A.C.C.

## 2023-09-18 NOTE — Progress Notes (Signed)
 PROGRESS NOTE   Erin Hobbs  ZOX:[id number] DOB: 11-23-1960 DOA: 09/16/2023 PCP: Jamey Reas, PA-C   Chief Complaint  Patient presents with   Shortness of Breath   Chest Pain   Level of care: Telemetry Cardiac  Brief Admission History:  63 y.o. female with medical history significant for COPD with chronic hypoxemic respiratory failure and tobacco abuse who presented to the ED for worsening shortness of breath and cough.  She denied any chest pain, but does claim to have worsening shortness of breath in the last 2 days that appears to be related to exertion.  She does describe some mild chest pressure and has had some radiation to her fingertips on the left side.  She denies any fevers or chills, but has had some cough with whitish sputum noted.  Usually wears 2 L nasal cannula at home.  She has been trying to quit smoking, but currently smokes less than half a pack per day.   ED Course: Vital signs with some mild tachycardia, but otherwise stable.  Leukocytosis of 13,000 noted.  Troponin elevation over 1200 currently noted and EKG with no significant findings aside from sinus tachycardia.  Chest x-ray with findings of COPD noted.  Started on heparin infusion and cardiology consulted.   Assessment and Plan:  Acute on chronic respiratory failure  Acute COPD exacerbation  -- severe exacerbation on admission, slowly improved with aggressive inpatient treatment -- initially treated with high dose IV solumedrol and antibiotics, reducing IV solumedrol today  -- added brovana, yupelri, pulmicort nebs scheduled doses -- PRN duonebs ordered for breakthru symptoms  -- added mucinex, flutter valve -- changed antibiotic to doxycycline 100 mg BID  -- pulmonary toilet   NSTEMI  -- troponin peaked at >1250 -- limited TTE with wall motion abnormalities  -- continue IV heparin infusion  -- cardiology team planning for cath and requested transfer to [facility] -- transfer orders placed tor [facility]    Newly diagnosed cardiomyopathy -- TTE with findings of moderately depressed LVEF  -- pt started on entresto   GERD -- pantoprazole for GI protection ordered  Tobacco use -- counseled on cessation  -- pt declines nicotine patch   DVT prophylaxis: IV heparin infusion  Code Status: Full  Family Communication:  Disposition: pending transfer to [facility] for cath, remain on TRH service, I will sign over care to [facility]  Consultants:  Cardiology  Procedures:   Antimicrobials:    Subjective: Pt having severe GERD symptoms, breathing is much improved today; no chest pain symptoms  Objective: Vitals:   09/18/23 0823 09/18/23 0825 09/18/23 0827 09/18/23 0830  BP:    (!) 164/91  Pulse:    (!) 103  Resp:      Temp:    98 F (36.7 C)  TempSrc:    Oral  SpO2: 95% 98% 100% 98%  Weight:      Height:        Intake/Output Summary (Last 24 hours) at 09/18/2023 0858 Last data filed at 09/17/2023 1918 Gross per 24 hour  Intake 250 ml  Output --  Net 250 ml   Filed Weights   09/16/23 0318  Weight: 83 kg   Examination:  General exam: Appears calm and comfortable  Respiratory system: diffuse inspiratory/expiratory wheezing, coughing, rales heard.  Cardiovascular system: normal S1 & S2 heard. No JVD, murmurs, rubs, gallops or clicks. No pedal edema. Gastrointestinal system: Abdomen is nondistended, soft and nontender. No organomegaly or masses felt. Normal bowel sounds heard.  Central nervous system: Alert and oriented. No focal neurological deficits. Extremities: Symmetric 5 x 5 power. Skin: No rashes, lesions or ulcers. Psychiatry: Judgement and insight appear normal. Mood & affect appropriate.   Data Reviewed: I have personally reviewed following labs and imaging studies  CBC: Recent Labs  Lab 09/16/23 0304 09/17/23 0407 09/18/23 0419  WBC 13.4* 11.8* 16.7*  NEUTROABS 8.5*  --   --   HGB 13.8 13.0 14.2  HCT 44.1 41.9 45.0  MCV 90.6 90.9 90.5  PLT 283 270 290    Basic  Metabolic Panel: Recent Labs  Lab 09/16/23 0304 09/17/23 0407 09/18/23 0419  NA 140 137 136  K 3.9 4.4 4.3  CL 103 101 98  CO2 26 28 32  GLUCOSE 110* 152* 204*  BUN 18 17 21   CREATININE 0.79 0.67 0.67  CALCIUM 8.6* 8.7* 8.5*  MG  --  2.8*  --     CBG: No results for input(s): "GLUCAP" in the last 168 hours.  Recent Results (from the past 240 hours)  Resp panel by RT-PCR (RSV, Flu A&B, Covid) Anterior Nasal Swab     Status: None   Collection Time: 09/16/23  3:27 AM   Specimen: Anterior Nasal Swab  Result Value Ref Range Status   SARS Coronavirus 2 by RT PCR NEGATIVE NEGATIVE Final    Comment: (NOTE) SARS-CoV-2 target nucleic acids are NOT DETECTED.  The SARS-CoV-2 RNA is generally detectable in upper respiratory specimens during the acute phase of infection. The lowest concentration of SARS-CoV-2 viral copies this assay can detect is 138 copies/mL. A negative result does not preclude SARS-Cov-2 infection and should not be used as the sole basis for treatment or other patient management decisions. A negative result may occur with  improper specimen collection/handling, submission of specimen other than nasopharyngeal swab, presence of viral mutation(s) within the areas targeted by this assay, and inadequate number of viral copies(<138 copies/mL). A negative result must be combined with clinical observations, patient history, and epidemiological information. The expected result is Negative.  Fact Sheet for Patients:  BloggerCourse.com  Fact Sheet for Healthcare Providers:  SeriousBroker.it  This test is no t yet approved or cleared by the [place] FDA and  has been authorized for detection and/or diagnosis of SARS-CoV-2 by FDA under an Emergency Use Authorization (EUA). This EUA will remain  in effect (meaning this test can be used) for the duration of the COVID-19 declaration under Section 564(b)(1) of the Act,  21 U.S.C.section 360bbb-3(b)(1), unless the authorization is terminated  or revoked sooner.       Influenza A by PCR NEGATIVE NEGATIVE Final   Influenza B by PCR NEGATIVE NEGATIVE Final    Comment: (NOTE) The Xpert Xpress SARS-CoV-2/FLU/RSV plus assay is intended as an aid in the diagnosis of influenza from Nasopharyngeal swab specimens and should not be used as a sole basis for treatment. Nasal washings and aspirates are unacceptable for Xpert Xpress SARS-CoV-2/FLU/RSV testing.  Fact Sheet for Patients: BloggerCourse.com  Fact Sheet for Healthcare Providers: SeriousBroker.it  This test is not yet approved or cleared by the [place] FDA and has been authorized for detection and/or diagnosis of SARS-CoV-2 by FDA under an Emergency Use Authorization (EUA). This EUA will remain in effect (meaning this test can be used) for the duration of the COVID-19 declaration under Section 564(b)(1) of the Act, 21 U.S.C. section 360bbb-3(b)(1), unless the authorization is terminated or revoked.     Resp Syncytial Virus by PCR NEGATIVE NEGATIVE Final  Comment: (NOTE) Fact Sheet for Patients: BloggerCourse.com  Fact Sheet for Healthcare Providers: SeriousBroker.it  This test is not yet approved or cleared by the [place] FDA and has been authorized for detection and/or diagnosis of SARS-CoV-2 by FDA under an Emergency Use Authorization (EUA). This EUA will remain in effect (meaning this test can be used) for the duration of the COVID-19 declaration under Section 564(b)(1) of the Act, 21 U.S.C. section 360bbb-3(b)(1), unless the authorization is terminated or revoked.  Performed at [facility], [address] [place], [place] [address]      Radiology Studies: ECHOCARDIOGRAM COMPLETE Result Date: 09/16/2023    ECHOCARDIOGRAM REPORT   Patient Name:   Erin Hobbs Date of  Exam: 09/16/2023 Medical Rec #:  [id number]       Height:       64.0 in Accession #:    [id number]      Weight:       183.0 lb Date of Birth:  21-Nov-1960       BSA:          1.884 m Patient Age:    62 years        BP:           141/94 mmHg Patient Gender: F               HR:           93 bpm. [facility]am [facility] Procedure: 2D Echo, Cardiac Doppler, Color Doppler and Intracardiac            Opacification Agent (Both Spectral and Color Flow Doppler were            utilized during procedure). Indications:    Elevated troponin  History:        Patient has no prior history of Echocardiogram examinations.                 COPD.  Sonographer:    Vern Claude Referring Phys: [id number] Basilio Cairo  Sonographer Comments: Image acquisition challenging due to patient body habitus and Image acquisition challenging due to respiratory motion. IMPRESSIONS  1. Poor acoustic windows limit study, even with Definity. LVEF is moderately depressed with hypokinesis of the distal lateral, mid/distal septal, distal anterior and apical walls.  2. Right ventricular systolic function is normal. The right ventricular size is normal.  3. The mitral valve is grossly normal. No evidence of mitral valve regurgitation.  4. The aortic valve was not well visualized. Aortic valve regurgitation is not visualized. FINDINGS  Left Ventricle: Poor acoustic windows limit study, even with Definity. LVEF is moderately depressed with hypokinesis of the distal lateral, mid/distal septal, distal anterior and apical walls. The left ventricular internal cavity size was normal in size. There is no left ventricular hypertrophy. Right Ventricle: The right ventricular size is normal. Right vetricular wall thickness was not assessed. Right ventricular systolic function is normal. Left Atrium: Left atrial size was not well visualized. Right Atrium: Right atrial size was normal in size. Pericardium: There is no evidence of pericardial effusion. Mitral Valve: The  mitral valve is grossly normal. No evidence of mitral valve regurgitation. MV peak gradient, 4.1 mmHg. The mean mitral valve gradient is 2.0 mmHg. Tricuspid Valve: The tricuspid valve is normal in structure. Tricuspid valve regurgitation is not demonstrated. Aortic Valve: The aortic valve was not well visualized. Aortic valve regurgitation is not visualized. Aortic valve mean gradient measures 3.0 mmHg. Aortic valve peak gradient measures 5.2 mmHg.  Aortic valve area, by VTI measures 2.15 cm. Pulmonic Valve: The pulmonic valve was not well visualized. Aorta: The aortic root was not well visualized.  LEFT VENTRICLE PLAX 2D LVIDd:         4.10 cm      Diastology LVIDs:         2.80 cm      LV e' medial:    9.68 cm/s LV PW:         0.90 cm      LV E/e' medial:  7.4 LV IVS:        0.70 cm      LV e' lateral:   11.20 cm/s LVOT diam:     2.00 cm      LV E/e' lateral: 6.4 LV SV:         39 LV SV Index:   21 LVOT Area:     3.14 cm  LV Volumes (MOD) LV vol d, MOD A4C: 149.0 ml LV vol s, MOD A4C: 66.0 ml LV SV MOD A4C:     149.0 ml RIGHT VENTRICLE             IVC RV Basal diam:  2.90 cm     IVC diam: 1.70 cm RV Mid diam:    2.20 cm RV S prime:     13.60 cm/s TAPSE (M-mode): 1.8 cm LEFT ATRIUM             Index        RIGHT ATRIUM          Index LA diam:        3.30 cm 1.75 cm/m   RA Area:     5.16 cm LA Vol (A2C):   19.8 ml 10.51 ml/m  RA Volume:   6.65 ml  3.53 ml/m LA Vol (A4C):   14.7 ml 7.80 ml/m LA Biplane Vol: 19.0 ml 10.09 ml/m  AORTIC VALVE                    PULMONIC VALVE AV Area (Vmax):    2.36 cm     PV Vmax:       0.99 m/s AV Area (Vmean):   2.09 cm     PV Peak grad:  3.9 mmHg AV Area (VTI):     2.15 cm AV Vmax:           114.00 cm/s AV Vmean:          76.700 cm/s AV VTI:            0.181 m AV Peak Grad:      5.2 mmHg AV Mean Grad:      3.0 mmHg LVOT Vmax:         85.50 cm/s LVOT Vmean:        51.000 cm/s LVOT VTI:          0.124 m LVOT/AV VTI ratio: 0.69  AORTA Ao Root diam: 3.20 cm Ao Asc diam:  2.60  cm MITRAL VALVE MV Area (PHT): 5.75 cm    SHUNTS MV Area VTI:   1.94 cm    Systemic VTI:  0.12 m MV Peak grad:  4.1 mmHg    Systemic Diam: 2.00 cm MV Mean grad:  2.0 mmHg MV Vmax:       1.01 m/s MV Vmean:      71.9 cm/s MV Decel Time: 132 msec MV E velocity: 71.70 cm/s MV A velocity: 74.40 cm/s MV E/A ratio:  0.96  Dietrich Pates MD Electronically signed by Dietrich Pates MD Signature Date/Time: 09/16/2023/5:31:58 PM    Final     Scheduled Meds:  arformoterol  15 mcg Nebulization BID   aspirin EC  81 mg Oral Daily   budesonide (PULMICORT) nebulizer solution  0.25 mg Nebulization BID   doxycycline  100 mg Oral Q12H   guaiFENesin  1,200 mg Oral BID   methylPREDNISolone (SOLU-MEDROL) injection  40 mg Intravenous Q12H   nicotine  21 mg Transdermal Daily   pantoprazole  40 mg Oral BID   revefenacin  175 mcg Nebulization Daily   rosuvastatin  20 mg Oral Daily   sacubitril-valsartan  1 tablet Oral BID   Continuous Infusions:  heparin 950 Units/hr (09/18/23 0828)     LOS: 1 day   Time spent: 55 mins  Yared Susan Laural Benes, MD How to contact the [facility] Attending or Consulting provider 7A - 7P or covering provider during after hours 7P -7A, for this patient?  Check the care team in [facility] and look for a) attending/consulting TRH provider listed and b) the [facility] team listed Log into [email or web address] to find provider on call.  Locate the [facility] provider you are looking for under [facility] and page to a number that you can be directly reached. If you still have difficulty reaching the provider, please page the [facility] (Director on Call) for the Hospitalists listed on amion for assistance.  09/18/2023, 8:58 AM

## 2023-09-18 NOTE — Progress Notes (Signed)
 PHARMACY - ANTICOAGULATION CONSULT NOTE  Pharmacy Consult for IV heparin Indication: chest pain/ACS  Allergies  Allergen Reactions   Aspirin Other (See Comments)    Cannot take due to stomach ulcer   Mushroom Extract Complex (Obsolete) Other (See Comments)    Dizziness     Patient Measurements: Height: 5\' 4"  (162.6 cm) Weight: 83 kg (183 lb) IBW/kg (Calculated) : 54.7 HEPARIN DW (KG): 72.8  Vital Signs: Temp: 97.8 F (36.6 C) (04/10 0325) Temp Source: Oral (04/10 0325) BP: 157/99 (04/10 0325) Pulse Rate: 94 (04/10 0325)  Labs: Recent Labs    09/16/23 0304 09/16/23 0510 09/16/23 0939 09/16/23 1217 09/16/23 1811 09/16/23 2316 09/17/23 0407 09/18/23 0419  HGB 13.8  --   --   --   --   --  13.0 14.2  HCT 44.1  --   --   --   --   --  41.9 45.0  PLT 283  --   --   --   --   --  270 290  HEPARINUNFRC  --   --   --  0.26*   < > 0.51 0.68 >1.10*  CREATININE 0.79  --   --   --   --   --  0.67 0.67  TROPONINIHS 400*   < > 1,333* 1,250*  --   --  679*  --    < > = values in this interval not displayed.    Estimated Creatinine Clearance: 76 mL/min (by C-G formula based on SCr of 0.67 mg/dL).   Medical History: Past Medical History:  Diagnosis Date   Arthritis    COPD (chronic obstructive pulmonary disease) (HCC)    Ulcer     Assessment: Erin Hobbs is a 63 y.o. year old female admitted on 09/16/2023 with concern for ACS. No anticoagulation prior to admission on dispense history. Pharmacy consulted to dose heparin.  Heparin level supra-therapeutic at >1.1, drawn appropriately  CBC WNL  Goal of Therapy:  Heparin level 0.3-0.7 units/ml Monitor platelets by anticoagulation protocol: Yes   Plan:  Hold heparin for 1h  Start heparin infusion at 950 units/hr at 0730 6h heparin level Continue to monitor H&H and platelets Possible cath once respiratory status improves  Thank you for allowing pharmacy to participate in this patient's care.  Arabella Merles,  PharmD. Clinical Pharmacist 09/18/2023 6:24 AM

## 2023-09-18 NOTE — Progress Notes (Signed)
 PHARMACY - ANTICOAGULATION CONSULT NOTE  Pharmacy Consult for IV heparin Indication: chest pain/ACS  Allergies  Allergen Reactions   Aspirin Other (See Comments)    Cannot take due to stomach ulcer   Mushroom Extract Complex (Obsolete) Other (See Comments)    Dizziness     Patient Measurements: Height: 5\' 4"  (162.6 cm) Weight: 83 kg (183 lb) IBW/kg (Calculated) : 54.7 HEPARIN DW (KG): 72.8  Vital Signs: Temp: 98 F (36.7 C) (04/10 1421) Temp Source: Oral (04/10 1421) BP: 152/94 (04/10 1421) Pulse Rate: 91 (04/10 1421)  Labs: Recent Labs    09/16/23 0304 09/16/23 0510 09/16/23 0939 09/16/23 1217 09/16/23 1811 09/17/23 0407 09/18/23 0419 09/18/23 1348  HGB 13.8  --   --   --   --  13.0 14.2  --   HCT 44.1  --   --   --   --  41.9 45.0  --   PLT 283  --   --   --   --  270 290  --   HEPARINUNFRC  --   --   --  0.26*   < > 0.68 >1.10* 0.55  CREATININE 0.79  --   --   --   --  0.67 0.67  --   TROPONINIHS 400*   < > 1,333* 1,250*  --  679*  --   --    < > = values in this interval not displayed.    Estimated Creatinine Clearance: 76 mL/min (by C-G formula based on SCr of 0.67 mg/dL).   Medical History: Past Medical History:  Diagnosis Date   Arthritis    COPD (chronic obstructive pulmonary disease) (HCC)    Ulcer     Assessment: Erin Hobbs is a 63 y.o. year old female admitted on 09/16/2023 with NSTEMI. No anticoagulation prior to admission on dispense history. Pharmacy consulted to dose heparin.  Heparin level therapeutic at 0.55 CBC WNL  Goal of Therapy:  Heparin level 0.3-0.7 units/ml Monitor platelets by anticoagulation protocol: Yes   Plan:  Continue heparin infusion at 950 units/hr. 6h heparin level Continue to monitor H&H and platelets cath once respiratory status improves  Thank you for allowing pharmacy to participate in this patient's care.  Judeth Cornfield, PharmD Clinical Pharmacist 09/18/2023 2:54 PM

## 2023-09-18 NOTE — Progress Notes (Addendum)
 Rounding Note    Patient Name: Erin Hobbs Date of Encounter: 09/18/2023  [facility] Cardiologist: Dietrich Pates, MD   Subjective   This am, actively receiving breathing treatment. Reported significant improvement  in breathing since yesterday. Still reports intermittent CP tightness at rest, lower sternum, lasting seconds 4/10, however suspect related to ulcer. Currently no chest pain.  Inpatient Medications    Scheduled Meds:  arformoterol  15 mcg Nebulization BID   aspirin EC  81 mg Oral Daily   budesonide (PULMICORT) nebulizer solution  0.25 mg Nebulization BID   doxycycline  100 mg Oral Q12H   guaiFENesin  1,200 mg Oral BID   methylPREDNISolone (SOLU-MEDROL) injection  80 mg Intravenous Q12H   nicotine  21 mg Transdermal Daily   pantoprazole (PROTONIX) IV  40 mg Intravenous Q24H   revefenacin  175 mcg Nebulization Daily   rosuvastatin  20 mg Oral Daily   sacubitril-valsartan  1 tablet Oral BID   Continuous Infusions:  heparin     PRN Meds: acetaminophen **OR** acetaminophen, ALPRAZolam, benzonatate, HYDROcodone-acetaminophen, ipratropium-albuterol, nitroGLYCERIN, ondansetron **OR** ondansetron (ZOFRAN) IV, mouth rinse   Vital Signs    Vitals:   09/17/23 1934 09/17/23 2014 09/18/23 0259 09/18/23 0325  BP: (!) 152/83   (!) 157/99  Pulse: 85   94  Resp: 18   18  Temp: 97.9 F (36.6 C)   97.8 F (36.6 C)  TempSrc: Oral   Oral  SpO2: 99% 90% 99% 100%  Weight:      Height:        Intake/Output Summary (Last 24 hours) at 09/18/2023 0725 Last data filed at 09/17/2023 1918 Gross per 24 hour  Intake 250 ml  Output --  Net 250 ml      09/16/2023    3:18 AM 08/16/2023    9:20 AM 09/04/2022    3:02 PM  Last 3 Weights  Weight (lbs) 183 lb 182 lb 175 lb 14.8 oz  Weight (kg) 83.008 kg 82.555 kg 79.8 kg      Telemetry    NSR, HR 70-80's with baseline artifact  - Personally Reviewed  Physical Exam   GEN: Laying in bed actively receiving breathing  treatment, No acute distress. Appears to have improved Neck: No JVD Cardiac: RRR, no murmurs, rubs, or gallops.  Respiratory: mildly wheezing throughout, no rale or crackles GI: Soft, nontender, non-distended  MS: No edema; No deformity. Neuro:  Nonfocal  Psych: Normal affect   Labs    High Sensitivity Troponin:   Recent Labs  Lab 09/16/23 0510 09/16/23 0712 09/16/23 0939 09/16/23 1217 09/17/23 0407  TROPONINIHS 1,180* 1,249* 1,333* 1,250* 679*     Chemistry Recent Labs  Lab 09/16/23 0304 09/17/23 0407 09/18/23 0419  NA 140 137 136  K 3.9 4.4 4.3  CL 103 101 98  CO2 26 28 32  GLUCOSE 110* 152* 204*  BUN 18 17 21   CREATININE 0.79 0.67 0.67  CALCIUM 8.6* 8.7* 8.5*  MG  --  2.8*  --   GFRNONAA >60 >60 >60  ANIONGAP 11 8 6     Lipids  Recent Labs  Lab 09/16/23 0939  CHOL 170  TRIG 38  HDL 66  LDLCALC 96  CHOLHDL 2.6    Hematology Recent Labs  Lab 09/16/23 0304 09/17/23 0407 09/18/23 0419  WBC 13.4* 11.8* 16.7*  RBC 4.87 4.61 4.97  HGB 13.8 13.0 14.2  HCT 44.1 41.9 45.0  MCV 90.6 90.9 90.5  MCH 28.3 28.2 28.6  MCHC  31.3 31.0 31.6  RDW 14.0 14.5 14.1  PLT 283 270 290   BNP Recent Labs  Lab 09/16/23 0939  BNP 227.0*     Cardiac Studies   ECHO IMPRESSIONS 09/16/23  1. Poor acoustic windows limit study, even with Definity. LVEF is  moderately depressed with hypokinesis of the distal lateral, mid/distal  septal, distal anterior and apical walls.   2. Right ventricular systolic function is normal. The right ventricular  size is normal.   3. The mitral valve is grossly normal. No evidence of mitral valve  regurgitation.   4. The aortic valve was not well visualized. Aortic valve regurgitation  is not visualized.   FINDINGS   Left Ventricle: Poor acoustic windows limit study, even with Definity.  LVEF is moderately depressed with hypokinesis of the distal lateral,  mid/distal septal, distal anterior and apical walls. The left ventricular   internal cavity size was normal in  size. There is no left ventricular hypertrophy.   Right Ventricle: The right ventricular size is normal. Right vetricular  wall thickness was not assessed. Right ventricular systolic function is  normal.   Left Atrium: Left atrial size was not well visualized.   Right Atrium: Right atrial size was normal in size.   Pericardium: There is no evidence of pericardial effusion.   Mitral Valve: The mitral valve is grossly normal. No evidence of mitral  valve regurgitation. MV peak gradient, 4.1 mmHg. The mean mitral valve  gradient is 2.0 mmHg.   Tricuspid Valve: The tricuspid valve is normal in structure. Tricuspid  valve regurgitation is not demonstrated.   Aortic Valve: The aortic valve was not well visualized. Aortic valve  regurgitation is not visualized. Aortic valve mean gradient measures 3.0  mmHg. Aortic valve peak gradient measures 5.2 mmHg. Aortic valve area, by  VTI measures 2.15 cm.   Pulmonic Valve: The pulmonic valve was not well visualized.   Aorta: The aortic root was not well visualized.   Patient Profile     63 y.o. female with a hx of COPD (on home O2), GERD, tobacco use who is being seen 09/16/2023 for the evaluation of elevated troponin at the request of Dr. Thomes Dinning.   Assessment & Plan    Elevated troponin/NSTEMI Reported brief, intermittent CP episodes x 1 week described as crampy and pressure, 4/10, lasting seconds, exacerbated by stress, nonexertional, not pleuritic, associated with numbness in fingers and face. - HsTn 40>1180>149>1333>1250 >679.  EKG with no ischemic changes.  LDL 96.  A1c 5.9.  - echo 09/16/2023: Limited study with poor acoustic windows.  LVEF moderately depressed with hypokinesis (distal lateral, mid/distal septal, distal anterior and apical walls), normal RV function and size - this am, continues to reports intermittent CP. Currently chest pain free. Suspect CP is related to ulcer, however has been  receiving PPI without any changes.  - Currently on IV heparin, Refused ASA 81 mg due to ulcer  - With ASCVD 8.1% and LDL was 96, continue Crestor 20 mg. - Once RR status improve, would consider bisoprolol given PVC and cardiomyopathy. - Concerned for cardiac cause with elevated Tn > 1000, + WMA above, Tobacco use and FHx of CAD. Once her pulmonary status improves, planning for cath. Hospitalist already notified to set bed transfer to [facility]. Review with MD to add to cath board tomorrow.    COPD exacerbation History of COPD x 4 to 5 years that is currently treated with Symbicort, Combivent and Ventolin.  Reported shortness of breath worsened x 2  days along with orthopnea and edema.  Home O2 usually 2L [place] but increased to 4L currently.  CBC with elevated WBC 13.4, neutrophils 8.5, monocytes 1.7.  Negative respiratory panel.  Procalcitonin WNL - Actively receiving treatment this am. Reports significant improvement in SOB. Appears significantly better with RR 18 -20, however, mild wheezing still present. - Yesterday d/c Duoneb, Levalbuterol and started Brovana, Doxycycline, Mucinex, Yuperli. Continue new meds in addition to Pulmicort, steroids. Managed per admitting team.    New Cardiomyopathy  Edema Reported edema x 1 week in extremities not relieved with elevation - BNP 227 - Euvolemic on exam - yesterday started Entresto 24-26 mg BID - ECHO as noted above. Add GDMT after cath. Consider SGLTi2 post cath.    PVC - Telemetry: NSR, HR 70-80's - Electrolytes WNL. K 4.3, 4/9 Mg 2.8  - Once RR status improve, would consider bisoprolol given PVC and cardiomyopathy.     For questions or updates, please contact [facility] Please consult [email or web address] for contact info under        Signed, Basilio Cairo, PA-C  09/18/2023, 7:25 AM      Attending note:  Patient seen and examined.  Chart reviewed, agree with above assessment by Ms. Dunlap PA-C.  Ms. Brosious states that her breathing  continues to improve, intermittent cough and mild wheeze noted.  Reports occasional, more atypical sounding chest discomfort.  She is afebrile, heart rate 90s to low 100s in sinus rhythm by telemetry.  Systolic running 161W to 160s.  Lungs exhibit prolonged expiratory phase with mild expiratory wheeze.  Cardiac exam with RRR and no gallop.  Pertinent lab work includes potassium 4.3, creatinine 0.67 with GFR greater than 60, LDL 96, WBC 16.7 (steroids), hemoglobin 14.2, platelets 290.  NSTEMI, peak high-sensitivity troponin I 1333.  Newly documented cardiomyopathy with moderate LV dysfunction and wall motion abnormalities suggesting potential ischemic cardiomyopathy.  She is being treated for COPD exacerbation with improving respiratory status.  Awaits bed at [facility] for transfer on the hospitalist team, and ultimately diagnostic cardiac catheterization.  Continue aspirin 81 mg daily, Crestor 20 mg daily, started on Entresto 24/26 mg twice daily.  Hold off on bisoprolol for now.  Add Aldactone 12.5 mg daily.  Jonelle Sidle, M.D., F.A.C.C.

## 2023-09-19 ENCOUNTER — Encounter (HOSPITAL_COMMUNITY)
Admission: EM | Disposition: A | Discharge status: Home / Self Care | Payer: Self-pay | Source: Home / Self Care | Attending: Internal Medicine

## 2023-09-19 ENCOUNTER — Telehealth (HOSPITAL_COMMUNITY): Payer: Self-pay | Admitting: Pharmacy Technician

## 2023-09-19 ENCOUNTER — Other Ambulatory Visit (HOSPITAL_COMMUNITY): Payer: Self-pay

## 2023-09-19 ENCOUNTER — Ambulatory Visit (HOSPITAL_COMMUNITY): Admission: RE | Admit: 2023-09-19 | Source: Home / Self Care | Admitting: Cardiology

## 2023-09-19 DIAGNOSIS — J9621 Acute and chronic respiratory failure with hypoxia: Secondary | ICD-10-CM | POA: Diagnosis not present

## 2023-09-19 DIAGNOSIS — J441 Chronic obstructive pulmonary disease with (acute) exacerbation: Secondary | ICD-10-CM | POA: Diagnosis not present

## 2023-09-19 DIAGNOSIS — I2510 Atherosclerotic heart disease of native coronary artery without angina pectoris: Secondary | ICD-10-CM

## 2023-09-19 DIAGNOSIS — I214 Non-ST elevation (NSTEMI) myocardial infarction: Secondary | ICD-10-CM | POA: Diagnosis not present

## 2023-09-19 DIAGNOSIS — R7989 Other specified abnormal findings of blood chemistry: Secondary | ICD-10-CM

## 2023-09-19 HISTORY — PX: RIGHT/LEFT HEART CATH AND CORONARY ANGIOGRAPHY: CATH118266

## 2023-09-19 LAB — POCT I-STAT 7, (LYTES, BLD GAS, ICA,H+H)
Acid-Base Excess: 8 mmol/L — ABNORMAL HIGH (ref 0.0–2.0)
Bicarbonate: 35 mmol/L — ABNORMAL HIGH (ref 20.0–28.0)
Calcium, Ion: 1.14 mmol/L — ABNORMAL LOW (ref 1.15–1.40)
HCT: 45 % (ref 36.0–46.0)
Hemoglobin: 15.3 g/dL — ABNORMAL HIGH (ref 12.0–15.0)
O2 Saturation: 93 %
Potassium: 4.6 mmol/L (ref 3.5–5.1)
Sodium: 137 mmol/L (ref 135–145)
TCO2: 37 mmol/L — ABNORMAL HIGH (ref 22–32)
pCO2 arterial: 54.4 mmHg — ABNORMAL HIGH (ref 32–48)
pH, Arterial: 7.416 (ref 7.35–7.45)
pO2, Arterial: 67 mmHg — ABNORMAL LOW (ref 83–108)

## 2023-09-19 LAB — POCT I-STAT EG7
Acid-Base Excess: 8 mmol/L — ABNORMAL HIGH (ref 0.0–2.0)
Acid-Base Excess: 9 mmol/L — ABNORMAL HIGH (ref 0.0–2.0)
Bicarbonate: 35.7 mmol/L — ABNORMAL HIGH (ref 20.0–28.0)
Bicarbonate: 36.7 mmol/L — ABNORMAL HIGH (ref 20.0–28.0)
Calcium, Ion: 1.14 mmol/L — ABNORMAL LOW (ref 1.15–1.40)
Calcium, Ion: 1.15 mmol/L (ref 1.15–1.40)
HCT: 44 % (ref 36.0–46.0)
HCT: 45 % (ref 36.0–46.0)
Hemoglobin: 15 g/dL (ref 12.0–15.0)
Hemoglobin: 15.3 g/dL — ABNORMAL HIGH (ref 12.0–15.0)
O2 Saturation: 66 %
O2 Saturation: 66 %
Potassium: 4.6 mmol/L (ref 3.5–5.1)
Potassium: 4.6 mmol/L (ref 3.5–5.1)
Sodium: 138 mmol/L (ref 135–145)
Sodium: 138 mmol/L (ref 135–145)
TCO2: 37 mmol/L — ABNORMAL HIGH (ref 22–32)
TCO2: 39 mmol/L — ABNORMAL HIGH (ref 22–32)
pCO2, Ven: 58.5 mmHg (ref 44–60)
pCO2, Ven: 59.7 mmHg (ref 44–60)
pH, Ven: 7.393 (ref 7.25–7.43)
pH, Ven: 7.397 (ref 7.25–7.43)
pO2, Ven: 36 mmHg (ref 32–45)
pO2, Ven: 36 mmHg (ref 32–45)

## 2023-09-19 LAB — CBC
HCT: 47.8 % — ABNORMAL HIGH (ref 36.0–46.0)
HCT: 45.9 % (ref 36.0–46.0)
Hemoglobin: 15.1 g/dL — ABNORMAL HIGH (ref 12.0–15.0)
Hemoglobin: 14.8 g/dL (ref 12.0–15.0)
MCH: 28.2 pg (ref 26.0–34.0)
MCH: 28.6 pg (ref 26.0–34.0)
MCHC: 31.6 g/dL (ref 30.0–36.0)
MCHC: 32.2 g/dL (ref 30.0–36.0)
MCV: 89.2 fL (ref 80.0–100.0)
MCV: 88.6 fL (ref 80.0–100.0)
Platelets: 271 10*3/uL (ref 150–400)
Platelets: 259 10*3/uL (ref 150–400)
RBC: 5.36 MIL/uL — ABNORMAL HIGH (ref 3.87–5.11)
RBC: 5.18 MIL/uL — ABNORMAL HIGH (ref 3.87–5.11)
RDW: 13.9 % (ref 11.5–15.5)
RDW: 13.9 % (ref 11.5–15.5)
WBC: 16.7 10*3/uL — ABNORMAL HIGH (ref 4.0–10.5)
WBC: 16.9 10*3/uL — ABNORMAL HIGH (ref 4.0–10.5)
nRBC: 0 % (ref 0.0–0.2)
nRBC: 0 % (ref 0.0–0.2)

## 2023-09-19 LAB — CREATININE, SERUM
Creatinine, Ser: 0.79 mg/dL (ref 0.44–1.00)
GFR, Estimated: >60 mL/min (ref >60)

## 2023-09-19 LAB — BASIC METABOLIC PANEL WITH GFR
Anion gap: 9 (ref 5–15)
BUN: 17 mg/dL (ref 8–23)
CO2: 33 mmol/L — ABNORMAL HIGH (ref 22–32)
Calcium: 8.7 mg/dL — ABNORMAL LOW (ref 8.9–10.3)
Chloride: 97 mmol/L — ABNORMAL LOW (ref 98–111)
Creatinine, Ser: 0.77 mg/dL (ref 0.44–1.00)
GFR, Estimated: >60 mL/min (ref >60)
Glucose, Bld: 173 mg/dL — ABNORMAL HIGH (ref 70–99)
Potassium: 4.8 mmol/L (ref 3.5–5.1)
Sodium: 139 mmol/L (ref 135–145)

## 2023-09-19 LAB — HEPARIN LEVEL (UNFRACTIONATED): Heparin Unfractionated: 0.59 [IU]/mL (ref 0.30–0.70)

## 2023-09-19 SURGERY — RIGHT/LEFT HEART CATH AND CORONARY ANGIOGRAPHY
Anesthesia: LOCAL

## 2023-09-19 MED ORDER — LIDOCAINE HCL (PF) 1 % IJ SOLN
INTRAMUSCULAR | Status: DC | PRN
Start: 2023-09-19 — End: 2023-09-19
  Administered 2023-09-19 (×2): 2 mL

## 2023-09-19 MED ORDER — HYDRALAZINE HCL 20 MG/ML IJ SOLN: 10.0000 mg | INTRAMUSCULAR | Status: AC | PRN

## 2023-09-19 MED ORDER — FENTANYL CITRATE (PF) 100 MCG/2ML IJ SOLN
INTRAMUSCULAR | Status: DC | PRN
  Administered 2023-09-19: 25 ug via INTRAVENOUS

## 2023-09-19 MED ORDER — SODIUM CHLORIDE 0.9% FLUSH
3.0000 mL | INTRAVENOUS | Status: DC | PRN
  Administered 2023-09-19: 3 mL via INTRAVENOUS

## 2023-09-19 MED ORDER — NITROGLYCERIN 1 MG/10 ML FOR IR/CATH LAB
INTRA_ARTERIAL | Status: DC | PRN
  Administered 2023-09-19: 200 ug via INTRACORONARY

## 2023-09-19 MED ORDER — IOHEXOL 350 MG/ML SOLN
INTRAVENOUS | Status: DC | PRN
  Administered 2023-09-19: 50 mL

## 2023-09-19 MED ORDER — VERAPAMIL HCL 2.5 MG/ML IV SOLN
INTRAVENOUS | Status: DC | PRN
Start: 2023-09-19 — End: 2023-09-19
  Administered 2023-09-19: 3 mg via INTRA_ARTERIAL

## 2023-09-19 MED ORDER — MIDAZOLAM HCL 2 MG/2ML IJ SOLN
INTRAMUSCULAR | Status: AC
Start: 2023-09-19 — End: ?
  Filled 2023-09-19: qty 2

## 2023-09-19 MED ORDER — NITROGLYCERIN 1 MG/10 ML FOR IR/CATH LAB
INTRA_ARTERIAL | Status: AC
  Filled 2023-09-19: qty 10

## 2023-09-19 MED ORDER — SODIUM CHLORIDE 0.9% FLUSH
3.0000 mL | Freq: Two times a day (BID) | INTRAVENOUS | Status: DC
  Administered 2023-09-19 – 2023-09-21 (×4): 3 mL via INTRAVENOUS

## 2023-09-19 MED ORDER — HEPARIN SODIUM (PORCINE) 5000 UNIT/ML IJ SOLN
5000.0000 [IU] | Freq: Three times a day (TID) | INTRAMUSCULAR | Status: DC
  Administered 2023-09-19 – 2023-09-20 (×4): 5000 [IU] via SUBCUTANEOUS
  Filled 2023-09-19 (×4): qty 1

## 2023-09-19 MED ORDER — VERAPAMIL HCL 2.5 MG/ML IV SOLN
INTRAVENOUS | Status: AC
  Filled 2023-09-19: qty 2

## 2023-09-19 MED ORDER — SODIUM CHLORIDE 0.9 % IV SOLN: INTRAVENOUS | Status: AC

## 2023-09-19 MED ORDER — HEPARIN (PORCINE) IN NACL 1000-0.9 UT/500ML-% IV SOLN
INTRAVENOUS | Status: DC | PRN
  Administered 2023-09-19 (×2): 500 mL

## 2023-09-19 MED ORDER — HEPARIN SODIUM (PORCINE) 1000 UNIT/ML IJ SOLN
INTRAMUSCULAR | Status: DC | PRN
  Administered 2023-09-19: 4000 [IU] via INTRAVENOUS

## 2023-09-19 MED ORDER — MIDAZOLAM HCL 2 MG/2ML IJ SOLN
INTRAMUSCULAR | Status: DC | PRN
  Administered 2023-09-19: 1 mg via INTRAVENOUS

## 2023-09-19 MED ORDER — FENTANYL CITRATE (PF) 100 MCG/2ML IJ SOLN
INTRAMUSCULAR | Status: AC
  Filled 2023-09-19: qty 2

## 2023-09-19 MED ORDER — SODIUM CHLORIDE 0.9 % IV SOLN: 250.0000 mL | INTRAVENOUS | Status: DC | PRN

## 2023-09-19 MED ORDER — LIDOCAINE HCL (PF) 1 % IJ SOLN
INTRAMUSCULAR | Status: AC
  Filled 2023-09-19: qty 30

## 2023-09-19 MED ORDER — HEPARIN SODIUM (PORCINE) 1000 UNIT/ML IJ SOLN
INTRAMUSCULAR | Status: AC
  Filled 2023-09-19: qty 10

## 2023-09-19 MED ORDER — LABETALOL HCL 5 MG/ML IV SOLN: 10.0000 mg | INTRAVENOUS | Status: AC | PRN

## 2023-09-19 SURGICAL SUPPLY — 10 items
CATH BALLN WEDGE 5F 110CM (CATHETERS) IMPLANT
CATH INFINITI AMBI 5FR TG (CATHETERS) IMPLANT
DEVICE RAD COMP TR BAND LRG (VASCULAR PRODUCTS) IMPLANT
GLIDESHEATH SLEND SS 6F .021 (SHEATH) IMPLANT
GUIDEWIRE .025 260CM (WIRE) IMPLANT
GUIDEWIRE INQWIRE 1.5J.035X260 (WIRE) IMPLANT
INQWIRE 1.5J .035X260CM (WIRE) ×1 IMPLANT
PACK CARDIAC CATHETERIZATION (CUSTOM PROCEDURE TRAY) ×2 IMPLANT
SHEATH GLIDE SLENDER 4/5FR (SHEATH) IMPLANT
SHEATH PROBE COVER 6X72 (BAG) IMPLANT

## 2023-09-19 NOTE — Progress Notes (Addendum)
 [facility] PROGRESS NOTE   Erin Hobbs CZY:[id number] DOB: 1961-05-06 DOA: 09/16/2023  PCP: Jamey Reas, PA-C  Brief History: 63 y.o. female with medical history significant for COPD with chronic hypoxemic respiratory failure and tobacco abuse who presented to the ED for worsening shortness of breath and cough.  Patient was hospitalized for COPD exacerbation.  However she was also found to have significant troponin elevation.    Consultants: Cardiology  Procedures: Echocardiogram.  Cardiac catheterization is pending    Subjective/Interval History: Patient complains of shortness of breath this morning.  No nausea or vomiting.  No chest pain per se.    Assessment/Plan:  Acute on chronic respiratory failure  Acute COPD exacerbation  Patient was started on nebulizer treatments and steroids. Slow to improve.  Wheezing this morning. Continue Solu-Medrol for now. Continue doxycycline. Uses 2 L of oxygen by nasal cannula at home.  Currently on 4 to 5 L.  Try to wean down to maintain sats greater than 88%. Outpatient referral to pulmonology.  NSTEMI Troponin peaked at 1250.  Echocardiogram showed low EF with wall motion abnormalities. Seen by cardiology.  Plan is for cardiac catheterization. Noted to be on IV heparin. Noted to be on aspirin, statin.  Newly diagnosed cardiomyopathy/chronic acute CHF Depressed EF noted on echocardiogram. Patient has been started on Entresto and Aldactone.  It appears that plan is for a right heart catheterization as well. Not noted to be on furosemide. Management per cardiology.  GERD Protonix.  Leukocytosis Secondary to steroids.  Tobacco abuse Counseled.  Obesity Estimated body mass index is 31.93 kg/m as calculated from the following:   Height as of this encounter: 5\' 4"  (1.626 m).   Weight as of this encounter: 84.4 kg.   DVT Prophylaxis: On IV heparin Code Status: Full code Family Communication: Discussed  with patient Disposition Plan: Hopefully return home when improved  Status is: Inpatient Remains inpatient appropriate because: COPD exacerbation, NSTEMI      Medications: Scheduled:  arformoterol  15 mcg Nebulization BID   aspirin  81 mg Oral Pre-Cath   aspirin EC  81 mg Oral Daily   budesonide (PULMICORT) nebulizer solution  0.25 mg Nebulization BID   doxycycline  100 mg Oral Q12H   guaiFENesin  1,200 mg Oral BID   methylPREDNISolone (SOLU-MEDROL) injection  40 mg Intravenous Q12H   nicotine  21 mg Transdermal Daily   nystatin  5 mL Mouth/Throat QID   pantoprazole  40 mg Oral BID   revefenacin  175 mcg Nebulization Daily   rosuvastatin  20 mg Oral Daily   sacubitril-valsartan  1 tablet Oral BID   spironolactone  12.5 mg Oral Daily   Continuous:  sodium chloride 10 mL/hr at 09/19/23 0900   heparin 950 Units/hr (09/19/23 0000)   UXN:ATFTDDUKGURKY **OR** acetaminophen, ALPRAZolam, benzonatate, HYDROcodone-acetaminophen, ipratropium-albuterol, nitroGLYCERIN, ondansetron **OR** ondansetron (ZOFRAN) IV, mouth rinse  Antibiotics: Anti-infectives (From admission, onward)    Start     Dose/Rate Route Frequency Ordered Stop   09/18/23 1715  fluconazole (DIFLUCAN) tablet 150 mg        150 mg Oral  Once 09/18/23 1625 09/18/23 1658   09/17/23 2200  doxycycline (VIBRA-TABS) tablet 100 mg        100 mg Oral Every 12 hours 09/17/23 1524     09/16/23 1030  azithromycin (ZITHROMAX) 500 mg in sodium chloride 0.9 % 250 mL IVPB  Status:  Discontinued        500 mg 250 mL/hr over 60 Minutes  Intravenous Every 24 hours 09/16/23 0930 09/17/23 1524       Objective:  Vital Signs  Vitals:   09/18/23 2346 09/19/23 0518 09/19/23 0754 09/19/23 0816  BP: (!) 139/99 (!) 142/86  (!) 147/90  Pulse: 91 91  89  Resp: 18 16 18 18   Temp: 97.9 F (36.6 C) 98 F (36.7 C)  97.8 F (36.6 C)  TempSrc: Oral Oral  Axillary  SpO2: 100% 98%    Weight:      Height:        Intake/Output Summary  (Last 24 hours) at 09/19/2023 1020 Last data filed at 09/19/2023 1018 Gross per 24 hour  Intake 386.84 ml  Output 750 ml  Net -363.16 ml   Filed Weights   09/16/23 0318 09/18/23 1815  Weight: 83 kg 84.4 kg    General appearance: Awake alert.  In no distress Resp: Tachypneic.  No use of accessory muscles.  Wheezing heard bilaterally.  Diminished air entry at the bases.  Few crackles. Cardio: S1-S2 is normal regular.  No S3-S4.  No rubs murmurs or bruit GI: Abdomen is soft.  Nontender nondistended.  Bowel sounds are present normal.  No masses organomegaly Extremities: No edema.  Full range of motion of lower extremities. Neurologic: Alert and oriented x3.  No focal neurological deficits.    Lab Results:  Data Reviewed: I have personally reviewed following labs and reports of the imaging studies  CBC: Recent Labs  Lab 09/16/23 0304 09/17/23 0407 09/18/23 0419 09/19/23 0509  WBC 13.4* 11.8* 16.7* 16.7*  NEUTROABS 8.5*  --   --   --   HGB 13.8 13.0 14.2 15.1*  HCT 44.1 41.9 45.0 47.8*  MCV 90.6 90.9 90.5 89.2  PLT 283 270 290 271    Basic Metabolic Panel: Recent Labs  Lab 09/16/23 0304 09/17/23 0407 09/18/23 0419 09/19/23 0509  NA 140 137 136 139  K 3.9 4.4 4.3 4.8  CL 103 101 98 97*  CO2 26 28 32 33*  GLUCOSE 110* 152* 204* 173*  BUN 18 17 21 17   CREATININE 0.79 0.67 0.67 0.77  CALCIUM 8.6* 8.7* 8.5* 8.7*  MG  --  2.8*  --   --     GFR: Estimated Creatinine Clearance: 76.7 mL/min (by C-G formula based on SCr of 0.77 mg/dL).    Recent Results (from the past 240 hours)  Resp panel by RT-PCR (RSV, Flu A&B, Covid) Anterior Nasal Swab     Status: None   Collection Time: 09/16/23  3:27 AM   Specimen: Anterior Nasal Swab  Result Value Ref Range Status   SARS Coronavirus 2 by RT PCR NEGATIVE NEGATIVE Final    Comment: (NOTE) SARS-CoV-2 target nucleic acids are NOT DETECTED.  The SARS-CoV-2 RNA is generally detectable in upper respiratory specimens during the  acute phase of infection. The lowest concentration of SARS-CoV-2 viral copies this assay can detect is 138 copies/mL. A negative result does not preclude SARS-Cov-2 infection and should not be used as the sole basis for treatment or other patient management decisions. A negative result may occur with  improper specimen collection/handling, submission of specimen other than nasopharyngeal swab, presence of viral mutation(s) within the areas targeted by this assay, and inadequate number of viral copies(<138 copies/mL). A negative result must be combined with clinical observations, patient history, and epidemiological information. The expected result is Negative.  Fact Sheet for Patients:  BloggerCourse.com  Fact Sheet for Healthcare Providers:  SeriousBroker.it  This test is no t yet  approved or cleared by the [place] and  has been authorized for detection and/or diagnosis of SARS-CoV-2 by FDA under an Emergency Use Authorization (EUA). This EUA will remain  in effect (meaning this test can be used) for the duration of the COVID-19 declaration under Section 564(b)(1) of the Act, 21 U.S.C.section 360bbb-3(b)(1), unless the authorization is terminated  or revoked sooner.       Influenza A by PCR NEGATIVE NEGATIVE Final   Influenza B by PCR NEGATIVE NEGATIVE Final    Comment: (NOTE) The Xpert Xpress SARS-CoV-2/FLU/RSV plus assay is intended as an aid in the diagnosis of influenza from Nasopharyngeal swab specimens and should not be used as a sole basis for treatment. Nasal washings and aspirates are unacceptable for Xpert Xpress SARS-CoV-2/FLU/RSV testing.  Fact Sheet for Patients: BloggerCourse.com  Fact Sheet for Healthcare Providers: SeriousBroker.it  This test is not yet approved or cleared by the [place] FDA and has been authorized for detection and/or  diagnosis of SARS-CoV-2 by FDA under an Emergency Use Authorization (EUA). This EUA will remain in effect (meaning this test can be used) for the duration of the COVID-19 declaration under Section 564(b)(1) of the Act, 21 U.S.C. section 360bbb-3(b)(1), unless the authorization is terminated or revoked.     Resp Syncytial Virus by PCR NEGATIVE NEGATIVE Final    Comment: (NOTE) Fact Sheet for Patients: BloggerCourse.com  Fact Sheet for Healthcare Providers: SeriousBroker.it  This test is not yet approved or cleared by the [place] FDA and has been authorized for detection and/or diagnosis of SARS-CoV-2 by FDA under an Emergency Use Authorization (EUA). This EUA will remain in effect (meaning this test can be used) for the duration of the COVID-19 declaration under Section 564(b)(1) of the Act, 21 U.S.C. section 360bbb-3(b)(1), unless the authorization is terminated or revoked.  Performed at [facility], [address], [place], [place] [address]       Radiology Studies: No results found.     LOS: 2 days   Erin Hobbs Erin Hobbs  [facility] Pager on [email or web address]  09/19/2023, 10:20 AM

## 2023-09-19 NOTE — Plan of Care (Signed)

## 2023-09-19 NOTE — Telephone Encounter (Signed)
 Patient Product/process development scientist completed.    The patient is insured through [employer].     Ran test claim for Entresto 24-26 mg and the current 30 day co-pay is $4.00.  Ran test claim for Brilinta 90 mg and the current 30 day co-pay is $4.00.  Ran test claim for Farxgia 10 mg and Requires Prior Authorization  Ran test claim for Jardiance 10 mg and Requires Prior Authorization  This test claim was processed through [employer]- copay amounts may vary at other pharmacies due to [employer], or as the patient moves through the different stages of their insurance plan.     Roland Earl, CPHT Pharmacy Technician III Certified Patient Advocate [facility] Pharmacy Patient Advocate Team Direct Number: [phone]  Fax: [phone]

## 2023-09-19 NOTE — Progress Notes (Signed)
 HR up as high as 160s non sustained. EKG obtained. On call cards paged. Only symptom reported by patient is a "fluttering feeling". No return call from cardiology. Previously weaned patient from [facility] to [facility]. Return O2 to 4LNC. HR stabilized. Notified [facility]. No new orders given.

## 2023-09-19 NOTE — Telephone Encounter (Signed)
 Pharmacy Patient Advocate Encounter   Received notification from Inpatient Request that prior authorization for Farxiga 10MG  tablets is required/requested.   Insurance verification completed.   The patient is insured through [facility] [place] .   Per test claim: PA required; PA submitted to above mentioned insurance via CoverMyMeds Key/confirmation #/EOC [id number]) Status is pending

## 2023-09-19 NOTE — Telephone Encounter (Signed)
 Pharmacy Patient Advocate Encounter  Received notification from [facility] Medicaid that Prior Authorization for Farxiga 10MG  tablets  has been APPROVED from 09/19/2023 to 09/18/2024. Ran test claim, Copay is $4.00. This test claim was processed through [facility]- copay amounts may vary at other pharmacies due to pharmacy/plan contracts, or as the patient moves through the different stages of their insurance plan.   PA #/Case ID/Reference #: [id number]

## 2023-09-19 NOTE — Plan of Care (Signed)
 AAOx4, calm, cooperative, R radial site intact, small bruising, no complications. Air removed, VS within expected parameters. Pt is 1xassist to bedside commode. On 4 L O2, requested prn respiratory treatment.  Call light and belongings within reach.  Problem: Education: Goal: Knowledge of General Education information will improve Description: Including pain rating scale, medication(s)/side effects and non-pharmacologic comfort measures Outcome: Progressing   Problem: Health Behavior/Discharge Planning: Goal: Ability to manage health-related needs will improve Outcome: Progressing   Problem: Clinical Measurements: Goal: Ability to maintain clinical measurements within normal limits will improve Outcome: Progressing Goal: Will remain free from infection Outcome: Progressing Goal: Diagnostic test results will improve Outcome: Progressing Goal: Respiratory complications will improve Outcome: Progressing Goal: Cardiovascular complication will be avoided Outcome: Progressing   Problem: Activity: Goal: Risk for activity intolerance will decrease Outcome: Progressing   Problem: Nutrition: Goal: Adequate nutrition will be maintained Outcome: Progressing   Problem: Coping: Goal: Level of anxiety will decrease Outcome: Progressing   Problem: Elimination: Goal: Will not experience complications related to bowel motility Outcome: Progressing Goal: Will not experience complications related to urinary retention Outcome: Progressing   Problem: Pain Managment: Goal: General experience of comfort will improve and/or be controlled Outcome: Progressing   Problem: Safety: Goal: Ability to remain free from injury will improve Outcome: Progressing   Problem: Skin Integrity: Goal: Risk for impaired skin integrity will decrease Outcome: Progressing   Problem: Education: Goal: Understanding of CV disease, CV risk reduction, and recovery process will improve Outcome: Progressing Goal:  Individualized Educational Video(s) Outcome: Progressing   Problem: Activity: Goal: Ability to return to baseline activity level will improve Outcome: Progressing   Problem: Cardiovascular: Goal: Ability to achieve and maintain adequate cardiovascular perfusion will improve Outcome: Progressing Goal: Vascular access site(s) Level 0-1 will be maintained Outcome: Progressing   Problem: Health Behavior/Discharge Planning: Goal: Ability to safely manage health-related needs after discharge will improve Outcome: Progressing

## 2023-09-19 NOTE — Progress Notes (Signed)
 PHARMACY - ANTICOAGULATION CONSULT NOTE  Pharmacy Consult for IV heparin Indication: chest pain/ACS  Allergies  Allergen Reactions   Aspirin Other (See Comments)    Cannot take due to stomach ulcer   Mushroom Extract Complex (Obsolete) Other (See Comments)    Dizziness     Patient Measurements: Height: 5\' 4"  (162.6 cm) Weight: 84.4 kg (186 lb) IBW/kg (Calculated) : 54.7 HEPARIN DW (KG): 73.2  Vital Signs: Temp: 98 F (36.7 C) (04/11 0518) Temp Source: Oral (04/11 0518) BP: 142/86 (04/11 0518) Pulse Rate: 91 (04/11 0518)  Labs: Recent Labs    09/16/23 0939 09/16/23 1217 09/16/23 1811 09/17/23 0407 09/18/23 0419 09/18/23 1348 09/19/23 0509  HGB  --   --   --  13.0 14.2  --   --   HCT  --   --   --  41.9 45.0  --   --   PLT  --   --   --  270 290  --   --   HEPARINUNFRC  --  0.26*   < > 0.68 >1.10* 0.55 0.59  CREATININE  --   --   --  0.67 0.67  --   --   TROPONINIHS 1,333* 1,250*  --  679*  --   --   --    < > = values in this interval not displayed.    Estimated Creatinine Clearance: 76.7 mL/min (by C-G formula based on SCr of 0.67 mg/dL).   Medical History: Past Medical History:  Diagnosis Date   Arthritis    COPD (chronic obstructive pulmonary disease) (HCC)    Ulcer     Assessment: ARLIE POSCH is a 63 y.o. year old female admitted on 09/16/2023 with NSTEMI. No anticoagulation prior to admission on dispense history. Pharmacy consulted to dose heparin.  Heparin level therapeutic at 0.59 Plans noted for cath today  Goal of Therapy:  Heparin level 0.3-0.7 units/ml Monitor platelets by anticoagulation protocol: Yes   Plan:  Continue heparin infusion at 950 units/hr. Will follow plans post cath  Harland German, PharmD Clinical Pharmacist **Pharmacist phone directory can now be found on [email or web address] ([email or web address]).  Listed under [facility].

## 2023-09-19 NOTE — Progress Notes (Addendum)
 Patient Name: Erin Hobbs Date of Encounter: 09/19/2023 [facility] Cardiologist: Dietrich Pates, MD   Interval Summary  .    No chest pain, breathing is better but still short of breath.   Vital Signs .    Vitals:   09/18/23 2346 09/19/23 0518 09/19/23 0754 09/19/23 0816  BP: (!) 139/99 (!) 142/86  (!) 147/90  Pulse: 91 91  89  Resp: 18 16 18 18   Temp: 97.9 F (36.6 C) 98 F (36.7 C)  97.8 F (36.6 C)  TempSrc: Oral Oral  Axillary  SpO2: 100% 98%    Weight:      Height:        Intake/Output Summary (Last 24 hours) at 09/19/2023 0857 Last data filed at 09/19/2023 0000 Gross per 24 hour  Intake 386.84 ml  Output 750 ml  Net -363.16 ml      09/18/2023    6:15 PM 09/16/2023    3:18 AM 08/16/2023    9:20 AM  Last 3 Weights  Weight (lbs) 186 lb 183 lb 182 lb  Weight (kg) 84.369 kg 83.008 kg 82.555 kg      Telemetry/ECG    Sinus Rhythm - Personally Reviewed  Physical Exam .    GEN: [place] @4L , no distress but short of breath with conversation   Neck: No JVD Cardiac: RRR, no murmurs, rubs, or gallops.  Respiratory: Diminished, tight in bases, expiratory wheeze GI: Soft, nontender, non-distended  MS: No edema  Assessment & Plan .     63 y.o. female with a hx of COPD (on home O2), GERD, tobacco use who is being seen 09/16/2023 for the evaluation of elevated troponin at the request of Dr. Thomes Dinning.   NSTEMI -- presented to [facility] with chest pain, hsTn peaked at 1333. EKG showed sinus tachycardia with nonspecific changes. Did report some episodes of what she thought was GERD prior to admission.  -- echo 4/8 with poor windows, but moderately reduced LVEF along with lateral, m/d septal and apical wall hypokinesis -- transferred to [facility] for further evaluation with plans for [facility]  -- continue IV heparin, ASA, statin   COPD exacerbation Acute on Chronic respiratory failure on home O2 @2L  baseline Tobacco use -- treated with IV solumedrol, nebs and antibiotics. O2 @4L   today -- management per primary -- reports last cigarette was 3 days prior to admission  Acute HFrEF -- echo 4/8 with poor windows, but moderately reduced LVEF along with lateral, m/d septal and apical wall hypokinesis -- plans for RHC, difficult to assess volume status thought does not appear significantly volume overloaded. Suspect she may have degree of pulmonary HTN -- GDMT: on Entresto 24-26mg  BID, spiro 12.5mg  daily. Can consider SGLT2 post cath. With significant underlying lung disease will defer BB therapy for now  HLD -- LDL 96, HDL 66 -- on crestor 20mg  daily  HTN -- blood pressures elevated -- on Entresto 24-26mg  BID and spiro 12.5mg  daily. Further recs pending cardiac cath   For questions or updates, please contact [facility] Please consult [email or web address] for contact info under        Signed, Laverda Page, NP    Patient seen and examined. Agree with assessment and plan.  Patient to undergo right and left heart cardiac catheterization today.  Blood pressure this morning mildly elevated 147/90.  Sinus rhythm.  On supplemental oxygen.  No JVD.  Slightly decreased breath sounds; faint intermittent expiratory wheeze,  regular rhythm no S3 gallop.  Abdomen nontender.  Bowel sounds positive.  No significant edema.  Troponins 1180 > 1249 > 1333 > 1250 > 679.  Plan right and left heart cardiac catheterization today.  Patient aware of risks/benefits of the procedure.   Lennette Bihari, MD, [facility] 09/19/2023 12:31 PM

## 2023-09-19 NOTE — Interval H&P Note (Signed)
 History and Physical Interval Note:  09/19/2023 12:30 PM  Erin Hobbs  has presented today for surgery, with the diagnosis of non-stemi, cardiomyopathy  The various methods of treatment have been discussed with the patient and family. After consideration of risks, benefits and other options for treatment, the patient has consented to  Procedure(s): RIGHT/LEFT HEART CATH AND CORONARY ANGIOGRAPHY (N/A) PERCUTANEOUS CORONARY INTERVENTION   as a surgical intervention.  The patient's history has been reviewed, patient examined, no change in status, stable for surgery.  I have reviewed the patient's chart and labs.  Questions were answered to the patient's satisfaction.    Cath Lab Visit (complete for each Cath Lab visit)  Clinical Evaluation Leading to the Procedure:   ACS: Yes.    Non-ACS:    Anginal Classification: CCS III  Anti-ischemic medical therapy: Minimal Therapy (1 class of medications)  Non-Invasive Test Results: No non-invasive testing performed  Prior CABG: No previous CABG    Bryan Lemma

## 2023-09-20 DIAGNOSIS — J441 Chronic obstructive pulmonary disease with (acute) exacerbation: Secondary | ICD-10-CM | POA: Diagnosis not present

## 2023-09-20 DIAGNOSIS — I214 Non-ST elevation (NSTEMI) myocardial infarction: Secondary | ICD-10-CM | POA: Diagnosis not present

## 2023-09-20 DIAGNOSIS — J9621 Acute and chronic respiratory failure with hypoxia: Secondary | ICD-10-CM | POA: Diagnosis not present

## 2023-09-20 LAB — BASIC METABOLIC PANEL WITH GFR
Anion gap: 9 (ref 5–15)
BUN: 22 mg/dL (ref 8–23)
CO2: 30 mmol/L (ref 22–32)
Calcium: 8.4 mg/dL — ABNORMAL LOW (ref 8.9–10.3)
Chloride: 98 mmol/L (ref 98–111)
Creatinine, Ser: 0.69 mg/dL (ref 0.44–1.00)
GFR, Estimated: >60 mL/min (ref >60)
Glucose, Bld: 180 mg/dL — ABNORMAL HIGH (ref 70–99)
Potassium: 4.9 mmol/L (ref 3.5–5.1)
Sodium: 137 mmol/L (ref 135–145)

## 2023-09-20 LAB — CBC
HCT: 45.7 % (ref 36.0–46.0)
Hemoglobin: 14.6 g/dL (ref 12.0–15.0)
MCH: 28.3 pg (ref 26.0–34.0)
MCHC: 31.9 g/dL (ref 30.0–36.0)
MCV: 88.6 fL (ref 80.0–100.0)
Platelets: 247 10*3/uL (ref 150–400)
RBC: 5.16 MIL/uL — ABNORMAL HIGH (ref 3.87–5.11)
RDW: 14 % (ref 11.5–15.5)
WBC: 16.8 10*3/uL — ABNORMAL HIGH (ref 4.0–10.5)
nRBC: 0 % (ref 0.0–0.2)

## 2023-09-20 MED ORDER — ISOSORBIDE MONONITRATE ER 30 MG PO TB24
30.0000 mg | ORAL_TABLET | Freq: Every day | ORAL | Status: DC
  Administered 2023-09-20 – 2023-09-21 (×2): 30 mg via ORAL
  Filled 2023-09-20 (×2): qty 1

## 2023-09-20 MED ORDER — SENNOSIDES-DOCUSATE SODIUM 8.6-50 MG PO TABS
2.0000 | ORAL_TABLET | Freq: Two times a day (BID) | ORAL | Status: DC
  Administered 2023-09-20 – 2023-09-21 (×2): 2 via ORAL
  Filled 2023-09-20 (×2): qty 2

## 2023-09-20 MED ORDER — POLYETHYLENE GLYCOL 3350 17 G PO PACK
17.0000 g | PACK | Freq: Every day | ORAL | Status: DC
  Administered 2023-09-20 – 2023-09-21 (×2): 17 g via ORAL
  Filled 2023-09-20 (×2): qty 1

## 2023-09-20 MED ORDER — FUROSEMIDE 10 MG/ML IJ SOLN
20.0000 mg | Freq: Once | INTRAMUSCULAR | Status: AC
  Administered 2023-09-20: 20 mg via INTRAVENOUS
  Filled 2023-09-20: qty 2

## 2023-09-20 NOTE — Progress Notes (Signed)
 [facility] PROGRESS NOTE   Erin Hobbs BJY:[id number] DOB: 04-May-1961 DOA: 09/16/2023  PCP: Chares Commons, PA-C  Brief History: 63 y.o. female with medical history significant for COPD with chronic hypoxemic respiratory failure and tobacco abuse who presented to the ED for worsening shortness of breath and cough.  Patient was hospitalized for COPD exacerbation.  However she was also found to have significant troponin elevation.    Consultants: Cardiology  Procedures: Echocardiogram.  Cardiac catheterization    Subjective/Interval History: Patient mentioned that her shortness of breath is improving.  Denies any chest pain.  No nausea or vomiting.    Assessment/Plan:  Acute on chronic respiratory failure  Acute COPD exacerbation  Patient was started on nebulizer treatments and steroids. Remains on Solu-Medrol.  Was also given doxycycline. Uses 2 L of oxygen by nasal cannula at home.  Currently on 4 to 5 L.  Try to wean down to maintain sats greater than 88%. Outpatient referral to pulmonology. Lung exam has improved in the last 24 hours.  Continue current treatment for now.  Anticipate decreasing dose of steroids from tomorrow.  NSTEMI Troponin peaked at 1250.  Echocardiogram showed low EF with wall motion abnormalities. Seen by cardiology.  Patient underwent cardiac catheterization without significant CAD.  Did not require PCI.  Coronary vasospasm was noted. Patient on aspirin.  Vasodilator will be deferred to cardiology. IV heparin discontinued. Continue statin.  Newly diagnosed cardiomyopathy/chronic acute CHF Depressed EF noted on echocardiogram. Patient has been started on Entresto and Aldactone.   Patient not on loop diuretics. Management per cardiology.  Await their input this morning.    GERD Protonix.  Leukocytosis Secondary to steroids.  Tobacco abuse Counseled.  Obesity Estimated body mass index is 31.93 kg/m as calculated from the  following:   Height as of this encounter: 5\' 4"  (1.626 m).   Weight as of this encounter: 84.4 kg.   DVT Prophylaxis: Subcutaneous heparin Code Status: Full code Family Communication: Discussed with patient Disposition Plan: Hopefully return home when improved.  Start mobilizing  Status is: Inpatient Remains inpatient appropriate because: COPD exacerbation, NSTEMI      Medications: Scheduled:  arformoterol  15 mcg Nebulization BID   aspirin EC  81 mg Oral Daily   budesonide (PULMICORT) nebulizer solution  0.25 mg Nebulization BID   doxycycline  100 mg Oral Q12H   guaiFENesin  1,200 mg Oral BID   heparin  5,000 Units Subcutaneous Q8H   methylPREDNISolone (SOLU-MEDROL) injection  40 mg Intravenous Q12H   nicotine  21 mg Transdermal Daily   nystatin  5 mL Mouth/Throat QID   pantoprazole  40 mg Oral BID   revefenacin  175 mcg Nebulization Daily   rosuvastatin  20 mg Oral Daily   sacubitril-valsartan  1 tablet Oral BID   sodium chloride flush  3 mL Intravenous Q12H   spironolactone  12.5 mg Oral Daily   Continuous:  sodium chloride     PRN:sodium chloride, acetaminophen **OR** acetaminophen, ALPRAZolam, benzonatate, HYDROcodone-acetaminophen, ipratropium-albuterol, nitroGLYCERIN, ondansetron **OR** ondansetron (ZOFRAN) IV, mouth rinse, sodium chloride flush  Antibiotics: Anti-infectives (From admission, onward)    Start     Dose/Rate Route Frequency Ordered Stop   09/18/23 1715  fluconazole (DIFLUCAN) tablet 150 mg        150 mg Oral  Once 09/18/23 1625 09/18/23 1658   09/17/23 2200  doxycycline (VIBRA-TABS) tablet 100 mg        100 mg Oral Every 12 hours 09/17/23 1524  09/16/23 1030  azithromycin (ZITHROMAX) 500 mg in sodium chloride 0.9 % 250 mL IVPB  Status:  Discontinued        500 mg 250 mL/hr over 60 Minutes Intravenous Every 24 hours 09/16/23 0930 09/17/23 1524       Objective:  Vital Signs  Vitals:   09/19/23 2029 09/19/23 2043 09/19/23 2204 09/20/23  0404  BP:  (!) 150/79 (!) 152/83 (!) 157/89  Pulse:  (!) 104 (!) 101 83  Resp:    20  Temp:    98.1 F (36.7 C)  TempSrc:    Oral  SpO2: 96% 96% 98% 98%  Weight:      Height:        Intake/Output Summary (Last 24 hours) at 09/20/2023 1050 Last data filed at 09/20/2023 0931 Gross per 24 hour  Intake 243 ml  Output 400 ml  Net -157 ml   Filed Weights   09/16/23 0318 09/18/23 1815  Weight: 83 kg 84.4 kg    General appearance: Awake alert.  In no distress Resp: Improved air entry bilaterally with less wheezing today compared to yesterday.  Few crackles at the bases. Cardio: S1-S2 is normal regular.  No S3-S4.  No rubs murmurs or bruit GI: Abdomen is soft.  Nontender nondistended.  Bowel sounds are present normal.  No masses organomegaly   Lab Results:  Data Reviewed: I have personally reviewed following labs and reports of the imaging studies  CBC: Recent Labs  Lab 09/16/23 0304 09/17/23 0407 09/18/23 0419 09/19/23 0509 09/19/23 1241 09/19/23 1251 09/19/23 1252 09/19/23 1434 09/20/23 0628  WBC 13.4* 11.8* 16.7* 16.7*  --   --   --  16.9* 16.8*  NEUTROABS 8.5*  --   --   --   --   --   --   --   --   HGB 13.8 13.0 14.2 15.1* 15.3* 15.3* 15.0 14.8 14.6  HCT 44.1 41.9 45.0 47.8* 45.0 45.0 44.0 45.9 45.7  MCV 90.6 90.9 90.5 89.2  --   --   --  88.6 88.6  PLT 283 270 290 271  --   --   --  259 247    Basic Metabolic Panel: Recent Labs  Lab 09/16/23 0304 09/17/23 0407 09/18/23 0419 09/19/23 0509 09/19/23 1241 09/19/23 1251 09/19/23 1252 09/19/23 1434 09/20/23 0628  NA 140 137 136 139 137 138 138  --  137  K 3.9 4.4 4.3 4.8 4.6 4.6 4.6  --  4.9  CL 103 101 98 97*  --   --   --   --  98  CO2 26 28 32 33*  --   --   --   --  30  GLUCOSE 110* 152* 204* 173*  --   --   --   --  180*  BUN 18 17 21 17   --   --   --   --  22  CREATININE 0.79 0.67 0.67 0.77  --   --   --  0.79 0.69  CALCIUM 8.6* 8.7* 8.5* 8.7*  --   --   --   --  8.4*  MG  --  2.8*  --   --   --    --   --   --   --     GFR: Estimated Creatinine Clearance: 76.7 mL/min (by C-G formula based on SCr of 0.69 mg/dL).    Recent Results (from the past 240 hours)  Resp panel by RT-PCR (RSV,  Flu A&B, Covid) Anterior Nasal Swab     Status: None   Collection Time: 09/16/23  3:27 AM   Specimen: Anterior Nasal Swab  Result Value Ref Range Status   SARS Coronavirus 2 by RT PCR NEGATIVE NEGATIVE Final    Comment: (NOTE) SARS-CoV-2 target nucleic acids are NOT DETECTED.  The SARS-CoV-2 RNA is generally detectable in upper respiratory specimens during the acute phase of infection. The lowest concentration of SARS-CoV-2 viral copies this assay can detect is 138 copies/mL. A negative result does not preclude SARS-Cov-2 infection and should not be used as the sole basis for treatment or other patient management decisions. A negative result may occur with  improper specimen collection/handling, submission of specimen other than nasopharyngeal swab, presence of viral mutation(s) within the areas targeted by this assay, and inadequate number of viral copies(<138 copies/mL). A negative result must be combined with clinical observations, patient history, and epidemiological information. The expected result is Negative.  Fact Sheet for Patients:  BloggerCourse.com  Fact Sheet for Healthcare Providers:  SeriousBroker.it  This test is no t yet approved or cleared by the [place]  FDA and  has been authorized for detection and/or diagnosis of SARS-CoV-2 by FDA under an Emergency Use Authorization (EUA). This EUA will remain  in effect (meaning this test can be used) for the duration of the COVID-19 declaration under Section 564(b)(1) of the Act, 21 U.S.C.section 360bbb-3(b)(1), unless the authorization is terminated  or revoked sooner.       Influenza A by PCR NEGATIVE NEGATIVE Final   Influenza B by PCR NEGATIVE NEGATIVE Final    Comment:  (NOTE) The Xpert Xpress SARS-CoV-2/FLU/RSV plus assay is intended as an aid in the diagnosis of influenza from Nasopharyngeal swab specimens and should not be used as a sole basis for treatment. Nasal washings and aspirates are unacceptable for Xpert Xpress SARS-CoV-2/FLU/RSV testing.  Fact Sheet for Patients: BloggerCourse.com  Fact Sheet for Healthcare Providers: SeriousBroker.it  This test is not yet approved or cleared by the [place]  FDA and has been authorized for detection and/or diagnosis of SARS-CoV-2 by FDA under an Emergency Use Authorization (EUA). This EUA will remain in effect (meaning this test can be used) for the duration of the COVID-19 declaration under Section 564(b)(1) of the Act, 21 U.S.C. section 360bbb-3(b)(1), unless the authorization is terminated or revoked.     Resp Syncytial Virus by PCR NEGATIVE NEGATIVE Final    Comment: (NOTE) Fact Sheet for Patients: BloggerCourse.com  Fact Sheet for Healthcare Providers: SeriousBroker.it  This test is not yet approved or cleared by the [place]  FDA and has been authorized for detection and/or diagnosis of SARS-CoV-2 by FDA under an Emergency Use Authorization (EUA). This EUA will remain in effect (meaning this test can be used) for the duration of the COVID-19 declaration under Section 564(b)(1) of the Act, 21 U.S.C. section 360bbb-3(b)(1), unless the authorization is terminated or revoked.  Performed at [facility], [address], [place], [place] [address]       Radiology Studies: CARDIAC CATHETERIZATION Result Date: 09/19/2023 Images from the original result were not included. Dominance: Right Angiographically minimal CAD with significant catheter related spasm in the RCA relieved with nitroglycerin. 30% ostial to proximal RCA with another focal 20% proximal RCA, and 20% proximal LAD.  Hemodynamics: LV P MD BP 152/4-16 mmHg; AO P-MAP 146/78-108 mmHg RHC: RAP 14 mmHg, RV P-EDP 42/10-19 mmHg; PA-mean 40/19-28 mmHg, PCWP 28 mmHg; AO sat 93%, PA sat 66%. Fick CO-CI 4.83-2.55. RECOMMENDATIONS  Anticipated discharge date to be determined.   Patient likely needs additional diuresis Would consider long-acting vasodilator in addition to GDMT for reduced EF, based on extent of spasm.   Recommend Aspirin 81mg  daily for moderate CAD. Randene Bustard, MD      LOS: 3 days   Maylene Spear  [facility] Pager on [email or web address]  09/20/2023, 10:50 AM

## 2023-09-20 NOTE — Progress Notes (Signed)
   Patient Name: Erin Hobbs Date of Encounter: 09/20/2023 [facility] Cardiologist: Ola Berger, MD   Interval Summary  .    No chest pain, breathing is much better.   Vital Signs .    Vitals:   09/19/23 2029 09/19/23 2043 09/19/23 2204 09/20/23 0404  BP:  (!) 150/79 (!) 152/83 (!) 157/89  Pulse:  (!) 104 (!) 101 83  Resp:    20  Temp:    98.1 F (36.7 C)  TempSrc:    Oral  SpO2: 96% 96% 98% 98%  Weight:      Height:        Intake/Output Summary (Last 24 hours) at 09/20/2023 1152 Last data filed at 09/20/2023 0931 Gross per 24 hour  Intake 243 ml  Output 400 ml  Net -157 ml      09/18/2023    6:15 PM 09/16/2023    3:18 AM 08/16/2023    9:20 AM  Last 3 Weights  Weight (lbs) 186 lb 183 lb 182 lb  Weight (kg) 84.369 kg 83.008 kg 82.555 kg      Telemetry/ECG    Sinus Rhythm - Personally Reviewed  Physical Exam .    GEN: [place] @4L , no distress but short of breath with conversation   Neck: No JVD Cardiac: RRR, no murmurs, rubs, or gallops.  Respiratory: Diminished, tight in bases, expiratory wheeze GI: Soft, nontender, non-distended  MS: No edema  Assessment & Plan .     63 y.o. female with a hx of COPD (on home O2), GERD, tobacco use who is being seen 09/16/2023 for the evaluation of elevated troponin at the request of Dr. Elyse Hand.   NSTEMI -- presented to [facility] with chest pain, hsTn peaked at 1333. EKG showed sinus tachycardia with nonspecific changes. Did report some episodes of what she thought was GERD prior to admission.  -- echo 4/8 with poor windows, but moderately reduced LVEF along with lateral, m/d septal and apical wall hypokinesis -- Angiographically minimal disease on cath with significant catheter related spasm -- continue ASA, statin  -- add long acting nitrate - imdur 30  Acute HFrEF -- echo 4/8 with poor windows, but moderately reduced LVEF along with lateral, m/d septal and apical wall hypokinesis -- LVED slightly elevated -- will give  IV lasix one dose today -- can start jardiance 10 -- GDMT: on Entresto 24-26mg  BID, spiro 12.5mg  daily. Can consider SGLT2 post cath. With significant underlying lung disease will defer BB therapy for now   COPD exacerbation Acute on Chronic respiratory failure on home O2 @2L  baseline Tobacco use -- treated with IV solumedrol, nebs and antibiotics. O2 @4L  today -- management per primary -- reports last cigarette was 3 days prior to admission    HLD -- LDL 96, HDL 66 -- on crestor 20mg  daily  HTN -- blood pressures elevated -- on Entresto 24-26mg  BID and spiro 12.5mg  daily. Further recs pending cardiac cath   For questions or updates, please contact [facility] Please consult [email or web address] for contact info under        Signed, Efraim Grange, MD

## 2023-09-21 DIAGNOSIS — J441 Chronic obstructive pulmonary disease with (acute) exacerbation: Secondary | ICD-10-CM | POA: Diagnosis not present

## 2023-09-21 LAB — BASIC METABOLIC PANEL WITH GFR
Anion gap: 10 (ref 5–15)
BUN: 25 mg/dL — ABNORMAL HIGH (ref 8–23)
CO2: 31 mmol/L (ref 22–32)
Calcium: 8.6 mg/dL — ABNORMAL LOW (ref 8.9–10.3)
Chloride: 96 mmol/L — ABNORMAL LOW (ref 98–111)
Creatinine, Ser: 0.88 mg/dL (ref 0.44–1.00)
GFR, Estimated: >60 mL/min (ref >60)
Glucose, Bld: 243 mg/dL — ABNORMAL HIGH (ref 70–99)
Potassium: 4.8 mmol/L (ref 3.5–5.1)
Sodium: 137 mmol/L (ref 135–145)

## 2023-09-21 MED ORDER — ROSUVASTATIN CALCIUM 20 MG PO TABS: 20.0000 mg | ORAL_TABLET | Freq: Every day | ORAL | 2 refills | Status: AC

## 2023-09-21 MED ORDER — DAPAGLIFLOZIN PROPANEDIOL 10 MG PO TABS
10.0000 mg | ORAL_TABLET | Freq: Every day | ORAL | 2 refills | Status: AC
Start: 2023-09-21 — End: ?

## 2023-09-21 MED ORDER — ISOSORBIDE MONONITRATE ER 30 MG PO TB24: 30.0000 mg | ORAL_TABLET | Freq: Every day | ORAL | 2 refills | Status: AC

## 2023-09-21 MED ORDER — SACUBITRIL-VALSARTAN 24-26 MG PO TABS: 1.0000 | ORAL_TABLET | Freq: Two times a day (BID) | ORAL | 1 refills | Status: AC

## 2023-09-21 MED ORDER — FUROSEMIDE 20 MG PO TABS: 20.0000 mg | ORAL_TABLET | Freq: Every day | ORAL | 2 refills | Status: AC | PRN

## 2023-09-21 MED ORDER — SPIRONOLACTONE 25 MG PO TABS: 12.5000 mg | ORAL_TABLET | Freq: Every day | ORAL | 1 refills | Status: AC

## 2023-09-21 MED ORDER — ASPIRIN 81 MG PO TBEC: 81.0000 mg | DELAYED_RELEASE_TABLET | Freq: Every day | ORAL | 12 refills | Status: AC

## 2023-09-21 MED ORDER — PREDNISONE 20 MG PO TABS: ORAL_TABLET | ORAL | 0 refills | Status: AC

## 2023-09-21 MED ORDER — PREDNISONE 20 MG PO TABS: 40.0000 mg | ORAL_TABLET | Freq: Every day | ORAL | Status: DC

## 2023-09-21 NOTE — Progress Notes (Signed)
   Patient Name: Erin Hobbs Date of Encounter: 09/21/2023 [facility] Cardiologist: Ola Berger, MD   Interval Summary  .    No chest pain, breathing is much better.   Vital Signs .    Vitals:   09/20/23 1637 09/20/23 1924 09/21/23 0300 09/21/23 0741  BP:  139/85 (!) 142/85   Pulse:  (!) 104 74   Resp:  18 18   Temp: 98.2 F (36.8 C) 98.6 F (37 C) 98.1 F (36.7 C)   TempSrc: Oral Oral Oral   SpO2:  95% 98% 91%  Weight:      Height:        Intake/Output Summary (Last 24 hours) at 09/21/2023 0753 Last data filed at 09/21/2023 0424 Gross per 24 hour  Intake 3 ml  Output 2175 ml  Net -2172 ml      09/18/2023    6:15 PM 09/16/2023    3:18 AM 08/16/2023    9:20 AM  Last 3 Weights  Weight (lbs) 186 lb 183 lb 182 lb  Weight (kg) 84.369 kg 83.008 kg 82.555 kg      Telemetry/ECG    Sinus Rhythm - Personally Reviewed  Physical Exam .    GEN: [place] @4L , no distress but short of breath with conversation   Neck: No JVD Cardiac: RRR, no murmurs, rubs, or gallops.  Respiratory: Diminished, tight in bases, expiratory wheeze GI: Soft, nontender, non-distended  MS: No edema  Assessment & Plan .     63 y.o. female with a hx of COPD (on home O2), GERD, tobacco use who is being seen 09/16/2023 for the evaluation of elevated troponin at the request of Dr. Elyse Hand.   NSTEMI -- presented to [facility] with chest pain, hsTn peaked at 1333. EKG showed sinus tachycardia with nonspecific changes. Did report some episodes of what she thought was GERD prior to admission.  -- echo 4/8 with poor windows, but moderately reduced LVEF along with lateral, m/d septal and apical wall hypokinesis -- Angiographically minimal disease on cath with significant catheter related spasm -- continue ASA, statin  -- imdur 30 added, can increase if needed  Acute HFrEF -- echo 4/8 with poor windows, but moderately reduced LVEF along with lateral, m/d septal and apical wall hypokinesis -- LVEDP slightly  elevated -- one dose of lasix given with good response -- can start jardiance 10 -- GDMT: on Entresto 24-26mg  BID, spiro 12.5mg  daily.  -- With significant underlying lung disease will defer BB therapy for now, but ideally, she would be on metoprolol XL or carvedilol   COPD exacerbation Acute on Chronic respiratory failure on home O2 @2L  baseline Tobacco use -- treated with IV solumedrol, nebs and antibiotics. O2 @2L  today -- management per primary -- reports last cigarette was 3 days prior to admission  HLD -- LDL 96, HDL 66 -- on crestor 20mg  daily  HTN -- blood pressures improved but elevated -- on Entresto 24-26mg  BID and spiro 12.5mg  daily.  For questions or updates, please contact  [facility] Please consult [email or web address] for contact info under        Signed, Efraim Grange, MD

## 2023-09-21 NOTE — Discharge Summary (Signed)
 [facility]  Physician Discharge Summary   Patient ID: Erin Hobbs MRN: [id number] DOB/AGE: [birth year] 63 y.o.  Admit date: 09/16/2023 Discharge date: 09/21/2023    PCP: Chares Commons, PA-C  DISCHARGE DIAGNOSES:    Acute exacerbation of chronic obstructive pulmonary disease (COPD) (HCC)   Gastroesophageal reflux disease   Class 1 obesity   Tobacco abuse   Acute on chronic hypoxic respiratory failure (HCC)   Non-ST elevation (NSTEMI) myocardial infarction (HCC)   RECOMMENDATIONS FOR OUTPATIENT FOLLOW UP: Cardiology to schedule outpatient follow-up Ambulatory referral sent to pulmonology   Home Health: None Equipment/Devices: None  CODE STATUS: Full code  DISCHARGE CONDITION: fair  Diet recommendation: As before  INITIAL HISTORY: 63 y.o. female with medical history significant for COPD with chronic hypoxemic respiratory failure and tobacco abuse who presented to the ED for worsening shortness of breath and cough.  Patient was hospitalized for COPD exacerbation.  However she was also found to have significant troponin elevation.     Consultants: Cardiology   Procedures: Echocardiogram.  Cardiac catheterization  HOSPITAL COURSE:   Acute on chronic respiratory failure  Acute COPD exacerbation  Patient was started on nebulizer treatments and steroids. Remains on Solu-Medrol.  Was also given doxycycline. Uses 2 L of oxygen by nasal cannula at home.  Was initially requiring 4 to 5 L of oxygen.  Now has been weaned down to 2 L.  Seems to have improved.  She mentions that she is on Symbicort at home.   She will need outpatient referral to pulmonology. Will be discharged on steroid taper.   NSTEMI Troponin peaked at 1250.  Echocardiogram showed low EF with wall motion abnormalities. Seen by cardiology.  Patient underwent cardiac catheterization without significant CAD.  Did not require PCI.  Coronary vasospasm was noted. Patient on aspirin.  She has been  started on nitrates by cardiology. IV heparin discontinued. Continue statin.   Newly diagnosed cardiomyopathy/chronic acute CHF Depressed EF noted on echocardiogram. Patient has been started on Entresto and Aldactone.  Started on Jardiance.  Lasix only as needed as per cardiology.    GERD Protonix.   Leukocytosis Secondary to steroids.   Tobacco abuse Counseled.   Obesity Estimated body mass index is 31.93 kg/m as calculated from the following:   Height as of this encounter: 5\' 4"  (1.626 m).   Weight as of this encounter: 84.4 kg.  Patient very keen on going home today.  Discussed with cardiology.  They have cleared her for discharge.  Okay for discharge today.  She does have home oxygen.   PERTINENT LABS:  The results of significant diagnostics from this hospitalization (including imaging, microbiology, ancillary and laboratory) are listed below for reference.    Microbiology: Recent Results (from the past 240 hours)  Resp panel by RT-PCR (RSV, Flu A&B, Covid) Anterior Nasal Swab     Status: None   Collection Time: 09/16/23  3:27 AM   Specimen: Anterior Nasal Swab  Result Value Ref Range Status   SARS Coronavirus 2 by RT PCR NEGATIVE NEGATIVE Final    Comment: (NOTE) SARS-CoV-2 target nucleic acids are NOT DETECTED.  The SARS-CoV-2 RNA is generally detectable in upper respiratory specimens during the acute phase of infection. The lowest concentration of SARS-CoV-2 viral copies this assay can detect is 138 copies/mL. A negative result does not preclude SARS-Cov-2 infection and should not be used as the sole basis for treatment or other patient management decisions. A negative result may occur with  improper specimen collection/handling,  submission of specimen other than nasopharyngeal swab, presence of viral mutation(s) within the areas targeted by this assay, and inadequate number of viral copies(<138 copies/mL). A negative result must be combined with clinical  observations, patient history, and epidemiological information. The expected result is Negative.  Fact Sheet for Patients:  BloggerCourse.com  Fact Sheet for Healthcare Providers:  SeriousBroker.it  This test is no t yet approved or cleared by the [place]  FDA and  has been authorized for detection and/or diagnosis of SARS-CoV-2 by FDA under an Emergency Use Authorization (EUA). This EUA will remain  in effect (meaning this test can be used) for the duration of the COVID-19 declaration under Section 564(b)(1) of the Act, 21 U.S.C.section 360bbb-3(b)(1), unless the authorization is terminated  or revoked sooner.       Influenza A by PCR NEGATIVE NEGATIVE Final   Influenza B by PCR NEGATIVE NEGATIVE Final    Comment: (NOTE) The Xpert Xpress SARS-CoV-2/FLU/RSV plus assay is intended as an aid in the diagnosis of influenza from Nasopharyngeal swab specimens and should not be used as a sole basis for treatment. Nasal washings and aspirates are unacceptable for Xpert Xpress SARS-CoV-2/FLU/RSV testing.  Fact Sheet for Patients: BloggerCourse.com  Fact Sheet for Healthcare Providers: SeriousBroker.it  This test is not yet approved or cleared by the [place]  FDA and has been authorized for detection and/or diagnosis of SARS-CoV-2 by FDA under an Emergency Use Authorization (EUA). This EUA will remain in effect (meaning this test can be used) for the duration of the COVID-19 declaration under Section 564(b)(1) of the Act, 21 U.S.C. section 360bbb-3(b)(1), unless the authorization is terminated or revoked.     Resp Syncytial Virus by PCR NEGATIVE NEGATIVE Final    Comment: (NOTE) Fact Sheet for Patients: BloggerCourse.com  Fact Sheet for Healthcare Providers: SeriousBroker.it  This test is not yet approved or cleared by  the [place]  FDA and has been authorized for detection and/or diagnosis of SARS-CoV-2 by FDA under an Emergency Use Authorization (EUA). This EUA will remain in effect (meaning this test can be used) for the duration of the COVID-19 declaration under Section 564(b)(1) of the Act, 21 U.S.C. section 360bbb-3(b)(1), unless the authorization is terminated or revoked.  Performed at [facility], [address], [place], [place] [address]      Labs:   Basic Metabolic Panel: Recent Labs  Lab 09/17/23 0407 09/18/23 0419 09/19/23 0509 09/19/23 1241 09/19/23 1251 09/19/23 1252 09/19/23 1434 09/20/23 0628 09/21/23 0932  NA 137 136 139 137 138 138  --  137 137  K 4.4 4.3 4.8 4.6 4.6 4.6  --  4.9 4.8  CL 101 98 97*  --   --   --   --  98 96*  CO2 28 32 33*  --   --   --   --  30 31  GLUCOSE 152* 204* 173*  --   --   --   --  180* 243*  BUN 17 21 17   --   --   --   --  22 25*  CREATININE 0.67 0.67 0.77  --   --   --  0.79 0.69 0.88  CALCIUM 8.7* 8.5* 8.7*  --   --   --   --  8.4* 8.6*  MG 2.8*  --   --   --   --   --   --   --   --     CBC: Recent Labs  Lab 09/16/23 0304  09/17/23 0407 09/18/23 0419 09/19/23 0509 09/19/23 1241 09/19/23 1251 09/19/23 1252 09/19/23 1434 09/20/23 0628  WBC 13.4* 11.8* 16.7* 16.7*  --   --   --  16.9* 16.8*  NEUTROABS 8.5*  --   --   --   --   --   --   --   --   HGB 13.8 13.0 14.2 15.1* 15.3* 15.3* 15.0 14.8 14.6  HCT 44.1 41.9 45.0 47.8* 45.0 45.0 44.0 45.9 45.7  MCV 90.6 90.9 90.5 89.2  --   --   --  88.6 88.6  PLT 283 270 290 271  --   --   --  259 247   BNP: BNP (last 3 results) Recent Labs    09/16/23 0939  BNP 227.0*     IMAGING STUDIES CARDIAC CATHETERIZATION Result Date: 09/19/2023 Images from the original result were not included. Dominance: Right Angiographically minimal CAD with significant catheter related spasm in the RCA relieved with nitroglycerin. 30% ostial to proximal RCA with another focal 20% proximal RCA, and  20% proximal LAD. Hemodynamics: LV P MD BP 152/4-16 mmHg; AO P-MAP 146/78-108 mmHg RHC: RAP 14 mmHg, RV P-EDP 42/10-19 mmHg; PA-mean 40/19-28 mmHg, PCWP 28 mmHg; AO sat 93%, PA sat 66%. Fick CO-CI 4.83-2.55. RECOMMENDATIONS   Anticipated discharge date to be determined.   Patient likely needs additional diuresis Would consider long-acting vasodilator in addition to GDMT for reduced EF, based on extent of spasm.   Recommend Aspirin 81mg  daily for moderate CAD. Randene Bustard, MD  ECHOCARDIOGRAM COMPLETE Result Date: 09/16/2023    ECHOCARDIOGRAM REPORT   Patient Name:   Janika Trish Furl Date of Exam: 09/16/2023 Medical Rec #:  [id number]       Height:       64.0 in Accession #:    [id number]      Weight:       183.0 lb Date of Birth:  09-Feb-1961       BSA:          1.884 m Patient Age:    62 years        BP:           141/94 mmHg Patient Gender: F               HR:           93 bpm. Exam Location:  [facility] Procedure: 2D Echo, Cardiac Doppler, Color Doppler and Intracardiac            Opacification Agent (Both Spectral and Color Flow Doppler were            utilized during procedure). Indications:    Elevated troponin  History:        Patient has no prior history of Echocardiogram examinations.                 COPD.  Sonographer:    Juanita Shaw Referring Phys: [id number] Metta Actis  Sonographer Comments: Image acquisition challenging due to patient body habitus and Image acquisition challenging due to respiratory motion. IMPRESSIONS  1. Poor acoustic windows limit study, even with Definity. LVEF is moderately depressed with hypokinesis of the distal lateral, mid/distal septal, distal anterior and apical walls.  2. Right ventricular systolic function is normal. The right ventricular size is normal.  3. The mitral valve is grossly normal. No evidence of mitral valve regurgitation.  4. The aortic valve was not well visualized. Aortic valve regurgitation is not visualized. FINDINGS  Left  Ventricle: Poor acoustic  windows limit study, even with Definity. LVEF is moderately depressed with hypokinesis of the distal lateral, mid/distal septal, distal anterior and apical walls. The left ventricular internal cavity size was normal in size. There is no left ventricular hypertrophy. Right Ventricle: The right ventricular size is normal. Right vetricular wall thickness was not assessed. Right ventricular systolic function is normal. Left Atrium: Left atrial size was not well visualized. Right Atrium: Right atrial size was normal in size. Pericardium: There is no evidence of pericardial effusion. Mitral Valve: The mitral valve is grossly normal. No evidence of mitral valve regurgitation. MV peak gradient, 4.1 mmHg. The mean mitral valve gradient is 2.0 mmHg. Tricuspid Valve: The tricuspid valve is normal in structure. Tricuspid valve regurgitation is not demonstrated. Aortic Valve: The aortic valve was not well visualized. Aortic valve regurgitation is not visualized. Aortic valve mean gradient measures 3.0 mmHg. Aortic valve peak gradient measures 5.2 mmHg. Aortic valve area, by VTI measures 2.15 cm. Pulmonic Valve: The pulmonic valve was not well visualized. Aorta: The aortic root was not well visualized.  LEFT VENTRICLE PLAX 2D LVIDd:         4.10 cm      Diastology LVIDs:         2.80 cm      LV e' medial:    9.68 cm/s LV PW:         0.90 cm      LV E/e' medial:  7.4 LV IVS:        0.70 cm      LV e' lateral:   11.20 cm/s LVOT diam:     2.00 cm      LV E/e' lateral: 6.4 LV SV:         39 LV SV Index:   21 LVOT Area:     3.14 cm  LV Volumes (MOD) LV vol d, MOD A4C: 149.0 ml LV vol s, MOD A4C: 66.0 ml LV SV MOD A4C:     149.0 ml RIGHT VENTRICLE             IVC RV Basal diam:  2.90 cm     IVC diam: 1.70 cm RV Mid diam:    2.20 cm RV S prime:     13.60 cm/s TAPSE (M-mode): 1.8 cm LEFT ATRIUM             Index        RIGHT ATRIUM          Index LA diam:        3.30 cm 1.75 cm/m   RA Area:     5.16 cm LA Vol (A2C):   19.8 ml 10.51  ml/m  RA Volume:   6.65 ml  3.53 ml/m LA Vol (A4C):   14.7 ml 7.80 ml/m LA Biplane Vol: 19.0 ml 10.09 ml/m  AORTIC VALVE                    PULMONIC VALVE AV Area (Vmax):    2.36 cm     PV Vmax:       0.99 m/s AV Area (Vmean):   2.09 cm     PV Peak grad:  3.9 mmHg AV Area (VTI):     2.15 cm AV Vmax:           114.00 cm/s AV Vmean:          76.700 cm/s AV VTI:  0.181 m AV Peak Grad:      5.2 mmHg AV Mean Grad:      3.0 mmHg LVOT Vmax:         85.50 cm/s LVOT Vmean:        51.000 cm/s LVOT VTI:          0.124 m LVOT/AV VTI ratio: 0.69  AORTA Ao Root diam: 3.20 cm Ao Asc diam:  2.60 cm MITRAL VALVE MV Area (PHT): 5.75 cm    SHUNTS MV Area VTI:   1.94 cm    Systemic VTI:  0.12 m MV Peak grad:  4.1 mmHg    Systemic Diam: 2.00 cm MV Mean grad:  2.0 mmHg MV Vmax:       1.01 m/s MV Vmean:      71.9 cm/s MV Decel Time: 132 msec MV E velocity: 71.70 cm/s MV A velocity: 74.40 cm/s MV E/A ratio:  0.96 Ola Berger MD Electronically signed by Ola Berger MD Signature Date/Time: 09/16/2023/5:31:58 PM    Final    DG Chest Portable 1 View Result Date: 09/16/2023 CLINICAL DATA:  Cough EXAM: PORTABLE CHEST 1 VIEW COMPARISON:  08/16/2023 FINDINGS: Hyperinflation and apical lucency. There is no edema, consolidation, effusion, or pneumothorax. Artifact from EKG leads. Normal heart size and mediastinal contours. IMPRESSION: COPD without acute superimposed finding. Electronically Signed   By: Ronnette Coke M.D.   On: 09/16/2023 04:38    DISCHARGE EXAMINATION: Vitals:   09/21/23 0300 09/21/23 0741 09/21/23 0930 09/21/23 1214  BP: (!) 142/85  (!) 166/91 (!) 148/90  Pulse: 74   (!) 106  Resp: 18   18  Temp: 98.1 F (36.7 C)   98.3 F (36.8 C)  TempSrc: Oral   Oral  SpO2: 98% 91%  93%  Weight:      Height:       General appearance: Awake alert.  In no distress Resp: Normal effort at rest.  Coarse breath sounds bilaterally.  Few scattered wheezes.  Few crackles at the bases. Cardio: S1-S2 is normal regular.   No S3-S4.  No rubs murmurs or bruit GI: Abdomen is soft.  Nontender nondistended.  Bowel sounds are present normal.  No masses organomegaly  DISPOSITION: Home  Discharge Instructions     (HEART FAILURE PATIENTS) Call MD:  Anytime you have any of the following symptoms: 1) 3 pound weight gain in 24 hours or 5 pounds in 1 week 2) shortness of breath, with or without a dry hacking cough 3) swelling in the hands, feet or stomach 4) if you have to sleep on extra pillows at night in order to breathe.   Complete by: As directed    AMB referral to Phase II Cardiac Rehabilitation   Complete by: As directed    Diagnosis: Heart Failure (see criteria below if ordering Phase II)   Heart Failure Type: Chronic Systolic & Diastolic   After initial evaluation and assessments completed: Virtual Based Care may be provided alone or in conjunction with Phase 2 Cardiac Rehab based on patient barriers.: Yes   Intensive Cardiac Rehabilitation (ICR) [facility] location only OR Traditional Cardiac Rehabilitation (TCR) *If criteria for ICR are not met will enroll in TCR [facility] only): Yes   Call MD for:  difficulty breathing, headache or visual disturbances   Complete by: As directed    Call MD for:  hives   Complete by: As directed    Call MD for:  persistant dizziness or light-headedness   Complete by: As directed  Call MD for:  persistant nausea and vomiting   Complete by: As directed    Call MD for:  severe uncontrolled pain   Complete by: As directed    Call MD for:  temperature >100.4   Complete by: As directed    Diet - low sodium heart healthy   Complete by: As directed    Discharge instructions   Complete by: As directed    Please take your medications as prescribed.  You were cared for by a hospitalist during your hospital stay. If you have any questions about your discharge medications or the care you received while you were in the hospital after you are discharged, you can call the unit and asked to speak  with the hospitalist on call if the hospitalist that took care of you is not available. Once you are discharged, your primary care physician will handle any further medical issues. Please note that NO REFILLS for any discharge medications will be authorized once you are discharged, as it is imperative that you return to your primary care physician (or establish a relationship with a primary care physician if you do not have one) for your aftercare needs so that they can reassess your need for medications and monitor your lab values. If you do not have a primary care physician, you can call [phone] for a physician referral.   Increase activity slowly   Complete by: As directed    Pulmonary Visit   Complete by: As directed    COPD   Reason for referral: Other Pulmonary         Allergies as of 09/21/2023       Reactions   Aspirin Other (See Comments)   Cannot take due to stomach ulcer   Mushroom Extract Complex (obsolete) Other (See Comments)   Dizziness         Medication List     TAKE these medications    acetaminophen 500 MG tablet Commonly known as: TYLENOL Take 1,000 mg by mouth every 6 (six) hours as needed for fever or mild pain (pain score 1-3).   ALPRAZolam 0.25 MG tablet Commonly known as: XANAX Take 0.25 mg by mouth at bedtime as needed for sleep.   aspirin EC 81 MG tablet Take 1 tablet (81 mg total) by mouth daily. Swallow whole.   benzonatate 100 MG capsule Commonly known as: TESSALON Take 1 capsule (100 mg total) by mouth 2 (two) times daily as needed for cough.   budesonide-formoterol 160-4.5 MCG/ACT inhaler Commonly known as: SYMBICORT Inhale 2 puffs into the lungs 2 (two) times daily.   dapagliflozin propanediol 10 MG Tabs tablet Commonly known as: Farxiga Take 1 tablet (10 mg total) by mouth daily before breakfast.   furosemide 20 MG tablet Commonly known as: Lasix Take 1 tablet (20 mg total) by mouth daily as needed for fluid (for 3lbs weight gain  in 1 day or 5lbs weight gain over a week.).   HYDROcodone-acetaminophen 10-325 MG tablet Commonly known as: NORCO Take 1 tablet by mouth 2 (two) times daily as needed for moderate pain (pain score 4-6).   ipratropium-albuterol 0.5-2.5 (3) MG/3ML Soln Commonly known as: DUONEB Take 3 mLs by nebulization every 6 (six) hours as needed (SOB/wheezing not improved with the use of inhaler.).   Ipratropium-Albuterol 20-100 MCG/ACT Aers respimat Commonly known as: COMBIVENT Inhale 1 puff into the lungs every 6 (six) hours.   isosorbide mononitrate 30 MG 24 hr tablet Commonly known as: IMDUR Take 1 tablet (30  mg total) by mouth daily.   omeprazole 40 MG capsule Commonly known as: PRILOSEC Take 40 mg by mouth daily.   oxybutynin 5 MG tablet Commonly known as: DITROPAN Take 5 mg by mouth 2 (two) times daily as needed for bladder spasms.   predniSONE 20 MG tablet Commonly known as: DELTASONE Take 3 tablets once daily for 3 days followed by 2 tablets once daily for 3 days followed by 1 tablet once daily for 3 days and then stop   rosuvastatin 20 MG tablet Commonly known as: CRESTOR Take 1 tablet (20 mg total) by mouth daily.   sacubitril-valsartan 24-26 MG Commonly known as: ENTRESTO Take 1 tablet by mouth 2 (two) times daily.   spironolactone 25 MG tablet Commonly known as: ALDACTONE Take 0.5 tablets (12.5 mg total) by mouth daily.   Ventolin HFA 108 (90 Base) MCG/ACT inhaler Generic drug: albuterol Inhale 2 puffs into the lungs every 4 (four) hours as needed for wheezing or shortness of breath.   Vitamin D (Ergocalciferol) 1.25 MG (50000 UNIT) Caps capsule Commonly known as: DRISDOL Take 50,000 Units by mouth every Sunday.          Follow-up Information     Chares Commons, PA-C. Schedule an appointment as soon as possible for a visit in 1 week(s).   Specialty: Physician Assistant Why: post hospitalization follow up Contact information: [address]  [place], [place] [address] [phone]                 TOTAL DISCHARGE TIME: 35 minutes  Teigen Bellin Lyndon Santiago  [facility] Pager on [email or web address]  09/22/2023, 12:03 PM

## 2023-09-21 NOTE — Progress Notes (Signed)
 [facility] PROGRESS NOTE   Erin Hobbs UEA:[id number] DOB: September 02, 1960 DOA: 09/16/2023  PCP: Chares Commons, PA-C  Brief History: 63 y.o. female with medical history significant for COPD with chronic hypoxemic respiratory failure and tobacco abuse who presented to the ED for worsening shortness of breath and cough.  Patient was hospitalized for COPD exacerbation.  However she was also found to have significant troponin elevation.    Consultants: Cardiology  Procedures: Echocardiogram.  Cardiac catheterization    Subjective/Interval History: Patient mentions that shortness of breath is better.  Denies any chest pain.  No nausea or vomiting.    Assessment/Plan:  Acute on chronic respiratory failure  Acute COPD exacerbation  Patient was started on nebulizer treatments and steroids. Remains on Solu-Medrol.  Was also given doxycycline. Uses 2 L of oxygen by nasal cannula at home.  Was initially requiring 4 to 5 L of oxygen.  Now has been weaned down to 2 L.  Seems to have improved.  She is noted to be on Brovana and budesonide nebulizer treatments.   She mentions that she is on Symbicort at home.   She will need outpatient referral to pulmonology. Change her to prednisone from tomorrow.  NSTEMI Troponin peaked at 1250.  Echocardiogram showed low EF with wall motion abnormalities. Seen by cardiology.  Patient underwent cardiac catheterization without significant CAD.  Did not require PCI.  Coronary vasospasm was noted. Patient on aspirin.  She has been started on nitrates by cardiology. IV heparin discontinued. Continue statin.  Newly diagnosed cardiomyopathy/chronic acute CHF Depressed EF noted on echocardiogram. Patient has been started on Entresto and Aldactone.  Given a dose of IV furosemide yesterday with good diuresis.  Does not appear that this has been continued today.  Awaiting labs from today.   GERD Protonix.  Leukocytosis Secondary to  steroids.  Tobacco abuse Counseled.  Obesity Estimated body mass index is 31.93 kg/m as calculated from the following:   Height as of this encounter: 5\' 4"  (1.626 m).   Weight as of this encounter: 84.4 kg.   DVT Prophylaxis: Subcutaneous heparin Code Status: Full code Family Communication: Discussed with patient Disposition Plan: Hopefully return home when improved.  Has not been mobilized yet.  Anticipate discharge in 1 to 2 days.  Status is: Inpatient Remains inpatient appropriate because: COPD exacerbation, NSTEMI   Medications: Scheduled:  arformoterol  15 mcg Nebulization BID   aspirin EC  81 mg Oral Daily   budesonide (PULMICORT) nebulizer solution  0.25 mg Nebulization BID   doxycycline  100 mg Oral Q12H   guaiFENesin  1,200 mg Oral BID   heparin  5,000 Units Subcutaneous Q8H   isosorbide mononitrate  30 mg Oral Daily   methylPREDNISolone (SOLU-MEDROL) injection  40 mg Intravenous Q12H   nicotine  21 mg Transdermal Daily   nystatin  5 mL Mouth/Throat QID   pantoprazole  40 mg Oral BID   polyethylene glycol  17 g Oral Daily   revefenacin  175 mcg Nebulization Daily   rosuvastatin  20 mg Oral Daily   sacubitril-valsartan  1 tablet Oral BID   senna-docusate  2 tablet Oral BID   sodium chloride flush  3 mL Intravenous Q12H   spironolactone  12.5 mg Oral Daily   Continuous:   YNW:GNFAOZHYQMVHQ **OR** acetaminophen, ALPRAZolam, benzonatate, HYDROcodone-acetaminophen, ipratropium-albuterol, nitroGLYCERIN, ondansetron **OR** ondansetron (ZOFRAN) IV, mouth rinse, sodium chloride flush  Antibiotics: Anti-infectives (From admission, onward)    Start     Dose/Rate Route Frequency Ordered Stop  09/18/23 1715  fluconazole (DIFLUCAN) tablet 150 mg        150 mg Oral  Once 09/18/23 1625 09/18/23 1658   09/17/23 2200  doxycycline (VIBRA-TABS) tablet 100 mg        100 mg Oral Every 12 hours 09/17/23 1524     09/16/23 1030  azithromycin (ZITHROMAX) 500 mg in sodium  chloride 0.9 % 250 mL IVPB  Status:  Discontinued        500 mg 250 mL/hr over 60 Minutes Intravenous Every 24 hours 09/16/23 0930 09/17/23 1524       Objective:  Vital Signs  Vitals:   09/20/23 1637 09/20/23 1924 09/21/23 0300 09/21/23 0741  BP:  139/85 (!) 142/85   Pulse:  (!) 104 74   Resp:  18 18   Temp: 98.2 F (36.8 C) 98.6 F (37 C) 98.1 F (36.7 C)   TempSrc: Oral Oral Oral   SpO2:  95% 98% 91%  Weight:      Height:        Intake/Output Summary (Last 24 hours) at 09/21/2023 0936 Last data filed at 09/21/2023 0424 Gross per 24 hour  Intake --  Output 2175 ml  Net -2175 ml   Filed Weights   09/16/23 0318 09/18/23 1815  Weight: 83 kg 84.4 kg    General appearance: Awake alert.  In no distress Resp: Good effort.  Scattered wheezing, much better compared to last 2 days.  Fewer crackles. Cardio: S1-S2 is normal regular.  No S3-S4.  No rubs murmurs or bruit GI: Abdomen is soft.  Nontender nondistended.  Bowel sounds are present normal.  No masses organomegaly Extremities: No edema.    Lab Results:  Data Reviewed: I have personally reviewed following labs and reports of the imaging studies  CBC: Recent Labs  Lab 09/16/23 0304 09/17/23 0407 09/18/23 0419 09/19/23 0509 09/19/23 1241 09/19/23 1251 09/19/23 1252 09/19/23 1434 09/20/23 0628  WBC 13.4* 11.8* 16.7* 16.7*  --   --   --  16.9* 16.8*  NEUTROABS 8.5*  --   --   --   --   --   --   --   --   HGB 13.8 13.0 14.2 15.1* 15.3* 15.3* 15.0 14.8 14.6  HCT 44.1 41.9 45.0 47.8* 45.0 45.0 44.0 45.9 45.7  MCV 90.6 90.9 90.5 89.2  --   --   --  88.6 88.6  PLT 283 270 290 271  --   --   --  259 247    Basic Metabolic Panel: Recent Labs  Lab 09/16/23 0304 09/17/23 0407 09/18/23 0419 09/19/23 0509 09/19/23 1241 09/19/23 1251 09/19/23 1252 09/19/23 1434 09/20/23 0628  NA 140 137 136 139 137 138 138  --  137  K 3.9 4.4 4.3 4.8 4.6 4.6 4.6  --  4.9  CL 103 101 98 97*  --   --   --   --  98  CO2 26  28 32 33*  --   --   --   --  30  GLUCOSE 110* 152* 204* 173*  --   --   --   --  180*  BUN 18 17 21 17   --   --   --   --  22  CREATININE 0.79 0.67 0.67 0.77  --   --   --  0.79 0.69  CALCIUM 8.6* 8.7* 8.5* 8.7*  --   --   --   --  8.4*  MG  --  2.8*  --   --   --   --   --   --   --     GFR: Estimated Creatinine Clearance: 76.7 mL/min (by C-G formula based on SCr of 0.69 mg/dL).    Recent Results (from the past 240 hours)  Resp panel by RT-PCR (RSV, Flu A&B, Covid) Anterior Nasal Swab     Status: None   Collection Time: 09/16/23  3:27 AM   Specimen: Anterior Nasal Swab  Result Value Ref Range Status   SARS Coronavirus 2 by RT PCR NEGATIVE NEGATIVE Final    Comment: (NOTE) SARS-CoV-2 target nucleic acids are NOT DETECTED.  The SARS-CoV-2 RNA is generally detectable in upper respiratory specimens during the acute phase of infection. The lowest concentration of SARS-CoV-2 viral copies this assay can detect is 138 copies/mL. A negative result does not preclude SARS-Cov-2 infection and should not be used as the sole basis for treatment or other patient management decisions. A negative result may occur with  improper specimen collection/handling, submission of specimen other than nasopharyngeal swab, presence of viral mutation(s) within the areas targeted by this assay, and inadequate number of viral copies(<138 copies/mL). A negative result must be combined with clinical observations, patient history, and epidemiological information. The expected result is Negative.  Fact Sheet for Patients:  BloggerCourse.com  Fact Sheet for Healthcare Providers:  SeriousBroker.it  This test is no t yet approved or cleared by the [place]  FDA and  has been authorized for detection and/or diagnosis of SARS-CoV-2 by FDA under an Emergency Use Authorization (EUA). This EUA will remain  in effect (meaning this test can be used) for the  duration of the COVID-19 declaration under Section 564(b)(1) of the Act, 21 U.S.C.section 360bbb-3(b)(1), unless the authorization is terminated  or revoked sooner.       Influenza A by PCR NEGATIVE NEGATIVE Final   Influenza B by PCR NEGATIVE NEGATIVE Final    Comment: (NOTE) The Xpert Xpress SARS-CoV-2/FLU/RSV plus assay is intended as an aid in the diagnosis of influenza from Nasopharyngeal swab specimens and should not be used as a sole basis for treatment. Nasal washings and aspirates are unacceptable for Xpert Xpress SARS-CoV-2/FLU/RSV testing.  Fact Sheet for Patients: BloggerCourse.com  Fact Sheet for Healthcare Providers: SeriousBroker.it  This test is not yet approved or cleared by the [place]  FDA and has been authorized for detection and/or diagnosis of SARS-CoV-2 by FDA under an Emergency Use Authorization (EUA). This EUA will remain in effect (meaning this test can be used) for the duration of the COVID-19 declaration under Section 564(b)(1) of the Act, 21 U.S.C. section 360bbb-3(b)(1), unless the authorization is terminated or revoked.     Resp Syncytial Virus by PCR NEGATIVE NEGATIVE Final    Comment: (NOTE) Fact Sheet for Patients: BloggerCourse.com  Fact Sheet for Healthcare Providers: SeriousBroker.it  This test is not yet approved or cleared by the [place]  FDA and has been authorized for detection and/or diagnosis of SARS-CoV-2 by FDA under an Emergency Use Authorization (EUA). This EUA will remain in effect (meaning this test can be used) for the duration of the COVID-19 declaration under Section 564(b)(1) of the Act, 21 U.S.C. section 360bbb-3(b)(1), unless the authorization is terminated or revoked.  Performed at [facility], [address], [place], [place] [address]       Radiology Studies: CARDIAC CATHETERIZATION Result Date:  09/19/2023 Images from the original result were not included. Dominance: Right Angiographically minimal CAD with significant catheter related spasm in  the RCA relieved with nitroglycerin. 30% ostial to proximal RCA with another focal 20% proximal RCA, and 20% proximal LAD. Hemodynamics: LV P MD BP 152/4-16 mmHg; AO P-MAP 146/78-108 mmHg RHC: RAP 14 mmHg, RV P-EDP 42/10-19 mmHg; PA-mean 40/19-28 mmHg, PCWP 28 mmHg; AO sat 93%, PA sat 66%. Fick CO-CI 4.83-2.55. RECOMMENDATIONS   Anticipated discharge date to be determined.   Patient likely needs additional diuresis Would consider long-acting vasodilator in addition to GDMT for reduced EF, based on extent of spasm.   Recommend Aspirin 81mg  daily for moderate CAD. Randene Bustard, MD      LOS: 4 days   Maylene Spear  [facility] Pager on [email or web address]  09/21/2023, 9:36 AM

## 2023-09-21 NOTE — Plan of Care (Signed)
   Problem: Education: Goal: Knowledge of General Education information will improve Description Including pain rating scale, medication(s)/side effects and non-pharmacologic comfort measures Outcome: Progressing   Problem: Health Behavior/Discharge Planning: Goal: Ability to manage health-related needs will improve Outcome: Progressing

## 2023-09-21 NOTE — Plan of Care (Signed)
 Problem: Education: Goal: Knowledge of General Education information will improve Description: Including pain rating scale, medication(s)/side effects and non-pharmacologic comfort measures 09/21/2023 1409 by Sunnie England, RN Outcome: Adequate for Discharge 09/21/2023 1409 by Sunnie England, RN Outcome: Adequate for Discharge   Problem: Health Behavior/Discharge Planning: Goal: Ability to manage health-related needs will improve 09/21/2023 1409 by Sunnie England, RN Outcome: Adequate for Discharge 09/21/2023 1409 by Sunnie England, RN Outcome: Adequate for Discharge   Problem: Clinical Measurements: Goal: Ability to maintain clinical measurements within normal limits will improve 09/21/2023 1409 by Sunnie England, RN Outcome: Adequate for Discharge 09/21/2023 1409 by Sunnie England, RN Outcome: Adequate for Discharge Goal: Will remain free from infection 09/21/2023 1409 by Sunnie England, RN Outcome: Adequate for Discharge 09/21/2023 1409 by Sunnie England, RN Outcome: Adequate for Discharge Goal: Diagnostic test results will improve 09/21/2023 1409 by Sunnie England, RN Outcome: Adequate for Discharge 09/21/2023 1409 by Sunnie England, RN Outcome: Adequate for Discharge Goal: Respiratory complications will improve 09/21/2023 1409 by Sunnie England, RN Outcome: Adequate for Discharge 09/21/2023 1409 by Sunnie England, RN Outcome: Adequate for Discharge Goal: Cardiovascular complication will be avoided 09/21/2023 1409 by Sunnie England, RN Outcome: Adequate for Discharge 09/21/2023 1409 by Sunnie England, RN Outcome: Adequate for Discharge   Problem: Activity: Goal: Risk for activity intolerance will decrease 09/21/2023 1409 by Sunnie England, RN Outcome: Adequate for Discharge 09/21/2023 1409 by Sunnie England, RN Outcome: Adequate for Discharge   Problem: Nutrition: Goal: Adequate nutrition will be  maintained 09/21/2023 1409 by Sunnie England, RN Outcome: Adequate for Discharge 09/21/2023 1409 by Sunnie England, RN Outcome: Adequate for Discharge   Problem: Coping: Goal: Level of anxiety will decrease 09/21/2023 1409 by Sunnie England, RN Outcome: Adequate for Discharge 09/21/2023 1409 by Sunnie England, RN Outcome: Adequate for Discharge   Problem: Elimination: Goal: Will not experience complications related to bowel motility 09/21/2023 1409 by Sunnie England, RN Outcome: Adequate for Discharge 09/21/2023 1409 by Sunnie England, RN Outcome: Adequate for Discharge Goal: Will not experience complications related to urinary retention 09/21/2023 1409 by Sunnie England, RN Outcome: Adequate for Discharge 09/21/2023 1409 by Sunnie England, RN Outcome: Adequate for Discharge   Problem: Pain Managment: Goal: General experience of comfort will improve and/or be controlled 09/21/2023 1409 by Sunnie England, RN Outcome: Adequate for Discharge 09/21/2023 1409 by Sunnie England, RN Outcome: Adequate for Discharge   Problem: Safety: Goal: Ability to remain free from injury will improve 09/21/2023 1409 by Sunnie England, RN Outcome: Adequate for Discharge 09/21/2023 1409 by Sunnie England, RN Outcome: Adequate for Discharge   Problem: Skin Integrity: Goal: Risk for impaired skin integrity will decrease 09/21/2023 1409 by Sunnie England, RN Outcome: Adequate for Discharge 09/21/2023 1409 by Sunnie England, RN Outcome: Adequate for Discharge   Problem: Education: Goal: Understanding of CV disease, CV risk reduction, and recovery process will improve 09/21/2023 1409 by Sunnie England, RN Outcome: Adequate for Discharge 09/21/2023 1409 by Sunnie England, RN Outcome: Adequate for Discharge Goal: Individualized Educational Video(s) 09/21/2023 1409 by Sunnie England, RN Outcome: Adequate for Discharge 09/21/2023 1409 by Sunnie England, RN Outcome: Adequate for Discharge   Problem: Activity: Goal: Ability to return to baseline activity level will improve 09/21/2023 1409 by Sunnie England, RN Outcome: Adequate for Discharge 09/21/2023 1409 by Sunnie England, RN Outcome: Adequate for  Discharge   Problem: Cardiovascular: Goal: Ability to achieve and maintain adequate cardiovascular perfusion will improve 09/21/2023 1409 by Sunnie England, RN Outcome: Adequate for Discharge 09/21/2023 1409 by Sunnie England, RN Outcome: Adequate for Discharge Goal: Vascular access site(s) Level 0-1 will be maintained 09/21/2023 1409 by Sunnie England, RN Outcome: Adequate for Discharge 09/21/2023 1409 by Sunnie England, RN Outcome: Adequate for Discharge   Problem: Health Behavior/Discharge Planning: Goal: Ability to safely manage health-related needs after discharge will improve 09/21/2023 1409 by Sunnie England, RN Outcome: Adequate for Discharge 09/21/2023 1409 by Sunnie England, RN Outcome: Adequate for Discharge

## 2023-09-22 ENCOUNTER — Encounter (HOSPITAL_COMMUNITY): Payer: Self-pay | Admitting: Cardiology

## 2023-09-22 LAB — LIPOPROTEIN A (LPA): Lipoprotein (a): <8.4 nmol/L (ref <75.0)

## 2023-10-12 NOTE — Progress Notes (Deleted)
 Cardiology Office Note    Patient Name: Erin Hobbs [facility] Date of Encounter: 10/12/2023  Primary Care Provider:  Chares Commons, PA-C Primary Cardiologist:  Ola Berger, MD Primary Electrophysiologist: None   Past Medical History    Past Medical History:  Diagnosis Date   Arthritis    COPD (chronic obstructive pulmonary disease) (HCC)    Ulcer     History of Present Illness  Erin Hobbs is a 63 y.o. female with a PMH of CAD s/p NSTEMI with LHC (no significant CAD), NICM tobacco abuse, COPD, obesity, GERD presents today for posthospital follow-up.  Erin Hobbs was seen in the ED at [facility] via EMS on 09/16/23 with complaint of shortness of breath with wheezing.  She reported difficulty sleeping flat x 4 days feeling suffocated.  She noted CP described as intermittent crampy and pressure x 1 week, 4/10, lasting seconds, exacerbated by stress, no exertional CP, no radiation, non-pleuritic, numbness in fingers and face. She was treated with 2 breathing treatments along with steroids and magnesium . HsTn drawn 40 > 1180 >1249 and patient was started on heparin  drip and evaluated by cardiology.  2D echo was completed showing mildly depressed EF with hypokinesis of the distal lateral septal and apical walls.  She was transferred to [facility] and underwent a R/LHC due to ongoing complaint of chest pain and elevated troponin that showed minimal CAD with significant catheter related spasm in the RCA treated with nitroglycerin  and 30% ostial to proximal RCA, 30% proximal LAD with recommendation of GDMT and ASA 81 mg for moderate CAD.  She was discharged on 09/21/23 with Imdur  30 mg, ASA 81 mg, GDMT for CHF with Entresto , spironolactone , Jardiance.  Patient denies chest pain, palpitations, dyspnea, PND, orthopnea, nausea, vomiting, dizziness, syncope, edema, weight gain, or early satiety.   Discussed the use of AI scribe software for clinical note transcription with the patient, who gave verbal  consent to proceed.  History of Present Illness    ***Notes BB deferred due to lung history -Recently started on Imdur  30 mg daily for coronary spasm -Last ischemic evaluation:  Review of Systems  Please see the history of present illness.    All other systems reviewed and are otherwise negative except as noted above.  Physical Exam    Wt Readings from Last 3 Encounters:  09/18/23 186 lb (84.4 kg)  08/16/23 182 lb (82.6 kg)  09/04/22 175 lb 14.8 oz (79.8 kg)   RU:EAVWU were no vitals filed for this visit.,There is no height or weight on file to calculate BMI. GEN: Well nourished, well developed in no acute distress Neck: No JVD; No carotid bruits Pulmonary: Clear to auscultation without rales, wheezing or rhonchi  Cardiovascular: Normal rate. Regular rhythm. Normal S1. Normal S2.   Murmurs: There is no murmur.  ABDOMEN: Soft, non-tender, non-distended EXTREMITIES:  No edema; No deformity   EKG/LABS/ Recent Cardiac Studies   ECG personally reviewed by me today - ***  Risk Assessment/Calculations:   {Does this patient have ATRIAL FIBRILLATION?:[id number]}      Lab Results  Component Value Date   WBC 16.8 (H) 09/20/2023   HGB 14.6 09/20/2023   HCT 45.7 09/20/2023   MCV 88.6 09/20/2023   PLT 247 09/20/2023   Lab Results  Component Value Date   CREATININE 0.88 09/21/2023   BUN 25 (H) 09/21/2023   NA 137 09/21/2023   K 4.8 09/21/2023   CL 96 (L) 09/21/2023   CO2 31 09/21/2023   Lab Results  Component Value Date   CHOL 170 09/16/2023   HDL 66 09/16/2023   LDLCALC 96 09/16/2023   TRIG 38 09/16/2023   CHOLHDL 2.6 09/16/2023    Lab Results  Component Value Date   HGBA1C 5.9 (H) 09/16/2023   Assessment & Plan    1.  HFrEF/NICM:  2.  NSTEMI:  3.  History of COPD:  4.  Essential hypertension:  5.  Hyperlipidemia:  6.  Tobacco abuse:  {The patient has an active order for outpatient cardiac rehabilitation.   Please indicate if the patient is ready to  start. Do NOT delete this.  It will auto delete.  Refresh note, then sign.              Click here to document readiness and see contraindications.  :1}  Cardiac Rehabilitation Eligibility Assessment       Disposition: Follow-up with Ola Berger, MD or APP in *** months {Are you ordering a CV Procedure (e.g. stress test, cath, DCCV, TEE, etc)?   Press F2        :[id number]}   Signed, Francene Ing, Retha Cast, NP 10/12/2023, 9:56 AM [facility]

## 2023-10-13 ENCOUNTER — Ambulatory Visit: Payer: Self-pay | Attending: Nurse Practitioner | Admitting: Nurse Practitioner

## 2023-10-13 DIAGNOSIS — I428 Other cardiomyopathies: Secondary | ICD-10-CM

## 2023-10-13 DIAGNOSIS — Z720 Tobacco use: Secondary | ICD-10-CM

## 2023-10-13 DIAGNOSIS — I10 Essential (primary) hypertension: Secondary | ICD-10-CM

## 2023-10-13 DIAGNOSIS — E785 Hyperlipidemia, unspecified: Secondary | ICD-10-CM

## 2023-10-13 DIAGNOSIS — J441 Chronic obstructive pulmonary disease with (acute) exacerbation: Secondary | ICD-10-CM

## 2023-10-13 DIAGNOSIS — I5020 Unspecified systolic (congestive) heart failure: Secondary | ICD-10-CM

## 2023-10-13 DIAGNOSIS — I2510 Atherosclerotic heart disease of native coronary artery without angina pectoris: Secondary | ICD-10-CM

## 2023-11-02 NOTE — Progress Notes (Deleted)
 Erin Hobbs, female    DOB: June 14, 1960    MRN: [id number]   Brief patient profile:  63  yo*** *** referred to pulmonary clinic in [place]  11/05/2023 by *** for ***   Pt not previously seen by PCCM service.     History of Present Illness  11/05/2023  Pulmonary/ 1st office eval/ Erin Hobbs / [facility]  No chief complaint on file.    Dyspnea:  *** Cough: *** Sleep: *** SABA use: *** 02: *** LDSCT:***  No obvious day to day or daytime pattern/variability or assoc excess/ purulent sputum or mucus plugs or hemoptysis or cp or chest tightness, subjective wheeze or overt sinus or hb symptoms.    Also denies any obvious fluctuation of symptoms with weather or environmental changes or other aggravating or alleviating factors except as outlined above   No unusual exposure hx or h/o childhood pna/ asthma or knowledge of premature birth.  Current Allergies, Complete Past Medical History, Past Surgical History, Family History, and Social History were reviewed in [employer] record.  ROS  The following are not active complaints unless bolded Hoarseness, sore throat, dysphagia, dental problems, itching, sneezing,  nasal congestion or discharge of excess mucus or purulent secretions, ear ache,   fever, chills, sweats, unintended wt loss or wt gain, classically pleuritic or exertional cp,  orthopnea pnd or arm/hand swelling  or leg swelling, presyncope, palpitations, abdominal pain, anorexia, nausea, vomiting, diarrhea  or change in bowel habits or change in bladder habits, change in stools or change in urine, dysuria, hematuria,  rash, arthralgias, visual complaints, headache, numbness, weakness or ataxia or problems with walking or coordination,  change in mood or  memory.            Outpatient Medications Prior to Visit  Medication Sig Dispense Refill   acetaminophen  (TYLENOL ) 500 MG tablet Take 1,000 mg by mouth every 6 (six) hours as needed for fever or mild  pain (pain score 1-3).     ALPRAZolam  (XANAX ) 0.25 MG tablet Take 0.25 mg by mouth at bedtime as needed for sleep.     aspirin  EC 81 MG tablet Take 1 tablet (81 mg total) by mouth daily. Swallow whole. 30 tablet 12   benzonatate  (TESSALON ) 100 MG capsule Take 1 capsule (100 mg total) by mouth 2 (two) times daily as needed for cough. 20 capsule 0   budesonide -formoterol  (SYMBICORT) 160-4.5 MCG/ACT inhaler Inhale 2 puffs into the lungs 2 (two) times daily.     dapagliflozin  propanediol (FARXIGA ) 10 MG TABS tablet Take 1 tablet (10 mg total) by mouth daily before breakfast. 30 tablet 2   furosemide  (LASIX ) 20 MG tablet Take 1 tablet (20 mg total) by mouth daily as needed for fluid (for 3lbs weight gain in 1 day or 5lbs weight gain over a week.). 30 tablet 2   HYDROcodone -acetaminophen  (NORCO) 10-325 MG tablet Take 1 tablet by mouth 2 (two) times daily as needed for moderate pain (pain score 4-6).     Ipratropium-Albuterol  (COMBIVENT) 20-100 MCG/ACT AERS respimat Inhale 1 puff into the lungs every 6 (six) hours. 4 g 2   ipratropium-albuterol  (DUONEB) 0.5-2.5 (3) MG/3ML SOLN Take 3 mLs by nebulization every 6 (six) hours as needed (SOB/wheezing not improved with the use of inhaler.). 360 mL 1   isosorbide  mononitrate (IMDUR ) 30 MG 24 hr tablet Take 1 tablet (30 mg total) by mouth daily. 30 tablet 2   omeprazole (PRILOSEC) 40 MG capsule Take 40 mg by mouth daily.  oxybutynin (DITROPAN) 5 MG tablet Take 5 mg by mouth 2 (two) times daily as needed for bladder spasms. (Patient not taking: Reported on 09/16/2023)     predniSONE  (DELTASONE ) 20 MG tablet Take 3 tablets once daily for 3 days followed by 2 tablets once daily for 3 days followed by 1 tablet once daily for 3 days and then stop 18 tablet 0   rosuvastatin  (CRESTOR ) 20 MG tablet Take 1 tablet (20 mg total) by mouth daily. 30 tablet 2   sacubitril -valsartan  (ENTRESTO ) 24-26 MG Take 1 tablet by mouth 2 (two) times daily. 60 tablet 1   spironolactone   (ALDACTONE ) 25 MG tablet Take 0.5 tablets (12.5 mg total) by mouth daily. 15 tablet 1   VENTOLIN  HFA 108 (90 Base) MCG/ACT inhaler Inhale 2 puffs into the lungs every 4 (four) hours as needed for wheezing or shortness of breath.     Vitamin D, Ergocalciferol, (DRISDOL) 1.25 MG (50000 UNIT) CAPS capsule Take 50,000 Units by mouth every Sunday.     No facility-administered medications prior to visit.    Past Medical History:  Diagnosis Date   Arthritis    COPD (chronic obstructive pulmonary disease) (HCC)    Ulcer       Objective:     There were no vitals taken for this visit.         Assessment   No problem-specific Assessment & Plan notes found for this encounter.     Ozell America, MD 11/02/2023

## 2023-11-05 ENCOUNTER — Encounter: Payer: Self-pay | Admitting: Internal Medicine

## 2023-11-05 ENCOUNTER — Ambulatory Visit: Admitting: Internal Medicine

## 2024-02-10 ENCOUNTER — Other Ambulatory Visit: Payer: Medicaid Other

## 2024-06-10 ENCOUNTER — Encounter (HOSPITAL_COMMUNITY): Payer: Self-pay

## 2024-06-10 ENCOUNTER — Emergency Department (HOSPITAL_COMMUNITY)
Admission: EM | Admit: 2024-06-10 | Discharge: 2024-06-10 | Disposition: A | Discharge status: Home / Self Care | Attending: Emergency Medicine | Admitting: Emergency Medicine

## 2024-06-10 ENCOUNTER — Other Ambulatory Visit: Payer: Self-pay

## 2024-06-10 DIAGNOSIS — R220 Localized swelling, mass and lump, head: Secondary | ICD-10-CM | POA: Insufficient documentation

## 2024-06-10 DIAGNOSIS — B370 Candidal stomatitis: Secondary | ICD-10-CM | POA: Insufficient documentation

## 2024-06-10 DIAGNOSIS — L500 Allergic urticaria: Secondary | ICD-10-CM | POA: Insufficient documentation

## 2024-06-10 DIAGNOSIS — J449 Chronic obstructive pulmonary disease, unspecified: Secondary | ICD-10-CM | POA: Insufficient documentation

## 2024-06-10 DIAGNOSIS — T7800XA Anaphylactic reaction due to unspecified food, initial encounter: Secondary | ICD-10-CM | POA: Insufficient documentation

## 2024-06-10 DIAGNOSIS — Z7951 Long term (current) use of inhaled steroids: Secondary | ICD-10-CM | POA: Insufficient documentation

## 2024-06-10 DIAGNOSIS — Z7982 Long term (current) use of aspirin: Secondary | ICD-10-CM | POA: Insufficient documentation

## 2024-06-10 DIAGNOSIS — Z7952 Long term (current) use of systemic steroids: Secondary | ICD-10-CM | POA: Insufficient documentation

## 2024-06-10 MED ORDER — NYSTATIN 100000 UNIT/ML MT SUSP: 500000.0000 [IU] | Freq: Four times a day (QID) | OROMUCOSAL | 0 refills | Status: AC

## 2024-06-10 MED ORDER — FAMOTIDINE 20 MG PO TABS: 20.0000 mg | ORAL_TABLET | Freq: Two times a day (BID) | ORAL | 0 refills | Status: AC

## 2024-06-10 MED ORDER — DIPHENHYDRAMINE HCL 50 MG/ML IJ SOLN
12.5000 mg | Freq: Once | INTRAMUSCULAR | Status: AC
  Administered 2024-06-10: 12.5 mg via INTRAVENOUS
  Filled 2024-06-10: qty 1

## 2024-06-10 MED ORDER — SODIUM CHLORIDE 0.9 % IV BOLUS
500.0000 mL | Freq: Once | INTRAVENOUS | Status: AC
  Administered 2024-06-10: 500 mL via INTRAVENOUS

## 2024-06-10 MED ORDER — METHYLPREDNISOLONE SODIUM SUCC 125 MG IJ SOLR
125.0000 mg | Freq: Once | INTRAMUSCULAR | Status: AC
  Administered 2024-06-10: 125 mg via INTRAVENOUS
  Filled 2024-06-10: qty 2

## 2024-06-10 MED ORDER — FAMOTIDINE IN NACL 20-0.9 MG/50ML-% IV SOLN
20.0000 mg | Freq: Once | INTRAVENOUS | Status: AC
  Administered 2024-06-10: 20 mg via INTRAVENOUS
  Filled 2024-06-10: qty 50

## 2024-06-10 MED ORDER — METHYLPREDNISOLONE 4 MG PO TBPK: ORAL_TABLET | ORAL | 0 refills | Status: AC

## 2024-06-10 MED ORDER — EPINEPHRINE 0.3 MG/0.3ML IJ SOAJ: 0.3000 mg | INTRAMUSCULAR | 0 refills | Status: AC | PRN

## 2024-06-10 NOTE — ED Notes (Signed)
 Pt states she has COPD, became SOB after walking back to the room, states she uses 2L of O2 at home periodically. O2 sat dropped to 90% after walking, placed on 2L in the room.

## 2024-06-10 NOTE — ED Triage Notes (Signed)
 Pt arrived via POV c/o allergic reaction that began after eating at a chinese buffet yesterday. Pt reports last taking Benadryl  at 1000 today. Pt presents with full body rash and swelling. Pt denies SOB.

## 2024-06-10 NOTE — ED Notes (Addendum)
 1lpm O2 [place], normally wears at home.

## 2024-06-10 NOTE — Discharge Instructions (Addendum)
 ### Understanding Your Allergic Reaction and Treatment Plan   You had an allergic reaction and thrush. Rember to wash out your mouth every time you use your inhaled steroid.  ## What Happened to You?        You experienced a severe allergic reaction (also called an anaphylactoid reaction) that caused hives all over your body. This type of reaction happens when your immune system overreacts to something you ate, though we don't yet know what triggered it. These reactions can range from mild to life-threatening, which is why we treated you promptly and are providing you with important medications and instructions.      ## Your Medications        You have been prescribed several medications to help manage your symptoms and protect you in the future:      **Benadryl  (diphenhydramine )** - Take as needed for itching or hives. This medication may cause drowsiness, so avoid driving or operating machinery after taking it.      **Pepcid  (famotidine )** - This medication blocks a different type of histamine receptor and can help reduce hives and itching. It works well in combination with Benadryl  and typically does not cause drowsiness.[1][2]      **Medrol  Dose Pack (methylprednisolone )** - This is a steroid that helps reduce inflammation and may help prevent symptoms from returning. Follow the instructions on the package carefully, taking the pills exactly as directed over the next several days.[3]      **EpiPen  (epinephrine  auto-injector)** - This is your emergency rescue medication. You should carry it with you at all times.      ## When to Use Your EpiPen         **Use your EpiPen  immediately if you experience any of these symptoms:**      - Difficulty breathing, wheezing, or throat tightness      - Swelling of the tongue, lips, or throat      - Dizziness, fainting, or feeling like you might pass out      - Severe or rapidly worsening hives      - Nausea, vomiting, or abdominal cramping  along with other symptoms      - A feeling that something is seriously wrong      **How to use your EpiPen :**      1. Remove the EpiPen  from its carrier tube      2. Hold it firmly with the orange tip pointing downward      3. Remove the blue safety cap by pulling straight up      4. Swing and push the orange tip firmly into the outer thigh (you can inject through clothing if needed)      5. Hold in place for 3 seconds      6. Remove and massage the injection area for 10 seconds      **After using your EpiPen , call 911 immediately.** Epinephrine  is the most important treatment for severe allergic reactions, but you still need emergency medical care even if you feel better.[4][3][5][6] Some reactions can return hours later (called a biphasic reaction), so you need to be monitored in a hospital.[6][7]      You should have been prescribed **two EpiPens** so you have a backup dose if needed. About 1 in 10 severe reactions require more than one dose of epinephrine .[8]      ## Important Safety Information        - **Carry your EpiPen  everywhere.** Keep it at room temperature and replace it  before the expiration date.      - **Tell family and friends** about your allergy and show them how to use your EpiPen  in case you need help.      - **Wear medical alert identification** (bracelet or necklace) that states you have a severe allergy.      - **Do not delay using your EpiPen ** if you think you're having a severe reaction. It is safe to use even if you're not completely sure, and early use can prevent the reaction from becoming life-threatening.[6]      - There are no absolute contraindications to using epinephrine  for a severe allergic reaction.[6]      ## Follow-Up with an Allergist        **It is very important that you see an allergist within the next few weeks.** The allergist will:      - Perform testing to identify what triggered your reaction      - Develop a personalized  emergency action plan      - Provide education on avoiding triggers      - Discuss whether you might benefit from other treatments      - Ensure you know how to properly use your EpiPen       Identifying your trigger is essential to preventing future reactions. Common food triggers include peanuts, tree nuts, shellfish, fish, eggs, milk, soy, and wheat, but many other foods can cause reactions.[3][6]      ## When to Seek Emergency Care        **Call 911 or go to the emergency room immediately if:**      - You use your EpiPen       - You develop difficulty breathing, throat swelling, or dizziness      - Your symptoms are getting worse despite taking your medications      - You develop new symptoms within 24 hours of your initial reaction      ## Questions?        If you have questions about your medications or treatment plan, contact your doctor. If you are experiencing symptoms of a severe allergic reaction, do not wait--use your EpiPen  and call 911.      ### References  1. Histamine H2-Receptor Antagonists for Urticaria. Fedorowicz Z, van Zuuren EJ, Hu N. The Cochrane Database of Systematic Reviews. 2012;(3):CD008596. doi:10.1002/14651858.RI991403.ela7. 2. The Diagnosis and Management of Acute and Chronic Urticaria: 2014 Update. Thornell SHILLING, Lang DM, Fernand ISLE, et al. The Journal of Allergy and Clinical Immunology. 2014;133(5):1270-7. doi:10.1016/j.jaci.2014.02.036. 3. Guidelines for the Diagnosis and Management of Food Allergy in the [place] : Report of the NIAID-sponsored Expert Panel. Lavina SHILLING, Assa'ad A, Burks AW, et al. The Journal of Allergy and Clinical Immunology. 7989;873(3 Suppl):S1-58. doi:10.1016/j.jaci.2010.10.007. 4. Management of Food Allergies and Food-Related Anaphylaxis. Maudry LUTRICIA Casilda CHRISTELLA, Virkud YV, Iweala OI. JAMA. 2024;331(6):510-521. doi:10.1001/jama.7976.73142. 5. Epinephrine . Food and Drug Administration. Updated date: 2021-07-25. 6. Anaphylaxis:  Recognition and Management. Pflipsen MC, Vega Colon KM. American Family Physician. 2020;102(6):355-362. 7. [employer] Clinical Practice Guidelines on Anaphylaxis. Gaudio FG, Johnson DE, DiLorenzo K, et al. [employer]. 2022;33(1):75-91. doi:10.1016/j.wem.2021.11.009. 8. 2024 American Heart Association and American Red Cross Guidelines for First Aid. Hewett Brumberg EK, Douma MJ, Alibertis K, et al. Circulation. 2024;150(24):e519-e579. doi:10.1161/CIR.0000000000001281.

## 2024-06-10 NOTE — ED Provider Notes (Addendum)
 " [place] EMERGENCY DEPARTMENT AT [facility] Provider Note   CSN: [id number] Arrival date & time: 06/10/24  1244     Patient presents with: Allergic Reaction   Erin Hobbs is a 64 y.o. female with multiple COPD, allergy to (describes as feeling like I am going to pass out.  She presents to the emergency department today with complaints of fall.  Patient reports that yesterday she went out to eat with her son at a party birthday.  She ate mussels and states she.  Immediately after eating.  The foods she broke out into diffuse positive forabdomen Soft and Normal.  She Had a Little Bit of Swelling to Her Lip.  She Denies Any Increased Shortness of Breath, Nausea, Vomiting, Diarrhea, Abdominal Pain or Loss of Consciousness.  He Has a Previous History of Anaphylaxis.  He Is on Baseline 2 L for Her History of COPD.    Allergic Reaction      Prior to Admission medications  Medication Sig Start Date End Date Taking? Authorizing Provider  acetaminophen  (TYLENOL ) 500 MG tablet Take 1,000 mg by mouth every 6 (six) hours as needed for fever or mild pain (pain score 1-3).    [provider]  ALPRAZolam  (XANAX ) 0.25 MG tablet Take 0.25 mg by mouth at bedtime as needed for sleep. 09/13/23   [provider]  aspirin  EC 81 MG tablet Take 1 tablet (81 mg total) by mouth daily. Swallow whole. 09/22/23   Krishnan, Gokul, MD  benzonatate  (TESSALON ) 100 MG capsule Take 1 capsule (100 mg total) by mouth 2 (two) times daily as needed for cough. 08/16/23   Roemhildt, Lorin T, PA-C  budesonide -formoterol  (SYMBICORT) 160-4.5 MCG/ACT inhaler Inhale 2 puffs into the lungs 2 (two) times daily.    [provider]  dapagliflozin  propanediol (FARXIGA ) 10 MG TABS tablet Take 1 tablet (10 mg total) by mouth daily before breakfast. 09/21/23   Verdene Purchase, MD  furosemide  (LASIX ) 20 MG tablet Take 1 tablet (20 mg total) by mouth daily as needed for fluid (for 3lbs weight gain in 1 day  or 5lbs weight gain over a week.). 09/21/23 09/20/24  Krishnan, Gokul, MD  HYDROcodone -acetaminophen  [facility]) 10-325 MG tablet Take 1 tablet by mouth 2 (two) times daily as needed for moderate pain (pain score 4-6). 09/13/23   [provider]  Ipratropium-Albuterol  (COMBIVENT) 20-100 MCG/ACT AERS respimat Inhale 1 puff into the lungs every 6 (six) hours. 02/23/20   Ricky Fines, MD  ipratropium-albuterol  (DUONEB) 0.5-2.5 (3) MG/3ML SOLN Take 3 mLs by nebulization every 6 (six) hours as needed (SOB/wheezing not improved with the use of inhaler.). 02/23/20   Ricky Fines, MD  isosorbide  mononitrate (IMDUR ) 30 MG 24 hr tablet Take 1 tablet (30 mg total) by mouth daily. 09/22/23   Krishnan, Gokul, MD  omeprazole (PRILOSEC) 40 MG capsule Take 40 mg by mouth daily. 06/02/22   [provider]  oxybutynin (DITROPAN) 5 MG tablet Take 5 mg by mouth 2 (two) times daily as needed for bladder spasms. Patient not taking: Reported on 09/16/2023 06/07/23   [provider]  predniSONE  (DELTASONE ) 20 MG tablet Take 3 tablets once daily for 3 days followed by 2 tablets once daily for 3 days followed by 1 tablet once daily for 3 days and then stop 09/21/23   Krishnan, Gokul, MD  rosuvastatin  (CRESTOR ) 20 MG tablet Take 1 tablet (20 mg total) by mouth daily. 09/22/23   Krishnan, Gokul, MD  sacubitril -valsartan  (ENTRESTO ) 24-26 MG Take  1 tablet by mouth 2 (two) times daily. 09/21/23   Krishnan, Gokul, MD  spironolactone  (ALDACTONE ) 25 MG tablet Take 0.5 tablets (12.5 mg total) by mouth daily. 09/22/23   Krishnan, Gokul, MD  VENTOLIN  HFA 108 [phone] Base) MCG/ACT inhaler Inhale 2 puffs into the lungs every 4 (four) hours as needed for wheezing or shortness of breath. 08/04/22   [provider]  Vitamin D, Ergocalciferol, (DRISDOL) 1.25 MG (50000 UNIT) CAPS capsule Take 50,000 Units by mouth every Sunday. 02/07/20   [provider]    Allergies: Aspirin  and Mushroom extract complex (obsolete)     Review of Systems  Updated Vital Signs BP 134/89   Pulse (!) 102   Temp (!) 97.5 F (36.4 C)   Resp 17   SpO2 97%   Physical Exam Vitals and nursing note reviewed.  Constitutional:      General: She is not in acute distress.    Appearance: She is well-developed. She is not diaphoretic.     Interventions: Nasal cannula in place.  HENT:     Head: Normocephalic and atraumatic.     Right Ear: External ear normal.     Left Ear: External ear normal.     Nose: Nose normal.     Mouth/Throat:     Mouth: Mucous membranes are moist.  Eyes:     General: No scleral icterus.    Conjunctiva/sclera: Conjunctivae normal.  Cardiovascular:     Rate and Rhythm: Normal rate and regular rhythm.     Heart sounds: Normal heart sounds. No murmur heard.    No friction rub. No gallop.  Pulmonary:     Effort: Pulmonary effort is normal. No respiratory distress.     Breath sounds: Wheezing present.     Comments: Mild expiratory wheeze, no decreased air movement Abdominal:     General: Bowel sounds are normal. There is no distension.     Palpations: Abdomen is soft. There is no mass.     Tenderness: There is no abdominal tenderness. There is no guarding.  Musculoskeletal:     Cervical back: Normal range of motion.  Skin:    General: Skin is warm and dry.     Comments: Diffuse hives visible but no, slight swelling at the bottom of her left lower lip.  No oral swelling.  Incidental finding of erythema over the palate and uvula with diffuse white exudate consistent with oral candidiasis.  Neurological:     Mental Status: She is alert and oriented to person, place, and time.  Psychiatric:        Behavior: Behavior normal.     (all labs ordered are listed, but only abnormal results are displayed) Labs Reviewed - No data to display  EKG: None  Radiology: No results found.   Procedures   Medications Ordered in the ED - No data to display                                  Medical  Decision Making Risk Prescription drug management.   Patient reevaluated at bedside.  Her hives have improved significantly. Patient had diffuse hives after eating a variety of foods. Known hx of mushroom allergy, but no known hx of anaphylaxis.  Symptoms are anaphylactoid in nature with diffuse hives however patient has not had any airway compromise, nausea, vomiting, diarrhea, abdominal pain, or near syncope. Treated in the emergency department with Solu-Medrol , Benadryl , and Pepcid   with significant improvement in her hives. Also appears to have oral thrush. She admits that she does not  At this time patient be discharged with EpiPen , Medrol  pack, daily Pepcid , she has an established relationship in the past with allergist however I will give her referral to 1 in [place] and she has a PCP follow-up tomorrow morning.  Will discussed reasons to seek immediate medical care, reasons to take her EpiPen .  Otherwise appropriate for discharge at this time.     Final diagnoses:  None    ED Discharge Orders     None          Arloa Chroman, PA-C 06/10/24 1452    Arloa Chroman, PA-C 06/10/24 1531    Yolande Lamar BROCKS, MD 06/16/24 [phone]  "
